# Patient Record
Sex: Female | Born: 1960
Health system: Southern US, Community
[De-identification: ages and names within clinical notes are randomized; demographics above are authoritative.]

## PROBLEM LIST (undated history)

## (undated) DIAGNOSIS — M1990 Unspecified osteoarthritis, unspecified site: Secondary | ICD-10-CM

## (undated) DIAGNOSIS — E079 Disorder of thyroid, unspecified: Secondary | ICD-10-CM

## (undated) DIAGNOSIS — N390 Urinary tract infection, site not specified: Secondary | ICD-10-CM

## (undated) DIAGNOSIS — G629 Polyneuropathy, unspecified: Secondary | ICD-10-CM

## (undated) DIAGNOSIS — C50919 Malignant neoplasm of unspecified site of unspecified female breast: Secondary | ICD-10-CM

## (undated) DIAGNOSIS — J40 Bronchitis, not specified as acute or chronic: Secondary | ICD-10-CM

## (undated) DIAGNOSIS — K219 Gastro-esophageal reflux disease without esophagitis: Secondary | ICD-10-CM

## (undated) HISTORY — DX: Urinary tract infection, site not specified: N39.0

## (undated) HISTORY — PX: TONSILLECTOMY: SUR1361

## (undated) HISTORY — DX: Disorder of thyroid, unspecified: E07.9

## (undated) HISTORY — PX: URETHRAL STRICTURE DILATATION: SHX477

---
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
## 1999-05-06 ENCOUNTER — Other Ambulatory Visit: Admission: RE | Admit: 1999-05-06 | Discharge: 1999-05-06 | Payer: Self-pay | Admitting: Obstetrics and Gynecology

## 1999-09-24 ENCOUNTER — Ambulatory Visit (HOSPITAL_COMMUNITY): Admission: RE | Admit: 1999-09-24 | Discharge: 1999-09-24 | Payer: Self-pay | Admitting: Obstetrics and Gynecology

## 1999-11-29 ENCOUNTER — Inpatient Hospital Stay (HOSPITAL_COMMUNITY): Admission: AD | Admit: 1999-11-29 | Discharge: 1999-12-01 | Payer: Self-pay | Admitting: Obstetrics and Gynecology

## 2000-01-03 ENCOUNTER — Other Ambulatory Visit: Admission: RE | Admit: 2000-01-03 | Discharge: 2000-01-03 | Payer: Self-pay | Admitting: Obstetrics and Gynecology

## 2000-07-10 ENCOUNTER — Other Ambulatory Visit: Admission: RE | Admit: 2000-07-10 | Discharge: 2000-07-10 | Payer: Self-pay | Admitting: Obstetrics and Gynecology

## 2001-01-23 ENCOUNTER — Other Ambulatory Visit: Admission: RE | Admit: 2001-01-23 | Discharge: 2001-01-23 | Payer: Self-pay | Admitting: Obstetrics and Gynecology

## 2002-01-28 ENCOUNTER — Other Ambulatory Visit: Admission: RE | Admit: 2002-01-28 | Discharge: 2002-01-28 | Payer: Self-pay | Admitting: Obstetrics and Gynecology

## 2002-12-13 ENCOUNTER — Other Ambulatory Visit: Admission: RE | Admit: 2002-12-13 | Discharge: 2002-12-13 | Payer: Self-pay | Admitting: Obstetrics and Gynecology

## 2004-01-26 ENCOUNTER — Other Ambulatory Visit: Admission: RE | Admit: 2004-01-26 | Discharge: 2004-01-26 | Payer: Self-pay | Admitting: Obstetrics and Gynecology

## 2005-02-24 ENCOUNTER — Other Ambulatory Visit: Admission: RE | Admit: 2005-02-24 | Discharge: 2005-02-24 | Payer: Self-pay | Admitting: Obstetrics and Gynecology

## 2005-03-09 ENCOUNTER — Encounter: Admission: RE | Admit: 2005-03-09 | Discharge: 2005-03-09 | Payer: Self-pay | Admitting: Obstetrics and Gynecology

## 2005-06-08 ENCOUNTER — Ambulatory Visit: Payer: Self-pay | Admitting: Internal Medicine

## 2007-05-11 ENCOUNTER — Encounter: Admission: RE | Admit: 2007-05-11 | Discharge: 2007-05-11 | Payer: Self-pay | Admitting: Obstetrics and Gynecology

## 2007-06-07 HISTORY — PX: SALIVARY STONE REMOVAL: SHX5213

## 2007-06-11 ENCOUNTER — Encounter: Admission: RE | Admit: 2007-06-11 | Discharge: 2007-06-11 | Payer: Self-pay | Admitting: Obstetrics and Gynecology

## 2007-06-11 ENCOUNTER — Encounter (INDEPENDENT_AMBULATORY_CARE_PROVIDER_SITE_OTHER): Payer: Self-pay | Admitting: Diagnostic Radiology

## 2007-08-03 ENCOUNTER — Ambulatory Visit: Payer: Self-pay | Admitting: Internal Medicine

## 2007-08-03 DIAGNOSIS — R220 Localized swelling, mass and lump, head: Secondary | ICD-10-CM | POA: Insufficient documentation

## 2007-08-03 DIAGNOSIS — F329 Major depressive disorder, single episode, unspecified: Secondary | ICD-10-CM

## 2007-08-03 DIAGNOSIS — R221 Localized swelling, mass and lump, neck: Secondary | ICD-10-CM

## 2007-09-03 ENCOUNTER — Telehealth (INDEPENDENT_AMBULATORY_CARE_PROVIDER_SITE_OTHER): Payer: Self-pay | Admitting: *Deleted

## 2010-06-27 ENCOUNTER — Encounter: Payer: Self-pay | Admitting: Obstetrics and Gynecology

## 2010-07-14 ENCOUNTER — Encounter: Payer: Self-pay | Admitting: Internal Medicine

## 2010-07-28 NOTE — Consult Note (Signed)
Summary: [facility]  [facility]   Imported By: Maryln Gottron 07/20/2010 09:38:25  _____________________________________________________________________  External Attachment:    Type:   Image     Comment:   External Document

## 2011-08-09 ENCOUNTER — Other Ambulatory Visit: Payer: Self-pay | Admitting: Obstetrics and Gynecology

## 2012-08-09 ENCOUNTER — Other Ambulatory Visit: Payer: Self-pay | Admitting: Obstetrics and Gynecology

## 2013-08-16 ENCOUNTER — Other Ambulatory Visit: Payer: Self-pay | Admitting: Obstetrics and Gynecology

## 2013-09-16 ENCOUNTER — Other Ambulatory Visit: Payer: Self-pay | Admitting: Obstetrics and Gynecology

## 2014-01-21 ENCOUNTER — Other Ambulatory Visit: Payer: Self-pay | Admitting: Obstetrics and Gynecology

## 2014-01-21 DIAGNOSIS — N63 Unspecified lump in unspecified breast: Secondary | ICD-10-CM

## 2014-01-23 ENCOUNTER — Ambulatory Visit
Admission: RE | Admit: 2014-01-23 | Discharge: 2014-01-23 | Disposition: A | Discharge status: Home / Self Care | Payer: BC Managed Care – PPO | Source: Ambulatory Visit | Attending: Obstetrics and Gynecology | Admitting: Obstetrics and Gynecology

## 2014-01-23 ENCOUNTER — Encounter (INDEPENDENT_AMBULATORY_CARE_PROVIDER_SITE_OTHER): Payer: Self-pay

## 2014-01-23 ENCOUNTER — Other Ambulatory Visit: Payer: Self-pay | Admitting: Obstetrics and Gynecology

## 2014-01-23 DIAGNOSIS — N63 Unspecified lump in unspecified breast: Secondary | ICD-10-CM

## 2014-01-24 ENCOUNTER — Other Ambulatory Visit: Payer: Self-pay | Admitting: Obstetrics and Gynecology

## 2014-01-24 DIAGNOSIS — C50911 Malignant neoplasm of unspecified site of right female breast: Secondary | ICD-10-CM

## 2014-01-30 ENCOUNTER — Encounter (INDEPENDENT_AMBULATORY_CARE_PROVIDER_SITE_OTHER): Payer: Self-pay | Admitting: General Surgery

## 2014-01-30 ENCOUNTER — Other Ambulatory Visit: Payer: Self-pay | Admitting: Obstetrics and Gynecology

## 2014-01-30 ENCOUNTER — Encounter (INDEPENDENT_AMBULATORY_CARE_PROVIDER_SITE_OTHER): Payer: BC Managed Care – PPO | Admitting: General Surgery

## 2014-01-30 ENCOUNTER — Ambulatory Visit (INDEPENDENT_AMBULATORY_CARE_PROVIDER_SITE_OTHER): Payer: BC Managed Care – PPO | Admitting: General Surgery

## 2014-01-30 VITALS — BP 114/70 | HR 75 | Temp 97.1°F | Ht 67.0 in | Wt 149.0 lb

## 2014-01-30 DIAGNOSIS — C50911 Malignant neoplasm of unspecified site of right female breast: Secondary | ICD-10-CM | POA: Insufficient documentation

## 2014-01-30 DIAGNOSIS — C50919 Malignant neoplasm of unspecified site of unspecified female breast: Secondary | ICD-10-CM

## 2014-01-30 NOTE — Progress Notes (Signed)
Chief Complaint: New diagnosis of breast cancer  History:    Olivia Acosta is a 53 y.o. postmenopausal female referred by Dr. Abelardo Diesel  for evaluation of recently diagnosed carcinoma of the right breast. She recently Felt a lump in the outer aspect of her right breast on monthly self-exam. She states this felt about as big as a pencil eraser. She had had a negative screening mammogram in March..  Subsequent imaging included diagnostic mamogram showing A spiculated mass in the area of palpable abnormality at the 7 to 8:00 position of the right breast and ultrasound showing A hypoechoic suspicious mass measuring 2.5 x 1.6 x 1.8 cm at the 7:00 position 5 cm from the nipple.   A ultrasound guided biopsy was performed on 01/23/2014 with pathology revealing Invasive mammary carcinoma with lobular features.  Breast MRI has been scheduled for tomorrow.. She is seen now in The office for initial treatment planning.  She has experienced a breast lump on exam and Possibly some skin dimpling in this area. No nipple discharge or pain.  She does have a personal history of A benign needle core biopsy of the left breast in the past.  Findings at that time were the following:  Tumor size: 2.5 cm  Tumor grade: 2  Estrogen Receptor: positive Progesterone Receptor: positive  Her-2 neu: negative  Breast MRI pending   History reviewed. No pertinent past medical history.  Past Surgical History  Procedure Laterality Date  . Tonsillectomy and adenoidectomy  1974  . Stricture of the uretha  1975    Current Outpatient Prescriptions  Medication Sig Dispense Refill  . ANGELIQ 0.5-1 MG per tablet       Note: This medication was discontinued at the time of her diagnosis No current facility-administered medications for this visit.    History reviewed. No pertinent family history.  History   Social History  . Marital Status: Widowed    Spouse Name: N/A    Number of Children: N/A  . Years of Education: N/A    Social History Main Topics  . Smoking status: Passive Smoke Exposure - Never Smoker  . Smokeless tobacco: None  . Alcohol Use: Yes  . Drug Use: No  . Sexual Activity: None   Other Topics Concern  . None   Social History Narrative  . None   Family history: Denies any family history of breast cancer.  Review of Systems A comprehensive review of systems was negative.     Objective:  BP 114/70  Pulse 75  Temp(Src) 97.1 F (36.2 C)  Ht 5' 7" (1.702 m)  Wt 149 lb (67.586 kg)  BMI 23.33 kg/m2  General: Alert, well-developed Caucasian female, in no distress Skin: Warm and dry without rash or infection. HEENT: No palpable masses or thyromegaly. Sclera nonicteric. Pupils equal round and reactive. Oropharynx clear. Breasts: Some bruising in the lower outer right breast. There is a small firm palpable mass which is less than 1 cm on exam at the 7:00 position. I cannot identify any definite skin dimpling. Right breast is noticeably smaller than the left. No palpable axillary adenopathy. Lymph nodes: No cervical, supraclavicular, or inguinal nodes palpable. Lungs: Breath sounds clear and equal without increased work of breathing Cardiovascular: Regular rate and rhythm without murmur. No JVD or edema. Peripheral pulses intact. Abdomen: Nondistended. Soft and nontender. No masses palpable. No organomegaly. No palpable hernias. Extremities: No edema or joint swelling or deformity. No chronic venous stasis changes. Neurologic: Alert and fully oriented. Gait normal.  Laboratory data:  CBC:  No results found for this basename: WBC, RBC, HGB, HCT, PLT  ]  CMG Labs:  No results found for this basename: GLUF, NA, K, CL, CO2, BUN, CREATININE, CALCIUM, PROT, ALB, BILITOT, BILIDIR, ALKPHOS, AST, ALT     Assessment  53 y.o. female with a new diagnosis of cancer of the the right breast lower outer quadrant.  Clinical IIA, estrogen receptor positive, progesterone receptor positive and  Her2/Neu protein/oncogene negative. I discussed with the patient and family members present today initial surgical treatment options. We discussed options of breast conservation with lumpectomy or total mastectomy and sentinal lymph node biopsy/dissection. Options for reconstruction were discussed. She understands that our discussion today is limited by the fact we did not yet have her MRI which I think is important with dense breasts and lobular cancer. However we discussed potential options assuming the MRI shows no more extensive disease than we are currently aware of. After discussion she would prefer breast conservation with lumpectomy. We discussed that the chance of positive margins is higher with lobular cancer and that her tumor to breast size with the fact that her right breast is already somewhat smaller could create some cosmetic deformity. Therefore tentative plan is for needle localized right breast lumpectomy with right axillary sentinel lymph node biopsy. Should she have more extensive disease on the MRI she may be a candidate for nipple sparing mastectomy and we discussed this possibility..  We discussed the indications and nature of the procedure, and expected recovery, in detail. Surgical risks including anesthetic complications, cardiorespiratory complications, bleeding, infection, wound healing complications, blood clots, lymphedema, local and distant recurrence and possible need for further surgery based on the final pathology was discussed and understood. I will be back in touch with her for further discussion and confirming the treatment plan after we have the results of her MRI. Chemotherapy, hormonal therapy and radiation therapy have been discussed. They have been provided with literature regarding the treatment of breast cancer.  All questions were answered.   Plan Tentatively planned breast conservation with needle localized right breast lumpectomy and right axillary sentinel lymph  node biopsy. This is pending the results of the MRI which we will discuss prior to scheduling  Edward Jolly MD, FACS  01/30/2014, 11:31 AM

## 2014-01-30 NOTE — Patient Instructions (Signed)
Our tentative plan discussed today is needle localized right breast lumpectomy with right axillary sentinel lymph node biopsy as initial surgical treatment. We will discuss results of your MRI on Tuesday. We will call you with the results tomorrow.

## 2014-01-31 ENCOUNTER — Telehealth (INDEPENDENT_AMBULATORY_CARE_PROVIDER_SITE_OTHER): Payer: Self-pay

## 2014-01-31 ENCOUNTER — Ambulatory Visit
Admission: RE | Admit: 2014-01-31 | Discharge: 2014-01-31 | Disposition: A | Discharge status: Home / Self Care | Payer: BC Managed Care – PPO | Source: Ambulatory Visit | Attending: Obstetrics and Gynecology | Admitting: Obstetrics and Gynecology

## 2014-01-31 DIAGNOSIS — C50911 Malignant neoplasm of unspecified site of right female breast: Secondary | ICD-10-CM

## 2014-01-31 LAB — CYTOLOGY - PAP

## 2014-01-31 MED ORDER — GADOBENATE DIMEGLUMINE 529 MG/ML IV SOLN
13.0000 mL | Freq: Once | INTRAVENOUS | Status: AC | PRN
  Administered 2014-01-31: 13 mL via INTRAVENOUS

## 2014-01-31 NOTE — Telephone Encounter (Signed)
Pt calling for MRI result. Pt advised report was just put in epic today and MD has not yet reviewed. Msg will be sent to Dr Excell Seltzer and his assistant to review and call pt on Monday. Pt can be reached at [phone].

## 2014-01-31 NOTE — Telephone Encounter (Signed)
Called and reviewed MRI results with patient as planned.  Patient aware that the Radiologist recommends MRI guided core biopsy of the most anterior nodule of the right breast to assess extent of disease.  Patient will await a call from Dr. Excell Seltzer to discuss treatment plan.  Will place order for MRI guided core biopsy and place on hold til recent MRI results are reviewed, patient aware and verbalized understanding

## 2014-01-31 NOTE — Addendum Note (Signed)
Addended by: Ivor Costa on: 01/31/2014 05:58 PM   Modules accepted: Orders

## 2014-02-03 ENCOUNTER — Other Ambulatory Visit: Payer: Self-pay | Admitting: Obstetrics and Gynecology

## 2014-02-03 DIAGNOSIS — R928 Other abnormal and inconclusive findings on diagnostic imaging of breast: Secondary | ICD-10-CM

## 2014-02-05 ENCOUNTER — Telehealth (INDEPENDENT_AMBULATORY_CARE_PROVIDER_SITE_OTHER): Payer: Self-pay | Admitting: General Surgery

## 2014-02-05 ENCOUNTER — Other Ambulatory Visit (INDEPENDENT_AMBULATORY_CARE_PROVIDER_SITE_OTHER): Payer: Self-pay

## 2014-02-05 DIAGNOSIS — C50919 Malignant neoplasm of unspecified site of unspecified female breast: Secondary | ICD-10-CM

## 2014-02-05 NOTE — Telephone Encounter (Signed)
Called pt and discussed MRI.  After our discussion she desires to proceed with total mastectomy.  Considering prophylactic contralateral mastectomy.  Possibly candidate   for nipple sparing.  All discussed with pt.  Refer to plastic surgery

## 2014-02-11 ENCOUNTER — Telehealth: Payer: Self-pay | Admitting: *Deleted

## 2014-02-11 ENCOUNTER — Encounter: Payer: Self-pay | Admitting: Internal Medicine

## 2014-02-11 NOTE — Telephone Encounter (Signed)
Called pt to offer navigation resources. Confirmed appt with Dr. Harlow Mares on 02/14/14. Pt is thinking of bilateral mastectomies with immediate reconstruction. Pt had several questions pertaining to recovery from surgery. Encouraged pt to write down all her questions for Dr. Harlow Mares. Pt denies further needs at this time. Gave pt contact information.

## 2014-02-14 ENCOUNTER — Other Ambulatory Visit: Payer: BC Managed Care – PPO

## 2014-02-18 ENCOUNTER — Telehealth: Payer: Self-pay | Admitting: *Deleted

## 2014-02-18 NOTE — Telephone Encounter (Signed)
Spoke to pt concerning time line of surgery and possibility of PET scan. Pt still deciding on single or bilateral mastectomy. She does have a call into Dr. Excell Seltzer to discuss and make her final decision. Informed pt that once she made her decision that it generally took 4-6 wks to schedule d/t finding OR time and availability of doctors. Discussed with pt the guidelines for PET scan and at this time she didn't need it according to her staging. Received verbal understanding. Encourage pt to call with needs, received verbal understanding.

## 2014-02-21 ENCOUNTER — Telehealth (INDEPENDENT_AMBULATORY_CARE_PROVIDER_SITE_OTHER): Payer: Self-pay | Admitting: General Surgery

## 2014-02-21 NOTE — Telephone Encounter (Signed)
Tried to call patient on cell phone and at home regarding surgery scheduling with no answer. Left a message.

## 2014-02-24 ENCOUNTER — Other Ambulatory Visit (INDEPENDENT_AMBULATORY_CARE_PROVIDER_SITE_OTHER): Payer: Self-pay | Admitting: General Surgery

## 2014-02-24 DIAGNOSIS — C50911 Malignant neoplasm of unspecified site of right female breast: Secondary | ICD-10-CM

## 2014-03-12 ENCOUNTER — Other Ambulatory Visit (INDEPENDENT_AMBULATORY_CARE_PROVIDER_SITE_OTHER): Payer: Self-pay | Admitting: General Surgery

## 2014-04-03 ENCOUNTER — Encounter (HOSPITAL_COMMUNITY): Payer: Self-pay | Admitting: Pharmacy Technician

## 2014-04-08 NOTE — Pre-Procedure Instructions (Addendum)
Olivia Acosta  04/08/2014   Your procedure is scheduled on:  04/17/14  Report to [facility] cone short stay admitting at 530 AM.  Call this number if you have problems the morning of surgery: [phone]   Remember:   Do not eat food or drink liquids after midnight.   Take these medicines the morning of surgery with A SIP OF WATER: xanax,valtrex if needed       STOP all herbel meds, nsaids (aleve,naproxen,advil,ibuprofen) 5 days prior to surgery including all vitamins, aspirin   Do not wear jewelry, make-up or nail polish.  Do not wear lotions, powders, or perfumes. You may wear deodorant.  Do not shave 48 hours prior to surgery. Men may shave face and neck.  Do not bring valuables to the hospital.  [facility] is not responsible                  for any belongings or valuables.               Contacts, dentures or bridgework may not be worn into surgery.  Leave suitcase in the car. After surgery it may be brought to your room.  For patients admitted to the hospital, discharge time is determined by your                treatment team.               Patients discharged the day of surgery will not be allowed to drive  home.  Name and phone number of your driver:   Special Instructions:  Special Instructions: [place] - Preparing for Surgery  Before surgery, you can play an important role.  Because skin is not sterile, your skin needs to be as free of germs as possible.  You can reduce the number of germs on you skin by washing with CHG (chlorahexidine gluconate) soap before surgery.  CHG is an antiseptic cleaner which kills germs and bonds with the skin to continue killing germs even after washing.  Please DO NOT use if you have an allergy to CHG or antibacterial soaps.  If your skin becomes reddened/irritated stop using the CHG and inform your nurse when you arrive at Short Stay.  Do not shave (including legs and underarms) for at least 48 hours prior to the first CHG shower.  You may  shave your face.  Please follow these instructions carefully:   1.  Shower with CHG Soap the night before surgery and the morning of Surgery.  2.  If you choose to wash your hair, wash your hair first as usual with your normal shampoo.  3.  After you shampoo, rinse your hair and body thoroughly to remove the Shampoo.  4.  Use CHG as you would any other liquid soap.  You can apply chg directly  to the skin and wash gently with scrungie or a clean washcloth.  5.  Apply the CHG Soap to your body ONLY FROM THE NECK DOWN.  Do not use on open wounds or open sores.  Avoid contact with your eyes ears, mouth and genitals (private parts).  Wash genitals (private parts)       with your normal soap.  6.  Wash thoroughly, paying special attention to the area where your surgery will be performed.  7.  Thoroughly rinse your body with warm water from the neck down.  8.  DO NOT shower/wash with your normal soap after using and rinsing off the CHG Soap.  9.  Pat yourself dry with a clean towel.            10.  Wear clean pajamas.            11.  Place clean sheets on your bed the night of your first shower and do not sleep with pets.  Day of Surgery  Do not apply any lotions/deodorants the morning of surgery.  Please wear clean clothes to the hospital/surgery center.   Please read over the following fact sheets that you were given: Pain Booklet, Coughing and Deep Breathing and Surgical Site Infection Prevention

## 2014-04-09 ENCOUNTER — Encounter (HOSPITAL_COMMUNITY): Payer: Self-pay

## 2014-04-09 ENCOUNTER — Encounter (HOSPITAL_COMMUNITY)
Admission: RE | Admit: 2014-04-09 | Discharge: 2014-04-09 | Disposition: A | Discharge status: Home / Self Care | Payer: BC Managed Care – PPO | Source: Ambulatory Visit | Attending: General Surgery | Admitting: General Surgery

## 2014-04-09 DIAGNOSIS — Z01812 Encounter for preprocedural laboratory examination: Secondary | ICD-10-CM | POA: Diagnosis not present

## 2014-04-09 LAB — BASIC METABOLIC PANEL
ANION GAP: 10 (ref 5–15)
BUN: 21 mg/dL (ref 6–23)
CHLORIDE: 103 meq/L (ref 96–112)
CO2: 27 mEq/L (ref 19–32)
CREATININE: 0.77 mg/dL (ref 0.50–1.10)
Calcium: 9.5 mg/dL (ref 8.4–10.5)
GFR calc Af Amer: >90 mL/min (ref >90)
GFR calc non Af Amer: >90 mL/min (ref >90)
GLUCOSE: 86 mg/dL (ref 70–99)
Potassium: 4.1 mEq/L (ref 3.7–5.3)
Sodium: 140 mEq/L (ref 137–147)

## 2014-04-09 LAB — CBC
HEMATOCRIT: 39.9 % (ref 36.0–46.0)
Hemoglobin: 13.5 g/dL (ref 12.0–15.0)
MCH: 31.1 pg (ref 26.0–34.0)
MCHC: 33.8 g/dL (ref 30.0–36.0)
MCV: 91.9 fL (ref 78.0–100.0)
PLATELETS: 177 10*3/uL (ref 150–400)
RBC: 4.34 MIL/uL (ref 3.87–5.11)
RDW: 13 % (ref 11.5–15.5)
WBC: 3.4 10*3/uL — AB (ref 4.0–10.5)

## 2014-04-14 ENCOUNTER — Other Ambulatory Visit: Payer: Self-pay | Admitting: Plastic Surgery

## 2014-04-16 MED ORDER — HEPARIN SODIUM (PORCINE) 5000 UNIT/ML IJ SOLN
5000.0000 [IU] | Freq: Once | INTRAMUSCULAR | Status: AC
  Administered 2014-04-17: 5000 [IU] via SUBCUTANEOUS
  Filled 2014-04-16: qty 1

## 2014-04-16 MED ORDER — CIPROFLOXACIN IN D5W 400 MG/200ML IV SOLN
400.0000 mg | INTRAVENOUS | Status: AC
  Administered 2014-04-17: 400 mg via INTRAVENOUS
  Filled 2014-04-16: qty 200

## 2014-04-17 ENCOUNTER — Inpatient Hospital Stay (HOSPITAL_COMMUNITY)
Admission: RE | Admit: 2014-04-17 | Discharge: 2014-04-19 | DRG: 580 | Disposition: A | Discharge status: Home / Self Care | Payer: BC Managed Care – PPO | Source: Ambulatory Visit | Attending: Plastic Surgery | Admitting: Plastic Surgery

## 2014-04-17 ENCOUNTER — Encounter (HOSPITAL_COMMUNITY)
Admission: RE | Disposition: A | Discharge status: Home / Self Care | Payer: Self-pay | Source: Ambulatory Visit | Attending: Plastic Surgery

## 2014-04-17 ENCOUNTER — Encounter (HOSPITAL_COMMUNITY): Payer: Self-pay | Admitting: *Deleted

## 2014-04-17 ENCOUNTER — Ambulatory Visit (HOSPITAL_COMMUNITY)
Admission: RE | Admit: 2014-04-17 | Discharge: 2014-04-17 | Disposition: A | Discharge status: Home / Self Care | Payer: BC Managed Care – PPO | Source: Ambulatory Visit | Attending: General Surgery | Admitting: General Surgery

## 2014-04-17 ENCOUNTER — Inpatient Hospital Stay (HOSPITAL_COMMUNITY): Payer: BC Managed Care – PPO | Admitting: Certified Registered"

## 2014-04-17 DIAGNOSIS — Z170 Estrogen receptor positive status [ER+]: Secondary | ICD-10-CM | POA: Diagnosis not present

## 2014-04-17 DIAGNOSIS — C50511 Malignant neoplasm of lower-outer quadrant of right female breast: Principal | ICD-10-CM | POA: Diagnosis present

## 2014-04-17 DIAGNOSIS — C773 Secondary and unspecified malignant neoplasm of axilla and upper limb lymph nodes: Secondary | ICD-10-CM | POA: Diagnosis present

## 2014-04-17 DIAGNOSIS — C50911 Malignant neoplasm of unspecified site of right female breast: Secondary | ICD-10-CM

## 2014-04-17 DIAGNOSIS — Z7722 Contact with and (suspected) exposure to environmental tobacco smoke (acute) (chronic): Secondary | ICD-10-CM | POA: Diagnosis present

## 2014-04-17 HISTORY — PX: BREAST RECONSTRUCTION: SHX9

## 2014-04-17 HISTORY — PX: BREAST RECONSTRUCTION WITH PLACEMENT OF TISSUE EXPANDER AND FLEX HD (ACELLULAR HYDRATED DERMIS): SHX6295

## 2014-04-17 HISTORY — PX: COMPLETE MASTECTOMY W/ SENTINEL NODE BIOPSY: SHX1383

## 2014-04-17 HISTORY — PX: MASTECTOMY W/ SENTINEL NODE BIOPSY: SHX2001

## 2014-04-17 SURGERY — BREAST RECONSTRUCTION WITH PLACEMENT OF TISSUE EXPANDER AND FLEX HD (ACELLULAR HYDRATED DERMIS)
Anesthesia: General | Site: Breast | Laterality: Bilateral

## 2014-04-17 MED ORDER — SODIUM CHLORIDE 0.9 % IR SOLN
Status: DC | PRN
  Administered 2014-04-17: 500 mL
  Administered 2014-04-17: 1000 mL

## 2014-04-17 MED ORDER — OXYCODONE HCL 5 MG PO TABS
5.0000 mg | ORAL_TABLET | Freq: Once | ORAL | Status: AC | PRN
  Administered 2014-04-17: 5 mg via ORAL

## 2014-04-17 MED ORDER — SODIUM CHLORIDE 0.9 % IJ SOLN
INTRAMUSCULAR | Status: AC
  Filled 2014-04-17: qty 10

## 2014-04-17 MED ORDER — LACTATED RINGERS IV SOLN
INTRAVENOUS | Status: DC | PRN
  Administered 2014-04-17 (×3): via INTRAVENOUS

## 2014-04-17 MED ORDER — SUCCINYLCHOLINE CHLORIDE 20 MG/ML IJ SOLN
INTRAMUSCULAR | Status: AC
  Filled 2014-04-17: qty 1

## 2014-04-17 MED ORDER — EPHEDRINE SULFATE 50 MG/ML IJ SOLN
INTRAMUSCULAR | Status: AC
  Filled 2014-04-17: qty 1

## 2014-04-17 MED ORDER — CHLORHEXIDINE GLUCONATE 4 % EX LIQD
1.0000 "application " | Freq: Once | CUTANEOUS | Status: DC
  Filled 2014-04-17: qty 15

## 2014-04-17 MED ORDER — ROCURONIUM BROMIDE 50 MG/5ML IV SOLN
INTRAVENOUS | Status: AC
  Filled 2014-04-17: qty 1

## 2014-04-17 MED ORDER — DEXTROSE-NACL 5-0.45 % IV SOLN
INTRAVENOUS | Status: DC
  Administered 2014-04-17 – 2014-04-18 (×2): via INTRAVENOUS

## 2014-04-17 MED ORDER — MIDAZOLAM HCL 2 MG/2ML IJ SOLN
INTRAMUSCULAR | Status: AC
  Filled 2014-04-17: qty 2

## 2014-04-17 MED ORDER — PROPOFOL 10 MG/ML IV BOLUS
INTRAVENOUS | Status: DC | PRN
  Administered 2014-04-17: 200 mg via INTRAVENOUS

## 2014-04-17 MED ORDER — METHOCARBAMOL 500 MG PO TABS
ORAL_TABLET | ORAL | Status: AC
  Filled 2014-04-17: qty 1

## 2014-04-17 MED ORDER — FENTANYL CITRATE 0.05 MG/ML IJ SOLN
INTRAMUSCULAR | Status: DC | PRN
  Administered 2014-04-17: 50 ug via INTRAVENOUS
  Administered 2014-04-17: 25 ug via INTRAVENOUS
  Administered 2014-04-17 (×2): 50 ug via INTRAVENOUS
  Administered 2014-04-17: 150 ug via INTRAVENOUS
  Administered 2014-04-17 (×2): 50 ug via INTRAVENOUS
  Administered 2014-04-17: 25 ug via INTRAVENOUS
  Administered 2014-04-17 (×2): 50 ug via INTRAVENOUS

## 2014-04-17 MED ORDER — ONDANSETRON HCL 4 MG/2ML IJ SOLN
INTRAMUSCULAR | Status: DC | PRN
  Administered 2014-04-17: 4 mg via INTRAVENOUS

## 2014-04-17 MED ORDER — FENTANYL CITRATE 0.05 MG/ML IJ SOLN
INTRAMUSCULAR | Status: AC
  Filled 2014-04-17: qty 5

## 2014-04-17 MED ORDER — NEOSTIGMINE METHYLSULFATE 10 MG/10ML IV SOLN
INTRAVENOUS | Status: AC
  Filled 2014-04-17: qty 1

## 2014-04-17 MED ORDER — LIDOCAINE HCL (CARDIAC) 20 MG/ML IV SOLN
INTRAVENOUS | Status: AC
  Filled 2014-04-17: qty 5

## 2014-04-17 MED ORDER — VECURONIUM BROMIDE 10 MG IV SOLR
INTRAVENOUS | Status: AC
  Filled 2014-04-17: qty 10

## 2014-04-17 MED ORDER — DOCUSATE SODIUM 100 MG PO CAPS
100.0000 mg | ORAL_CAPSULE | Freq: Every day | ORAL | Status: DC
  Administered 2014-04-18 – 2014-04-19 (×2): 100 mg via ORAL
  Filled 2014-04-17 (×2): qty 1

## 2014-04-17 MED ORDER — MORPHINE SULFATE 2 MG/ML IJ SOLN
1.0000 mg | INTRAMUSCULAR | Status: DC | PRN
  Administered 2014-04-17 – 2014-04-18 (×5): 2 mg via INTRAVENOUS
  Filled 2014-04-17 (×5): qty 1

## 2014-04-17 MED ORDER — PROPOFOL 10 MG/ML IV BOLUS
INTRAVENOUS | Status: AC
  Filled 2014-04-17: qty 20

## 2014-04-17 MED ORDER — HEPARIN SODIUM (PORCINE) 5000 UNIT/ML IJ SOLN
5000.0000 [IU] | Freq: Three times a day (TID) | INTRAMUSCULAR | Status: DC
  Administered 2014-04-18 – 2014-04-19 (×4): 5000 [IU] via SUBCUTANEOUS
  Filled 2014-04-17 (×7): qty 1

## 2014-04-17 MED ORDER — METHOCARBAMOL 500 MG PO TABS
500.0000 mg | ORAL_TABLET | Freq: Four times a day (QID) | ORAL | Status: DC
  Administered 2014-04-17 – 2014-04-19 (×8): 500 mg via ORAL
  Filled 2014-04-17 (×9): qty 1

## 2014-04-17 MED ORDER — CIPROFLOXACIN IN D5W 400 MG/200ML IV SOLN
400.0000 mg | Freq: Two times a day (BID) | INTRAVENOUS | Status: DC
  Administered 2014-04-17 – 2014-04-18 (×2): 400 mg via INTRAVENOUS
  Filled 2014-04-17 (×4): qty 200

## 2014-04-17 MED ORDER — GLYCOPYRROLATE 0.2 MG/ML IJ SOLN
INTRAMUSCULAR | Status: AC
  Filled 2014-04-17: qty 3

## 2014-04-17 MED ORDER — OXYCODONE HCL 5 MG PO TABS
ORAL_TABLET | ORAL | Status: AC
  Filled 2014-04-17: qty 1

## 2014-04-17 MED ORDER — GLYCOPYRROLATE 0.2 MG/ML IJ SOLN
INTRAMUSCULAR | Status: DC | PRN
  Administered 2014-04-17: 0.6 mg via INTRAVENOUS

## 2014-04-17 MED ORDER — OXYCODONE HCL 5 MG/5ML PO SOLN: 5.0000 mg | Freq: Once | ORAL | Status: AC | PRN

## 2014-04-17 MED ORDER — VECURONIUM BROMIDE 10 MG IV SOLR
INTRAVENOUS | Status: DC | PRN
  Administered 2014-04-17 (×3): 2 mg via INTRAVENOUS
  Administered 2014-04-17 (×2): 3 mg via INTRAVENOUS
  Administered 2014-04-17 (×2): 2 mg via INTRAVENOUS
  Administered 2014-04-17: 4 mg via INTRAVENOUS
  Administered 2014-04-17: 2 mg via INTRAVENOUS

## 2014-04-17 MED ORDER — STERILE WATER FOR INJECTION IJ SOLN
INTRAMUSCULAR | Status: AC
  Filled 2014-04-17: qty 10

## 2014-04-17 MED ORDER — ARTIFICIAL TEARS OP OINT
TOPICAL_OINTMENT | OPHTHALMIC | Status: AC
  Filled 2014-04-17: qty 3.5

## 2014-04-17 MED ORDER — ALPRAZOLAM 0.5 MG PO TABS: 0.5000 mg | ORAL_TABLET | Freq: Every day | ORAL | Status: DC | PRN

## 2014-04-17 MED ORDER — DIPHENHYDRAMINE HCL 50 MG/ML IJ SOLN
INTRAMUSCULAR | Status: AC
  Filled 2014-04-17: qty 1

## 2014-04-17 MED ORDER — PROMETHAZINE HCL 25 MG/ML IJ SOLN: 6.2500 mg | INTRAMUSCULAR | Status: DC | PRN

## 2014-04-17 MED ORDER — PROMETHAZINE HCL 25 MG/ML IJ SOLN
6.2500 mg | INTRAMUSCULAR | Status: DC | PRN
  Administered 2014-04-17: 6.25 mg via INTRAVENOUS
  Filled 2014-04-17: qty 1

## 2014-04-17 MED ORDER — SCOPOLAMINE 1 MG/3DAYS TD PT72
1.0000 | MEDICATED_PATCH | Freq: Once | TRANSDERMAL | Status: AC
  Administered 2014-04-17: 1 via TRANSDERMAL

## 2014-04-17 MED ORDER — SODIUM CHLORIDE 0.9 % IR SOLN
Status: DC | PRN
  Administered 2014-04-17 (×2): 500 mL

## 2014-04-17 MED ORDER — LIDOCAINE HCL (CARDIAC) 20 MG/ML IV SOLN
INTRAVENOUS | Status: DC | PRN
  Administered 2014-04-17: 30 mg via INTRAVENOUS

## 2014-04-17 MED ORDER — VALACYCLOVIR HCL 500 MG PO TABS
500.0000 mg | ORAL_TABLET | Freq: Every day | ORAL | Status: DC
  Administered 2014-04-18 – 2014-04-19 (×2): 500 mg via ORAL
  Filled 2014-04-17 (×3): qty 1

## 2014-04-17 MED ORDER — MIDAZOLAM HCL 2 MG/2ML IJ SOLN: 0.5000 mg | Freq: Once | INTRAMUSCULAR | Status: DC | PRN

## 2014-04-17 MED ORDER — SCOPOLAMINE 1 MG/3DAYS TD PT72
MEDICATED_PATCH | TRANSDERMAL | Status: AC
  Filled 2014-04-17: qty 1

## 2014-04-17 MED ORDER — ARTIFICIAL TEARS OP OINT
TOPICAL_OINTMENT | OPHTHALMIC | Status: DC | PRN
  Administered 2014-04-17: 1 via OPHTHALMIC

## 2014-04-17 MED ORDER — PHENYLEPHRINE 40 MCG/ML (10ML) SYRINGE FOR IV PUSH (FOR BLOOD PRESSURE SUPPORT)
PREFILLED_SYRINGE | INTRAVENOUS | Status: AC
  Filled 2014-04-17: qty 10

## 2014-04-17 MED ORDER — HYDROMORPHONE HCL 2 MG PO TABS
2.0000 mg | ORAL_TABLET | ORAL | Status: DC | PRN
  Administered 2014-04-18 – 2014-04-19 (×8): 4 mg via ORAL
  Filled 2014-04-17 (×8): qty 2

## 2014-04-17 MED ORDER — MIDAZOLAM HCL 5 MG/5ML IJ SOLN
INTRAMUSCULAR | Status: DC | PRN
  Administered 2014-04-17 (×2): 1 mg via INTRAVENOUS

## 2014-04-17 MED ORDER — HYDROMORPHONE HCL 1 MG/ML IJ SOLN
INTRAMUSCULAR | Status: AC
  Filled 2014-04-17: qty 1

## 2014-04-17 MED ORDER — NEOSTIGMINE METHYLSULFATE 10 MG/10ML IV SOLN
INTRAVENOUS | Status: DC | PRN
  Administered 2014-04-17: 4 mg via INTRAVENOUS

## 2014-04-17 MED ORDER — DEXAMETHASONE SODIUM PHOSPHATE 4 MG/ML IJ SOLN
INTRAMUSCULAR | Status: DC | PRN
  Administered 2014-04-17: 4 mg via INTRAVENOUS

## 2014-04-17 MED ORDER — HYDROMORPHONE HCL 1 MG/ML IJ SOLN
0.2500 mg | INTRAMUSCULAR | Status: DC | PRN
  Administered 2014-04-17: 0.5 mg via INTRAVENOUS

## 2014-04-17 MED ORDER — ONDANSETRON HCL 4 MG/2ML IJ SOLN
INTRAMUSCULAR | Status: AC
  Filled 2014-04-17: qty 2

## 2014-04-17 MED ORDER — 0.9 % SODIUM CHLORIDE (POUR BTL) OPTIME
TOPICAL | Status: DC | PRN
  Administered 2014-04-17: 1000 mL
  Administered 2014-04-17: 2000 mL
  Administered 2014-04-17: 1000 mL

## 2014-04-17 MED ORDER — PHENYLEPHRINE HCL 10 MG/ML IJ SOLN
INTRAMUSCULAR | Status: DC | PRN
  Administered 2014-04-17: 40 ug via INTRAVENOUS

## 2014-04-17 MED ORDER — DIPHENHYDRAMINE HCL 50 MG/ML IJ SOLN
10.0000 mg | Freq: Once | INTRAMUSCULAR | Status: AC
  Administered 2014-04-17: 10 mg via INTRAVENOUS

## 2014-04-17 MED ORDER — METHYLENE BLUE 1 % INJ SOLN
INTRAMUSCULAR | Status: AC
Start: 2014-04-17 — End: 2014-04-17
  Filled 2014-04-17: qty 10

## 2014-04-17 MED ORDER — TECHNETIUM TC 99M SULFUR COLLOID FILTERED
1.0000 | Freq: Once | INTRAVENOUS | Status: AC | PRN
  Administered 2014-04-17: 1 via INTRADERMAL

## 2014-04-17 MED ORDER — DEXAMETHASONE SODIUM PHOSPHATE 4 MG/ML IJ SOLN
INTRAMUSCULAR | Status: AC
  Filled 2014-04-17: qty 1

## 2014-04-17 MED ORDER — ROCURONIUM BROMIDE 100 MG/10ML IV SOLN
INTRAVENOUS | Status: DC | PRN
  Administered 2014-04-17: 50 mg via INTRAVENOUS

## 2014-04-17 SURGICAL SUPPLY — 97 items
ADH SKN CLS APL DERMABOND .7 (GAUZE/BANDAGES/DRESSINGS) ×2
ALLODERM 8X16 READY TO USE (Tissue) ×1 IMPLANT
ALLODERM RTU 8X16 (Tissue) ×2 IMPLANT
APPLIER CLIP 11 MED OPEN (CLIP) ×3
APPLIER CLIP 9.375 MED OPEN (MISCELLANEOUS)
APR CLP MED 11 20 MLT OPN (CLIP) ×2
APR CLP MED 9.3 20 MLT OPN (MISCELLANEOUS)
ATCH SMKEVC FLXB CAUT HNDSWH (FILTER) ×2 IMPLANT
BAG DECANTER FOR FLEXI CONT (MISCELLANEOUS) ×4 IMPLANT
BINDER BREAST LRG (GAUZE/BANDAGES/DRESSINGS) IMPLANT
BINDER BREAST MEDIUM (GAUZE/BANDAGES/DRESSINGS) ×1 IMPLANT
BINDER BREAST XLRG (GAUZE/BANDAGES/DRESSINGS) ×1 IMPLANT
BIOPATCH RED 1 DISK 7.0 (GAUZE/BANDAGES/DRESSINGS) ×6 IMPLANT
BLADE 10 SAFETY STRL DISP (BLADE) ×3 IMPLANT
BLADE SURG 15 STRL LF DISP TIS (BLADE) IMPLANT
BLADE SURG 15 STRL SS (BLADE) ×3
CANISTER SUCTION 2500CC (MISCELLANEOUS) ×9 IMPLANT
CHLORAPREP W/TINT 26ML (MISCELLANEOUS) ×6 IMPLANT
CLIP APPLIE 11 MED OPEN (CLIP) IMPLANT
CLIP APPLIE 9.375 MED OPEN (MISCELLANEOUS) IMPLANT
CLIP TI MEDIUM 6 (CLIP) ×3 IMPLANT
CONT SPEC 4OZ CLIKSEAL STRL BL (MISCELLANEOUS) ×9 IMPLANT
COVER PROBE W GEL 5X96 (DRAPES) ×3 IMPLANT
COVER SURGICAL LIGHT HANDLE (MISCELLANEOUS) ×7 IMPLANT
DERMABOND ADVANCED (GAUZE/BANDAGES/DRESSINGS) ×1
DERMABOND ADVANCED .7 DNX12 (GAUZE/BANDAGES/DRESSINGS) ×4 IMPLANT
DEVICE DISSECT PLASMABLAD 3.0S (MISCELLANEOUS) ×2 IMPLANT
DRAIN CHANNEL 19F RND (DRAIN) ×10 IMPLANT
DRAPE CHEST BREAST 15X10 FENES (DRAPES) ×3 IMPLANT
DRAPE ORTHO SPLIT 77X108 STRL (DRAPES) ×6
DRAPE PROXIMA HALF (DRAPES) ×9 IMPLANT
DRAPE SURG 17X23 STRL (DRAPES) ×6 IMPLANT
DRAPE SURG ORHT 6 SPLT 77X108 (DRAPES) ×4 IMPLANT
DRAPE UTILITY 15X26 W/TAPE STR (DRAPE) ×6 IMPLANT
DRAPE WARM FLUID 44X44 (DRAPE) ×3 IMPLANT
DRSG OPSITE 4X5.5 SM (GAUZE/BANDAGES/DRESSINGS) IMPLANT
DRSG PAD ABDOMINAL 8X10 ST (GAUZE/BANDAGES/DRESSINGS) ×6 IMPLANT
DRSG SORBAVIEW 3.5X5-5/16 MED (GAUZE/BANDAGES/DRESSINGS) ×6 IMPLANT
DRSG TEGADERM 4X4.75 (GAUZE/BANDAGES/DRESSINGS) ×2 IMPLANT
ELECT BLADE 6.5 EXT (BLADE) ×1 IMPLANT
ELECT CAUTERY BLADE 6.4 (BLADE) ×6 IMPLANT
ELECT REM PT RETURN 9FT ADLT (ELECTROSURGICAL) ×9
ELECTRODE REM PT RTRN 9FT ADLT (ELECTROSURGICAL) ×6 IMPLANT
EVACUATOR SILICONE 100CC (DRAIN) ×10 IMPLANT
EVACUATOR SMOKE ACCUVAC VALLEY (FILTER) ×1
GLOVE BIO SURGEON STRL SZ7 (GLOVE) ×2 IMPLANT
GLOVE BIO SURGEON STRL SZ7.5 (GLOVE) ×5 IMPLANT
GLOVE BIOGEL PI IND STRL 7.0 (GLOVE) IMPLANT
GLOVE BIOGEL PI IND STRL 7.5 (GLOVE) IMPLANT
GLOVE BIOGEL PI IND STRL 8 (GLOVE) ×4 IMPLANT
GLOVE BIOGEL PI INDICATOR 7.0 (GLOVE) ×5
GLOVE BIOGEL PI INDICATOR 7.5 (GLOVE) ×4
GLOVE BIOGEL PI INDICATOR 8 (GLOVE) ×2
GLOVE ECLIPSE 7.5 STRL STRAW (GLOVE) ×1 IMPLANT
GLOVE SS BIOGEL STRL SZ 7.5 (GLOVE) ×2 IMPLANT
GLOVE SUPERSENSE BIOGEL SZ 7.5 (GLOVE) ×2
GLOVE SURG SS PI 7.0 STRL IVOR (GLOVE) ×7 IMPLANT
GOWN STRL REUS W/ TWL LRG LVL3 (GOWN DISPOSABLE) ×6 IMPLANT
GOWN STRL REUS W/ TWL XL LVL3 (GOWN DISPOSABLE) ×4 IMPLANT
GOWN STRL REUS W/TWL LRG LVL3 (GOWN DISPOSABLE) ×18
GOWN STRL REUS W/TWL XL LVL3 (GOWN DISPOSABLE) ×6
GRAFT FLEX HD 8X16 THICK (Tissue Mesh) ×1 IMPLANT
IMPL BREAST TIS EXP M 650CC (Breast) IMPLANT
IMPLANT BREAST TIS EXP M 650CC (Breast) ×6 IMPLANT
KIT BASIN OR (CUSTOM PROCEDURE TRAY) ×6 IMPLANT
KIT ROOM TURNOVER OR (KITS) ×6 IMPLANT
MARKER SKIN DUAL TIP RULER LAB (MISCELLANEOUS) ×3 IMPLANT
NDL 18GX1X1/2 (RX/OR ONLY) (NEEDLE) ×2 IMPLANT
NDL HYPO 25GX1X1/2 BEV (NEEDLE) ×2 IMPLANT
NEEDLE 18GX1X1/2 (RX/OR ONLY) (NEEDLE) ×3 IMPLANT
NEEDLE HYPO 25GX1X1/2 BEV (NEEDLE) ×3 IMPLANT
NS IRRIG 1000ML POUR BTL (IV SOLUTION) ×9 IMPLANT
PACK GENERAL/GYN (CUSTOM PROCEDURE TRAY) ×6 IMPLANT
PAD ARMBOARD 7.5X6 YLW CONV (MISCELLANEOUS) ×6 IMPLANT
PENCIL BUTTON HOLSTER BLD 10FT (ELECTRODE) ×1 IMPLANT
PLASMABLADE 3.0S (MISCELLANEOUS) ×3
PREFILTER EVAC NS 1 1/3-3/8IN (MISCELLANEOUS) ×3 IMPLANT
SPECIMEN JAR X LARGE (MISCELLANEOUS) ×3 IMPLANT
SPONGE LAP 18X18 X RAY DECT (DISPOSABLE) ×6 IMPLANT
STAPLER VISISTAT 35W (STAPLE) ×1 IMPLANT
SUT ETHILON 2 0 FS 18 (SUTURE) ×3 IMPLANT
SUT MNCRL AB 3-0 PS2 18 (SUTURE) ×12 IMPLANT
SUT MNCRL AB 4-0 PS2 18 (SUTURE) ×3 IMPLANT
SUT PDS AB 3-0 SH 27 (SUTURE) ×4 IMPLANT
SUT PROLENE 3 0 PS 2 (SUTURE) ×10 IMPLANT
SUT VIC AB 3-0 54X BRD REEL (SUTURE) ×2 IMPLANT
SUT VIC AB 3-0 BRD 54 (SUTURE) ×3
SUT VIC AB 3-0 SH 18 (SUTURE) ×6 IMPLANT
SYR BULB IRRIGATION 50ML (SYRINGE) ×3 IMPLANT
SYR CONTROL 10ML LL (SYRINGE) ×3 IMPLANT
TAPE STRIPS DRAPE STRL (GAUZE/BANDAGES/DRESSINGS) ×3 IMPLANT
TISSUE ALLDRM RTU 8X16 (Tissue) IMPLANT
TOWEL OR 17X24 6PK STRL BLUE (TOWEL DISPOSABLE) ×6 IMPLANT
TOWEL OR 17X26 10 PK STRL BLUE (TOWEL DISPOSABLE) ×6 IMPLANT
TRAY FOLEY CATH 14FRSI W/METER (CATHETERS) IMPLANT
TUBE CONNECTING 12X1/4 (SUCTIONS) ×10 IMPLANT
YANKAUER SUCT BULB TIP NO VENT (SUCTIONS) ×6 IMPLANT

## 2014-04-17 NOTE — Transfer of Care (Signed)
Immediate Anesthesia Transfer of Care Note  Patient: Olivia Acosta  Procedure(s) Performed: Procedure(s): BILATERAL BREAST RECONSTRUCTION WITH PLACEMENT OF TISSUE EXPANDER AND FLEX HD (ACELLULAR HYDRATED DERMIS) (Bilateral) BILATERAL NIPPLE SPARING MASTECTOMY WITH SENTINEL LYMPH NODE BIOPSY (Bilateral)  Patient Location: PACU  Anesthesia Type:General  Level of Consciousness: awake  Airway & Oxygen Therapy: Patient Spontanous Breathing and Patient connected to nasal cannula oxygen  Post-op Assessment: Report given to PACU RN  Post vital signs: Reviewed and stable  Complications: No apparent anesthesia complications

## 2014-04-17 NOTE — Brief Op Note (Signed)
04/17/2014  2:45 PM  PATIENT:  Olivia Acosta  53 y.o. female  PRE-OPERATIVE DIAGNOSIS:  BREAST CANCER  POST-OPERATIVE DIAGNOSIS:  BREAST CANCER  PROCEDURE:  Procedure(s): BILATERAL BREAST RECONSTRUCTION WITH PLACEMENT OF TISSUE EXPANDER AND FLEX HD (ACELLULAR HYDRATED DERMIS) (Bilateral) BILATERAL NIPPLE SPARING MASTECTOMY WITH SENTINEL LYMPH NODE BIOPSY (Bilateral)  SURGEON:  Surgeon(s) and Role: Panel 1:    * Crissie Reese, MD - Primary  Panel 2:    * Excell Seltzer, MD - Primary    * Rolm Bookbinder, MD - Assisting  PHYSICIAN ASSISTANT:   ASSISTANTS: none   ANESTHESIA:   general  EBL:  Total I/O In: 2800 [I.V.:2800] Out: 795 [Urine:495; Blood:300]  BLOOD ADMINISTERED:none  DRAINS: (4) [name]-Pratt drain(s) with closed bulb suction in the left mastectomy space (2) and right mastectomy space (2)   LOCAL MEDICATIONS USED:  NONE  SPECIMEN:  No Specimen  DISPOSITION OF SPECIMEN:  N/A  COUNTS:  YES  TOURNIQUET:  * No tourniquets in log *  DICTATION: .Other Dictation: Dictation Number [id number]  PLAN OF CARE: Admit to inpatient   PATIENT DISPOSITION:  PACU - hemodynamically stable.   Delay start of Pharmacological VTE agent (>24hrs) due to surgical blood loss or risk of bleeding: no

## 2014-04-17 NOTE — Anesthesia Preprocedure Evaluation (Addendum)
Anesthesia Evaluation  Patient identified by MRN, date of birth, ID band Patient awake    Reviewed: Allergy & Precautions, H&P , NPO status , Patient's Chart, lab work & pertinent test results  History of Anesthesia Complications Negative for: history of anesthetic complications  Airway Mallampati: II  TM Distance: >3 FB Neck ROM: Full    Dental  (+) Teeth Intact, Dental Advisory Given   Pulmonary neg pulmonary ROS,  breath sounds clear to auscultation        Cardiovascular negative cardio ROS  Rhythm:Regular Rate:Normal     Neuro/Psych Depression negative neurological ROS     GI/Hepatic negative GI ROS, Neg liver ROS,   Endo/Other  negative endocrine ROS  Renal/GU negative Renal ROS     Musculoskeletal   Abdominal   Peds  Hematology negative hematology ROS (+)   Anesthesia Other Findings Breast cancer  Reproductive/Obstetrics                            Anesthesia Physical Anesthesia Plan  ASA: II  Anesthesia Plan: General   Post-op Pain Management:    Induction: Intravenous  Airway Management Planned: Oral ETT  Additional Equipment:   Intra-op Plan:   Post-operative Plan: Extubation in OR  Informed Consent: I have reviewed the patients History and Physical, chart, labs and discussed the procedure including the risks, benefits and alternatives for the proposed anesthesia with the patient or authorized representative who has indicated his/her understanding and acceptance.   Dental advisory given  Plan Discussed with: CRNA and Surgeon  Anesthesia Plan Comments: (Plan routine monitors, GETA)        Anesthesia Quick Evaluation

## 2014-04-17 NOTE — H&P (Signed)
History:    Olivia Acosta is a 53 y.o. postmenopausal female referred by Dr. Sherian Rein for evaluation of recently diagnosed carcinoma of the right breast. She recently Felt a lump in the outer aspect of her right breast on monthly self-exam. She states this felt about as big as a pencil eraser. She had had a negative screening mammogram in March.. Subsequent imaging included diagnostic mamogram showing A spiculated mass in the area of palpable abnormality at the 7 to 8:00 position of the right breast and ultrasound showing A hypoechoic suspicious mass measuring 2.5 x 1.6 x 1.8 cm at the 7:00 position 5 cm from the nipple. A ultrasound guided biopsy was performed on 01/23/2014 with pathology revealing Invasive mammary carcinoma with lobular features. Breast MRI Was positive for tumor in the same area but some additional anterior nodules with enhancement extending for a total distance of about 5 cm.. After extensive preoperative discussion and consultation including with plastic surgery the patient has elected to proceed with bilateral total mastectomy with sentinel lymph node biopsy.  Findings at that time were the following:  Tumor size: 2.5 cm  Tumor grade: 2  Estrogen Receptor: positive Progesterone Receptor: positive  Her-2 neu: negative  Breast MRI pending   History reviewed. No pertinent past medical history.  Past Surgical History  Procedure Laterality Date  . Tonsillectomy and adenoidectomy  1974  . Stricture of the uretha  1975    Current Outpatient Prescriptions  Medication Sig Dispense Refill  . ANGELIQ 0.5-1 MG per tablet     Note: This medication was discontinued at the time of her diagnosis No current facility-administered medications for this visit.    History reviewed. No pertinent family history.  History   Social History  . Marital Status: Widowed    Spouse Name: N/A    Number of Children: N/A  .  Years of Education: N/A   Social History Main Topics  . Smoking status: Passive Smoke Exposure - Never Smoker  . Smokeless tobacco: None  . Alcohol Use: Yes  . Drug Use: No  . Sexual Activity: None   Other Topics Concern  . None   Social History Narrative  . None   Family history: Denies any family history of breast cancer.  Review of Systems A comprehensive review of systems was negative.    Objective:  BP 103/56 mmHg  Pulse 65  Temp(Src) 98.4 F (36.9 C) (Oral)  Resp 20  Ht 5\' 7"  (1.702 m)  Wt 151 lb (68.493 kg)  BMI 23.64 kg/m2  SpO2 100%   General: Alert, well-developed Caucasian female, in no distress Skin: Warm and dry without rash or infection. HEENT: No palpable masses or thyromegaly. Sclera nonicteric. Pupils equal round and reactive. Oropharynx clear. Breasts: Some bruising in the lower outer right breast. There is a small firm palpable mass which is less than 1 cm on exam at the 7:00 position. I cannot identify any definite skin dimpling. Right breast is noticeably smaller than the left. No palpable axillary adenopathy. Lymph nodes: No cervical, supraclavicular, or inguinal nodes palpable. Lungs: Breath sounds clear and equal without increased work of breathing Cardiovascular: Regular rate and rhythm without murmur. No JVD or edema. Peripheral pulses intact. Abdomen: Nondistended. Soft and nontender. No masses palpable. No organomegaly. No palpable hernias. Extremities: No edema or joint swelling or deformity. No chronic venous stasis changes. Neurologic: Alert and fully oriented. Gait normal.   Laboratory data:  CBC:  No results found for this basename: WBC, RBC, HGB,  HCT, PLT  ]  CMG Labs:  No results found for this basename: GLUF, NA, K, CL, CO2, BUN, CREATININE, CALCIUM, PROT, ALB, BILITOT, BILIDIR, ALKPHOS, AST, ALT     Assessment  53 y.o. female with a new diagnosis of cancer of the the right breast lower  outer quadrant. Clinical IIA, estrogen receptor positive, progesterone receptor positive and Her2/Neu protein/oncogene negative. I discussed with the patient and family members present today initial surgical treatment options. We discussed options of breast conservation with lumpectomy or total mastectomy and sentinal lymph node biopsy/dissection. Options for reconstruction were discussed..She has consulted with Dr. Harlow Mares for plastic surgery. We plan to proceed with bilateral nipple sparing mastectomy with immediate reconstruction and sentinel lymph node biopsy. Plan Bilateral nipple sparing total mastectomy with immediate reconstruction and sentinel lymph node biopsy  Edward Jolly MD, FACS

## 2014-04-17 NOTE — Anesthesia Procedure Notes (Signed)
Procedure Name: Intubation Date/Time: 04/17/2014 7:44 AM Performed by: Barrington Ellison Pre-anesthesia Checklist: Patient identified, Emergency Drugs available, Suction available, Patient being monitored and Timeout performed Patient Re-evaluated:Patient Re-evaluated prior to inductionOxygen Delivery Method: Circle system utilized Preoxygenation: Pre-oxygenation with 100% oxygen Intubation Type: IV induction Ventilation: Mask ventilation without difficulty Laryngoscope Size: Mac and 3 Grade View: Grade I Tube type: Oral Tube size: 7.0 mm Number of attempts: 1 Airway Equipment and Method: Stylet Placement Confirmation: ETT inserted through vocal cords under direct vision,  positive ETCO2 and breath sounds checked- equal and bilateral Secured at: 21 cm Tube secured with: Tape Dental Injury: Teeth and Oropharynx as per pre-operative assessment

## 2014-04-17 NOTE — Anesthesia Postprocedure Evaluation (Signed)
  Anesthesia Post-op Note  Patient: Olivia Acosta  Procedure(s) Performed: Procedure(s): BILATERAL BREAST RECONSTRUCTION WITH PLACEMENT OF TISSUE EXPANDER AND FLEX HD (ACELLULAR HYDRATED DERMIS) (Bilateral) BILATERAL NIPPLE SPARING MASTECTOMY WITH SENTINEL LYMPH NODE BIOPSY (Bilateral)  Patient Location: PACU  Anesthesia Type:General  Level of Consciousness: awake and alert   Airway and Oxygen Therapy: Patient Spontanous Breathing  Post-op Pain: mild  Post-op Assessment: Post-op Vital signs reviewed, Patient's Cardiovascular Status Stable, Respiratory Function Stable, Patent Airway, No signs of Nausea or vomiting and Pain level controlled  Post-op Vital Signs: Reviewed and stable  Last Vitals:  Filed Vitals:   04/17/14 1624  BP: 102/66  Pulse: 75  Temp: 36.6 C  Resp: 16    Complications: No apparent anesthesia complications

## 2014-04-17 NOTE — Anesthesia Postprocedure Evaluation (Signed)
  Anesthesia Post-op Note  Patient: Hydrographic surveyor  Procedure(s) Performed: Procedure(s): BILATERAL BREAST RECONSTRUCTION WITH PLACEMENT OF TISSUE EXPANDER AND FLEX HD (ACELLULAR HYDRATED DERMIS) (Bilateral) BILATERAL NIPPLE SPARING MASTECTOMY WITH SENTINEL LYMPH NODE BIOPSY (Bilateral)  Patient Location: PACU  Anesthesia Type:General  Level of Consciousness: awake, alert  and oriented  Airway and Oxygen Therapy: Patient Spontanous Breathing and Patient connected to nasal cannula oxygen  Post-op Pain: mild  Post-op Assessment: Post-op Vital signs reviewed, Patient's Cardiovascular Status Stable, Respiratory Function Stable, Patent Airway and Pain level controlled  Post-op Vital Signs: stable  Last Vitals:  Filed Vitals:   04/17/14 1554  BP:   Pulse: 66  Temp:   Resp: 12    Complications: No apparent anesthesia complications

## 2014-04-17 NOTE — Plan of Care (Signed)
Problem: Phase I Progression Outcomes Goal: Drain functioning/secure Outcome: Completed/Met Date Met:  04/17/14 Goal: Arm precautions in place Outcome: Completed/Met Date Met:  04/17/14 Goal: Dressings dry/intact Outcome: Completed/Met Date Met:  04/17/14 Goal: Pain controlled with appropriate interventions Outcome: Adequate for Discharge

## 2014-04-17 NOTE — Op Note (Signed)
Preoperative Diagnosis: BREAST CANCER Right Breast  Postoprative Diagnosis: Samr  Procedure: Procedure(s):  BILATERAL NIPPLE SPARING MASTECTOMY WITH Right SENTINEL LYMPH NODE BIOPSY, right axillary dissection   Surgeon: Excell Seltzer T   Assistants: Rolm Bookbinder  Anesthesia:  General endotracheal anesthesia  Indications: Patient is a 53 year old female with a recent diagnosis of invasive lobular carcinoma lower outer right breast. Her tumor measures approximately 2.5 cm with relatively small breasts. MRI showed some additional nodularity extending anteriorly encompassing almost 5 cm. She has very dense breast tissue difficult to screen. After extensive consultation and discussion detailed elsewhere we have elected to proceed with bilateral nipple sparing total mastectomy and right axillary sentinel lymph node biopsy to be followed by immediate reconstruction    Procedure Detail:  Preoperatively in the holding area 1 mCi of technetium sulfur colloid was injected intradermally around the right nipple. Patient was then brought to the operating room, placed in the supine position on the operating table with arms extended and general endotracheal anesthesia induced. She received preoperative IV antibiotics. Foley catheter was placed. The PAS ring placed. The entire anterior chest neck and upper arms and upper abdomen were widely sterilely prepped and draped. Patient timeout was performed and correct procedure verified. I initially approached the left prophylactic side first. An inframammary incision was placed in the inframammary crease about 9 cm long beginning just medial to the nipple and extending around laterally. Initially skin and subcutaneous flaps were developed superiorly up toward the nipple and laterally. The dissection was then deepened using the plasma blade down to the chest wall. The breast tissue was then dissected posteriorly off of the pectoralis extending up superiorly  toward the clavicle and medially to the edge of the sternum and laterally out to the anterior border of the latissimus dorsi which was identified. Using long lighted retractors the flap was then extended superiorly and medially and laterally working up toward the sternum and clavicle. As the nipple was approached the ducts at the nipple were divided sharply near the nipple and a thin areolar flap developed. Dissection then continued superiorly above the superior border of the breast and over to the sternum. Breast tissue isn't attached medially and superiorly retracting the specimen down through the incision. The base of the nipple was exposed and the ducts at the base of the nipple were sharply excised and sent for frozen section. Attachments along the edge of the pectoralis were divided and further of the latissimus until the specimen was attached only at the low axilla and I came across the low axilla with clamps and cautery and the specimen was removed. The specimen was oriented at the nipple and axillary tail. The frozen section of the nipple tissue returned as negative. Hemostasis was assured and the wound was packed with moist saline gauze. The right side was then approached in an identical fashion. The tumor was palpable in the lower outer quadrant and actually quite close to the skin and a very thin skin flap was taken over the tumor. The patient had very little subcutaneous tissue. Following this the right breast was dissected in an identical fashion and removed and oriented in an identical fashion. Nipple biopsy was taken from the base of the right nipple as well and sent for frozen section. The axilla was then exposed and using the neoprobe and a hot area in the axilla was identified and using blunt and plasma blade dissection a slightly enlarged lymph node with counts of about 190 was removed and sent as  sentinel lymph node #1. There was still one area of elevated counts more superiorly in the axilla  and further blunt dissection identified a small lymph node which was elevated and excised and ex vivo had counts of about 60 and was sent as axillary sentinel lymph node #2. Before this some non-hot axillary tissue was removed and sent as axillary tissue. At this point background in the axilla was less than 10. The immediate diagnosis on the 2 sentinel lymph nodes on the right side returned as positive for metastatic carcinoma. I therefore proceeded with right axillary dissection. The lateral edge of the pectoralis minor was freed incising the clavipectoral fascia and using careful blunt dissection medial to the retracted edge of the pectoralis minor the axillary vein was identified. Beginning here all fibrofatty tissue inferior to the vein was swept laterally and inferiorly. Perforating branches of the axillary artery and vein were controlled with clips. The intercostal nerve was divided with clips. As the dissection progressed laterally I encountered the thoracodorsal nerve and vessels which were carefully protected. Medially identified the long thoracic nerve. All fibrofatty tissue between these structures down to the subscapularis was swept inferiorly. Small perforating vessels anterior to the long thoracic vessels were divided between clips and the specimen was taken completely inferiorly off the latissimus and off of the serratus and removed. The axilla was irrigated and complete hemostasis assured.    Findings: As above  Estimated Blood Loss:  200 mL         Drains: per Dr. Harlow Mares  Blood Given: none          Specimens: #1 left total mastectomy     #2 left nipple tissue    #3 right total mastectomy   #4 right nipple tissue    #5 right axillary sentinel lymph node    #6 right axillary tissue    #7 right axillary sentinel lymph node #2        Complications:  * No complications entered in OR log *         Disposition: PACU - hemodynamically stable.         Condition: stable

## 2014-04-18 ENCOUNTER — Encounter (HOSPITAL_COMMUNITY): Payer: Self-pay | Admitting: Plastic Surgery

## 2014-04-18 MED ORDER — CIPROFLOXACIN HCL 500 MG PO TABS
500.0000 mg | ORAL_TABLET | Freq: Two times a day (BID) | ORAL | Status: DC
  Administered 2014-04-18 – 2014-04-19 (×2): 500 mg via ORAL
  Filled 2014-04-18 (×5): qty 1

## 2014-04-18 NOTE — Op Note (Signed)
Olivia Acosta, Olivia Acosta NO.:  [id number]  MEDICAL RECORD NO.:  [id number]  LOCATION:  [id number]                        FACILITY:  [facility]  PHYSICIAN:  Crissie Reese, M.D.     DATE OF BIRTH:  11-25-1960  DATE OF PROCEDURE:  04/17/2014 DATE OF DISCHARGE:                              OPERATIVE REPORT   PREOPERATIVE DIAGNOSIS:  Right breast cancer, lower outer quadrant.  POSTOPERATIVE DIAGNOSIS:  Right breast cancer, lower outer quadrant with metastasis to axillary lymph nodes.  PROCEDURE PERFORMED: 1. Bilateral immediate breast reconstruction with tissue expander. 2. Distinct procedure is bilateral chest wall reconstruction with     acellular dermal matrix for inadequate muscle and reset of     inframammary fold.  SURGEON:  Crissie Reese, M.D.  ANESTHESIA:  General.  ESTIMATED BLOOD LOSS:  25 mL.  DRAINS:  A total of four 19-French drains, 2 right sided and 2 left sided.  CLINICAL NOTE:  A 53 year old woman is having bilateral mastectomy and desired reconstruction.  On her preoperative studies, there was no evidence of metastasis.  She was interested in nipple-sparing mastectomy and her general surgeon planned the procedure, and she understood tissue expansion and breast reconstruction as a planned staged procedure for eventual placement of her implant.  The nature of the procedure and risks and possible complications were discussed with her in detail. These risks include, but not limited to, bleeding, infection, healing problems, scarring, loss of sensation, loss of sensation in the nipple complexes, fluid accumulations, anesthesia related complications, pneumothorax, DVT, PE, contour deformities, contour deformities at the periphery of the reconstruction, loss of tissue, loss of the nipple complexes, loss of skin, failure of device, capsular contracture, displacement of device, wrinkles and ripples, asymmetry, chronic pain, and overall disappointment.  She also  understood that there would be deleterious effects on the reconstruction if she were to need postoperative radiation.  She also understood the need to use an acellular dermal matrix in order to perform her reconstruction immediately in the setting of a nipple-sparing mastectomy.  Having understood all this, she wished to proceed.  DESCRIPTION OF PROCEDURE:  The patient in the operating room and bilateral mastectomy being completed.  The spaces were inspected and noted to be in good condition.  Nipple complexes appeared to be viable as did all of the overlying skin.  No evidence of any vascular compromise.  Thorough irrigation with saline as well as antibiotic solution, hemostasis with electrocautery.  The pectoralis major muscles were lifted at the lateral border and released and irrigation with saline antibiotic solution.  The acellular dermal matrix soaked in first saline for at least 10 minutes and then saline again and then antibiotic solution for at least 10 minutes.  Care was taken to orient this properly.  On the left side, it was a piece of AlloDerm and this was tested with blood and the blood absorbent side was placed up towards the overlying mastectomy flap and on the right side.  Care was taken to orient with the dermal side overlying mastectomy flaps.  Insetting was performed first along the inframammary crease and then after thoroughly cleaning gloves, the tissue expanders were prepared placing 100 mL of sterile saline using  a closed filling system with all the air removed. The expanders were returned to antibiotic solution and again the spaces were inspected and noted to be in good condition.  Once the expanders were in place, the 3-0 Vicryl suture used at the tabs to prevent rotation of the expanders and then the acellular dermal matrix was then closed to the muscle being certain to avoid folds and wrinkles in the acellular dermal matrix.  A small opening was left at the  superior aspect intentionally in order to allow the egress of fluid from the area around the tissue expander.  Great care was taken to avoid any damage to the underlying expanders during this insetting of the acellular dermal matrix and the expanders kept under direct vision at all times.  Again antibiotic solution placed and left in place.  Excellent hemostasis was confirmed.  Two 19-French drains were positioned on each side, 1 medial and 1 lateral, brought through separate stab wounds and secured with 3-0 Prolene sutures.  The skin incisions superior border was then excised where retraction had been performed and this with bright red bleeding along this site as well consistent with viability of the skin.  The skin closure with 3-0 Monocryl inverted deep dermal sutures and a couple of reinforcing 3-0 Prolene simple sutures and Dermabond.  Dry sterile dressings and the breast vest were positioned, and the drains were dressed with Biopatch and sterile OpSites.  She was transferred to recovery room stable having tolerated the procedure well.     Crissie Reese, M.D.     DB/MEDQ  D:  04/17/2014  T:  04/18/2014  Job:  [id number]

## 2014-04-18 NOTE — Progress Notes (Signed)
Subjective: Sore but good pain control. Ambulating and voiding well.  Objective: Vital signs in last 24 hours: Temp:  [97.5 F (36.4 C)-99.1 F (37.3 C)] 99.1 F (37.3 C) (11/13 0517) Pulse Rate:  [59-88] 83 (11/13 0517) Resp:  [12-19] 16 (11/13 0517) BP: (89-135)/(45-70) 135/70 mmHg (11/13 0517) SpO2:  [98 %-100 %] 98 % (11/13 0517) Weight:  [151 lb (68.493 kg)] 151 lb (68.493 kg) (11/12 1624)  Intake/Output from previous day: 11/12 0701 - 11/13 0700 In: 5251.3 [P.O.:505; I.V.:4546.3; IV Piggyback:200] Out: 2517 [Urine:1775; Drains:442; Blood:300] Intake/Output this shift:    Operative sites: Mastectomy flaps look great. Nipple complexes have excellent color. Tissue expanders appear to be in good position. Drains functioning. Drainage thin. No evidence of bleeding or infection either side.  No results for input(s): WBC, HGB, HCT, NA, K, CL, CO2, BUN, CREATININE, GLU in the last 72 hours.  Invalid input(s): PLATELETS  Studies/Results: Nm Sentinel Node Inj-no Rpt (breast)  04/17/2014   CLINICAL DATA: cancer right breast   Sulfur colloid was injected intradermally by the nuclear medicine  technologist for breast cancer sentinel node localization.     Assessment/Plan: Ambulate.   LOS: 1 day    Caylin Nass M 04/18/2014 7:45 AM

## 2014-04-18 NOTE — Progress Notes (Signed)
Patient ID: Olivia Acosta, female   DOB: 1960-06-22, 53 y.o.   MRN: [id number] 1 Day Post-Op  Subjective: Minimal pain. Has been up walking. No other complaints.  Objective: Vital signs in last 24 hours: Temp:  [97.5 F (36.4 C)-99.1 F (37.3 C)] 99.1 F (37.3 C) (11/13 0517) Pulse Rate:  [59-88] 83 (11/13 0517) Resp:  [12-19] 16 (11/13 0517) BP: (89-135)/(45-70) 135/70 mmHg (11/13 0517) SpO2:  [98 %-100 %] 98 % (11/13 0517) Weight:  [151 lb (68.493 kg)] 151 lb (68.493 kg) (11/12 1624) Last BM Date: 04/16/14  Intake/Output from previous day: 11/12 0701 - 11/13 0700 In: 5251.3 [P.O.:505; I.V.:4546.3; IV Piggyback:200] Out: 2517 [Urine:1775; Drains:442; Blood:300] Intake/Output this shift:    General appearance: alert, cooperative and no distress Incision/Wound: skin flaps appear viable. No bleeding or swelling or drainage  Lab Results:  No results for input(s): WBC, HGB, HCT, PLT in the last 72 hours. BMET No results for input(s): NA, K, CL, CO2, GLUCOSE, BUN, CREATININE, CALCIUM in the last 72 hours.   Studies/Results: Nm Sentinel Node Inj-no Rpt (breast)  04/17/2014   CLINICAL DATA: cancer right breast   Sulfur colloid was injected intradermally by the nuclear medicine  technologist for breast cancer sentinel node localization.     Anti-infectives: Anti-infectives    Start     Dose/Rate Route Frequency Ordered Stop   04/17/14 2000  ciprofloxacin (CIPRO) IVPB 400 mg     400 mg200 mL/hr over 60 Minutes Intravenous Every 12 hours 04/17/14 1641     04/17/14 1700  valACYclovir (VALTREX) tablet 500 mg     500 mg Oral Daily 04/17/14 1641     04/17/14 1215  polymyxin B 500,000 Units, bacitracin 50,000 Units in sodium chloride irrigation 0.9 % 500 mL irrigation  Status:  Discontinued       As needed 04/17/14 1215 04/17/14 1440   04/17/14 0600  ciprofloxacin (CIPRO) IVPB 400 mg     400 mg200 mL/hr over 60 Minutes Intravenous On call to O.R. 04/16/14 1407 04/17/14 0825       Assessment/Plan: s/p Procedure(s): BILATERAL BREAST RECONSTRUCTION WITH PLACEMENT OF TISSUE EXPANDER AND FLEX HD (ACELLULAR HYDRATED DERMIS) BILATERAL NIPPLE SPARING MASTECTOMY WITH SENTINEL LYMPH NODE BIOPSY And right axillary dissection Doing well postoperatively without complication. Discharge anticipated tomorrow. I discussed operative findings with the patient i.e. Positive sentinel lymph nodes and that we did an axillary dissection. I discussed we will make oncology appointments when we get the path back and I will call her next week. All questions answered.    LOS: 1 day    Sharmila Wrobleski T 04/18/2014

## 2014-04-18 NOTE — Plan of Care (Signed)
Problem: Phase I Progression Outcomes Goal: Hemodynamically stable Outcome: Completed/Met Date Met:  04/18/14

## 2014-04-18 NOTE — Plan of Care (Signed)
Problem: Phase I Progression Outcomes Goal: Absence of hematoma Outcome: Completed/Met Date Met:  04/18/14 Goal: Tolerating diet Outcome: Completed/Met Date Met:  04/18/14 Goal: Pain controlled with appropriate interventions Outcome: Completed/Met Date Met:  04/18/14 Goal: OOB as tolerated unless otherwise ordered Outcome: Completed/Met Date Met:  04/18/14 Goal: Initial discharge plan identified Outcome: Completed/Met Date Met:  04/18/14 Goal: Voiding-avoid urinary catheter unless indicated Outcome: Completed/Met Date Met:  04/18/14

## 2014-04-19 MED ORDER — ENOXAPARIN SODIUM 40 MG/0.4ML ~~LOC~~ SOLN: 40.0000 mg | SUBCUTANEOUS | Status: DC

## 2014-04-19 MED ORDER — METHOCARBAMOL 500 MG PO TABS: 500.0000 mg | ORAL_TABLET | Freq: Four times a day (QID) | ORAL | Status: DC

## 2014-04-19 MED ORDER — DSS 100 MG PO CAPS: 100.0000 mg | ORAL_CAPSULE | Freq: Every day | ORAL | Status: DC

## 2014-04-19 MED ORDER — ENOXAPARIN SODIUM 40 MG/0.4ML ~~LOC~~ SOLN
40.0000 mg | SUBCUTANEOUS | Status: DC
  Administered 2014-04-19: 40 mg via SUBCUTANEOUS
  Filled 2014-04-19: qty 0.4

## 2014-04-19 MED ORDER — DOXYCYCLINE HYCLATE 100 MG PO TABS: 100.0000 mg | ORAL_TABLET | Freq: Two times a day (BID) | ORAL | Status: DC

## 2014-04-19 MED ORDER — HYDROMORPHONE HCL 2 MG PO TABS: 2.0000 mg | ORAL_TABLET | ORAL | Status: DC | PRN

## 2014-04-19 MED ORDER — DOXYCYCLINE HYCLATE 100 MG PO TABS
100.0000 mg | ORAL_TABLET | Freq: Two times a day (BID) | ORAL | Status: DC
  Administered 2014-04-19: 100 mg via ORAL
  Filled 2014-04-19: qty 1

## 2014-04-19 NOTE — Plan of Care (Signed)
Problem: Discharge Progression Outcomes Goal: Barriers To Progression Addressed/Resolved Outcome: Not Applicable Date Met:  92/92/44 Goal: Absence of hematoma Outcome: Completed/Met Date Met:  04/19/14 Goal: Drain functioning/secure Outcome: Completed/Met Date Met:  04/19/14 Goal: Discharge plan in place and appropriate Outcome: Completed/Met Date Met:  04/19/14 Goal: Pain controlled with appropriate interventions Outcome: Completed/Met Date Met:  62/86/38 Goal: Complications resolved/controlled Outcome: Not Applicable Date Met:  17/71/16 Goal: Tolerating diet Outcome: Completed/Met Date Met:  04/19/14 Goal: Activity appropriate for discharge plan Outcome: Completed/Met Date Met:  04/19/14 Goal: Home Health arrangements in place if appropriate Outcome: Not Applicable Date Met:  57/90/38

## 2014-04-19 NOTE — Discharge Summary (Signed)
Physician Discharge Summary  Patient ID: Olivia Acosta MRN: [id number] DOB/AGE: Sep 19, 1960 53 y.o.  Admit date: 04/17/2014 Discharge date: 04/19/2014  Admission Diagnoses: Right breast cancer  Discharge Diagnoses: Right breast cancer with metastasis to regional lymph nodes Active Problems:   Right breast cancer with malignant cells in regional lymph nodes no greater than 0.2 mm and no more than 200 cells   Discharged Condition: good  Hospital Course: On the day of admission the patient was taken to surgery and had left total nipple sparing mastectomy, right modified radical nipple sparing mastectomy, and bilateral breast reconstruction with tissue expanders and ADM.Marland Kitchen The patient tolerated the procedures well. Postoperatively, the flap maintained excellent color and capillary refill. The patient was ambulatory and tolerating diet on the first postoperative day. DVT prophylaxis continued. By the second day she was ambulating without assistance, had good pain control, and was ready for discharge.  Treatments: antibiotics: Cipro, anticoagulation: heparin and surgery: bilateral nipple sparing mastectomy, right axillary dissection, bilateral breast reconstruction with tissue expanders and ADM  Discharge Exam: Blood pressure 114/53, pulse 84, temperature 98.4 F (36.9 C), temperature source Oral, resp. rate 15, height 5\' 7"  (1.702 m), weight 151 lb (68.493 kg), SpO2 95 %.  Operative sites: Mastectomy flaps viable as are nipple complexes at this point. Tissue expanders appear to be in good position. Drains functioning. Drainage thin. No evidence of bleeding or infection.  Disposition: Discharge home.     Medication List    STOP taking these medications        loratadine 10 MG dissolvable tablet  Commonly known as:  CLARITIN REDITABS     WOMENS MULTIVITAMIN PLUS PO      TAKE these medications        ALPRAZolam 0.5 MG tablet  Commonly known as:  XANAX  Take 0.5 mg by mouth daily  as needed for anxiety or sleep.     doxycycline 100 MG tablet  Commonly known as:  VIBRA-TABS  Take 1 tablet (100 mg total) by mouth every 12 (twelve) hours.     DSS 100 MG Caps  Take 100 mg by mouth daily.     enoxaparin 40 MG/0.4ML injection  Commonly known as:  LOVENOX  Inject 0.4 mLs (40 mg total) into the skin daily.     HYDROmorphone 2 MG tablet  Commonly known as:  DILAUDID  Take 1-2 tablets (2-4 mg total) by mouth every 4 (four) hours as needed for moderate pain.     methocarbamol 500 MG tablet  Commonly known as:  ROBAXIN  Take 1 tablet (500 mg total) by mouth 4 (four) times daily.     valACYclovir 500 MG tablet  Commonly known as:  VALTREX  Take 500 mg by mouth daily.           Follow-up Information    Follow up with HOXWORTH,BENJAMIN T, MD. Schedule an appointment as soon as possible for a visit in 3 weeks.   Specialty:  General Surgery   Contact information:   [address] [place] [address] [phone]       Signed: Macon Large 04/19/2014, 10:10 AM

## 2014-04-19 NOTE — Discharge Instructions (Addendum)
No lifting for 6 weeks No vigorous activity for 6 weeks (including outdoor walks) No driving for 4 weeks OK to walk up stairs slowly Stay propped up Use incentive spirometer at home every hour while awake No shower while drains are in place Empty drains at least three times a day and record the amounts separately Change drain dressings every third day if instructed to do so by Dr. Harlow Mares  Apply Bacitracin antibiotic ointment to the drain sites  Place gauze dressing over drains  Secure the gauze with tape Take an over-the-counter Probiotic while on antibiotics Take an over-the-counter stool softener (such as Colace) while on pain medication Do not raise arms overhead Lovenox shot this afternoon either here at hospital or at home and then continue once a day at about the same time every day Call if drainage changes to a thick red fluid. Thin red is okay. See Dr. Harlow Mares next week. For questions call [phone] or [phone]

## 2014-04-19 NOTE — Progress Notes (Signed)
Gen. Surgery:  Patient sitting up in chair. Doing well. She states that Dr. Harlow Mares told her she might go home this afternoon. Await his assessment  Bilateral nipple sparing mastectomy skin flaps look good. Nipples viable.  The patient knows to follow-up with Dr. Excell Seltzer in 2 or 3 weeks.  she knows that he will probably call pathology report to her Monday or Tuesday.   Edsel Petrin. Dalbert Batman, M.D., [facility] General and Minimally invasive Surgery Breast and Colorectal Surgery Office:   [phone]

## 2014-04-19 NOTE — Discharge Summary (Addendum)
Copy of AVS to pt who verbalizes understanding, has rx for pain med and JP care return demo. Instructed on Lovenox injection with return demo. Will DC to private car home with all personal belongings, acomp. By family. Dc'd per w/c at 1420.

## 2014-04-22 ENCOUNTER — Telehealth (INDEPENDENT_AMBULATORY_CARE_PROVIDER_SITE_OTHER): Payer: Self-pay | Admitting: General Surgery

## 2014-04-22 NOTE — Telephone Encounter (Signed)
Called pt and discussed path report

## 2014-04-24 ENCOUNTER — Other Ambulatory Visit (INDEPENDENT_AMBULATORY_CARE_PROVIDER_SITE_OTHER): Payer: Self-pay

## 2014-04-24 DIAGNOSIS — C50911 Malignant neoplasm of unspecified site of right female breast: Secondary | ICD-10-CM

## 2014-04-28 ENCOUNTER — Telehealth: Payer: Self-pay | Admitting: *Deleted

## 2014-04-28 NOTE — Telephone Encounter (Signed)
Received referral from [place].  Received appt date and time from MD.  Called pt and confirmed 04/30/14 appt w/ her.  Unable to mail before appt letter - gave verbal.  Unable to mail welcoming packet - gave instructions and directions.  Unable to mail intake form - placed a note for one to be given at time of check in.  Emailed Anderson Malta at [employer] to make her aware and request [facility] office notes.  Emailed Dawn to make her aware so pt could be added to the conference list.

## 2014-04-28 NOTE — Progress Notes (Signed)
Location of Breast Cancer: Right breast cancer, lower outer quadrant  Histology per Pathology Report:   Diagnosis 1. Soft tissue, biopsy, Left nipple - BENIGN FIBROFATTY SOFT TISSUE AND BENIGN BREAST PARENCHYMA. - NO TUMOR SEEN. 2. Breast, simple mastectomy, Left stitch, axillary tail - FIBROCYSTIC CHANGES WITH CALCIFICATIONS AND FOCAL USUAL DUCTAL HYPERPLASIA. - NO ATYPIA OR MALIGNANCY IDENTIFIED. 3. Breast, simple mastectomy, Right - MULTIFOCAL INVASIVE GRADE II LOBULAR CARCINOMA WITH LARGEST TUMOR FOCUS SPANNING 5.5 CM IN GREATEST DIMENSION. - LOBULAR CARCINOMA IN SITU. 1 of 5 Duplicate copy FINAL for Olivia Acosta [id number]) Diagnosis(continued) - ASSOCIATED CALCIFICATIONS. - POSTERIOR MARGIN IS FOCALLY POSITIVE. - OTHER MARGINS ARE NEGATIVE. - SEE ONCOLOGY TEMPLATE. 4. Lymph node, sentinel, biopsy, Right #1 - ONE LYMPH NODE POSITIVE FOR METASTATIC LOBULAR CARCINOMA (1/1). - EXTRACAPSULAR EXTENSION IS IDENTIFIED. 5. Lymph node, sentinel, biopsy, Right #2 - ONE LYMPH NODE POSITIVE FOR METASTATIC LOBULAR CARCINOMA (1/1). - SEE COMMENT. 6. Soft tissue, biopsy, Right nipple - BENIGN BREAST TISSUE. - NO TUMOR SEEN. 7. Fatty tissue, Right axillary - BENIGN FAT. - NO ATYPIA OR TUMOR SEEN. 8. Lymph nodes, regional resection, Right axillary content - FOUR OF TEN LYMPH NODES POSITIVE FOR METASTATIC LOBULAR CARCINOMA (4/10). Microscopic Comment 3. BREAST,  1. Bilateral immediate breast reconstruction with tissue expander. 2. Distinct procedure is bilateral chest wall reconstruction with  acellular dermal matrix for inadequate muscle and reset of  inframammary fold.  Receptor Status: ER(92%), PR (98%), Her2-neu (No Amplification), Ki-67 (10%)  Per an 04/17/14 note by Dr. Excell Seltzer [name] recently Felt a lump in the outer aspect of her right breast on monthly self-exam. She states this felt about as big as a pencil eraser. She had had a negative  screening mammogram in March.. Subsequent imaging included diagnostic mamogram showing A spiculated mass in the area of palpable abnormality at the 7 to 8:00 position of the right breast and ultrasound showing A hypoechoic suspicious mass measuring 2.5 x 1.6 x 1.8 cm at the 7:00 position 5 cm from the nipple  Past/Anticipated interventions by surgeon, if [id number] [facility]oxworth- Right Simple Mastectomy and Bilateral Breast Reconstruction   Past/Anticipated interventions by medical oncology, if any: Chemotherapy to star nn 06/12/13     Pain issues, if any:    SAFETY ISSUES:  Prior radiation? NO  Pacemaker/ICD? NO  Possible current pregnancy?NO  Is the patient on methotrexate? NO  Current Complaints / other details:  ] She menarched at early age of 47 and went to menopause at age 46  G3, P2, She has received birth control pills for approximately 6 years.  She was never exposed to fertility medications or hormone replacement therapy.  She has no family history of Breast/GYN/GI cancer    Portacath placed 05/27/14 right chest.  Very soreness in the chest      Jaydyn Menon, Crista Curb, RN 04/28/2014,2:44 PM

## 2014-04-30 ENCOUNTER — Ambulatory Visit: Payer: BC Managed Care – PPO

## 2014-04-30 ENCOUNTER — Encounter: Payer: Self-pay | Admitting: Hematology and Oncology

## 2014-04-30 ENCOUNTER — Ambulatory Visit (HOSPITAL_BASED_OUTPATIENT_CLINIC_OR_DEPARTMENT_OTHER): Payer: BC Managed Care – PPO | Admitting: Hematology and Oncology

## 2014-04-30 DIAGNOSIS — C773 Secondary and unspecified malignant neoplasm of axilla and upper limb lymph nodes: Secondary | ICD-10-CM

## 2014-04-30 DIAGNOSIS — Z170 Estrogen receptor positive status [ER+]: Secondary | ICD-10-CM

## 2014-04-30 DIAGNOSIS — C50511 Malignant neoplasm of lower-outer quadrant of right female breast: Secondary | ICD-10-CM

## 2014-04-30 NOTE — Progress Notes (Signed)
Checked in new pt with no financial concerns at this time.  Pt has Raquel's card for any questions or concerns she may have in the future.  ° °

## 2014-04-30 NOTE — Assessment & Plan Note (Signed)
Right breast invasive lobular cancer 5.5 cm multifocal disease with 6/12 lymph nodes positive T3, N2, M0 stage IIIa, ER 92%, PR 98%, HER-2 negative ratio 1.13, Ki-67 10% status post bilateral mastectomies on 04/17/2014  Pathology and radiology review: I discussed in detail the results of pathology report including the very concerning aspect of being a very large tumor with multiple positive lymph nodes with extracapsular extension. Her risk of relapse is extremely high and I recommended adjuvant systemic chemotherapy. I also discussed the significance of ER PR HER-2/neu receptors and the implications for treatment.  Recommendation: Adriamycin and Cytoxan dose dense every 2 weeks x4 followed by Taxol x12  Chemotherapy counseling:I have discussed the risks and benefits of chemotherapy including the risks of nausea/ vomiting, risk of infection from low WBC count, fatigue due to chemo or anemia, bruising or bleeding due to low platelets, mouth sores, loss/ change in taste and decreased appetite. Liver and kidney function will be monitored through out chemotherapy as abnormalities in liver and kidney function may be a side effect of treatment. Cardiac dysfunction due to Adriamycin was discussed in detail. Risk of permanent bone marrow dysfunction and leukemia due to chemo were also discussed.  Plan: 1. Echocardiogram of the heart 2. PET/CT scan due to the fact that she has 6 positive lymph nodes and stage III disease: Plan to do it in the week of December 20th 3. We'll request port placement from Dr. Hoxworth or Dr. Wakefield 4. Chemotherapy class 5. Plan to start chemotherapy January 4 after the holidays.  Antiemetics plan: I provided her written instructions on the antiemetics regimen. I will send prescriptions for all these medications to pharmacy to be picked up. 

## 2014-04-30 NOTE — Addendum Note (Signed)
Addended by: Prentiss Bells on: 04/30/2014 03:33 PM   Modules accepted: Medications

## 2014-04-30 NOTE — Progress Notes (Signed)
[facility] CONSULT NOTE  Patient Care Team: Colon Branch, MD as PCP - General  CHIEF COMPLAINTS/PURPOSE OF CONSULTATION:  Newly diagnosed breast cancer  HISTORY OF PRESENTING ILLNESS:  Olivia Acosta 53 y.o. female is here because of recent diagnosis of right breast cancer. Patient had a routine screening mammogram in March of this year which did not show any abnormalities. In May or June of this year she felt a lump in the left breast and had workup that revealed a right breast mass. She underwent breast MRI that showed 3.3 cm mass with anterior extension of subcentimeter nodules up to 5.4 cm. She underwent bilateral mastectomies 04/17/2014 with tissue expander placement. Final pathology revealed right breast multifocal invasive lobular cancer grade 2 x 0.5 cm with other satellite nodules and total of 6/12 lymph nodes had cancer with extracapsular extension. She is recovering fairly well from the surgery. She denies any major pain or discomfort. He is slightly sore in the axilla and underneath the implant.  Patient is extremely active runner and stays very fit by exercising frequently.  I reviewed her records extensively and collaborated the history with the patient.  SUMMARY OF ONCOLOGIC HISTORY:   Breast cancer of lower-outer quadrant of right female breast   01/31/2014 Breast MRI Right breast mass 3.3 x 2.8 x 3.4 cm with several foci of subcentimeter enhancing nodules extending anteriorly to worsen nipple up to 5.4 cm anterior to the mass    04/17/2014 Surgery Bilateral mastectomies, left: Benign, right breast multifocal ILC grade 2 largest 5.5 cm, posterior margin focally positive, 2 SLN's positive with extracapsular extension + 4/10 axillary LN positive, ER 92%, PR 98%, HER-2 -1.1.3, Ki 67 10%    In terms of breast cancer risk profile:  She menarched at early age of 74 and went to menopause at age 57  She had 2 pregnancies  She has received birth control pills for  approximately 6 years.  She was never exposed to fertility medications or hormone replacement therapy.  She has no family history of Breast/GYN/GI cancer maternal grandmother 65 thoracic cancer, paternal grandmother 64 cervical cancer, father 65 prostate cancer  MEDICAL HISTORY:  Past Medical History  Diagnosis Date  . Cancer     breast ca    SURGICAL HISTORY: Past Surgical History  Procedure Laterality Date  . Tonsillectomy and adenoidectomy  1974  . Stricture of the uretha  1975  . Tonsillectomy    . Salivary stone removal  09  . Breast reconstruction Bilateral 04/17/2014  . Complete mastectomy w/ sentinel node biopsy Bilateral 04/17/2014    NIPPLE SPARING RECONSTRUCTION  . Breast reconstruction with placement of tissue expander and flex hd (acellular hydrated dermis) Bilateral 04/17/2014    Procedure: BILATERAL BREAST RECONSTRUCTION WITH PLACEMENT OF TISSUE EXPANDER AND FLEX HD (ACELLULAR HYDRATED DERMIS);  Surgeon: Crissie Reese, MD;  Location: [place];  Service: Plastics;  Laterality: Bilateral;  . Mastectomy w/ sentinel node biopsy Bilateral 04/17/2014    Procedure: BILATERAL NIPPLE SPARING MASTECTOMY WITH SENTINEL LYMPH NODE BIOPSY;  Surgeon: Excell Seltzer, MD;  Location: [place] [facility];  Service: General;  Laterality: Bilateral;    SOCIAL HISTORY: History   Social History  . Marital Status: Widowed    Spouse Name: N/A    Number of Children: N/A  . Years of Education: N/A   Occupational History  . Not on file.   Social History Main Topics  . Smoking status: Never Smoker   . Smokeless tobacco: Never Used  . Alcohol  Use: Yes     Comment: occ wine  . Drug Use: No  . Sexual Activity: Not on file   Other Topics Concern  . Not on file   Social History Narrative    FAMILY HISTORY: No family history on file.  ALLERGIES:  is allergic to penicillins.  MEDICATIONS:  Current Outpatient Prescriptions  Medication Sig Dispense Refill  . ALPRAZolam (XANAX) 0.5 MG tablet  Take 0.5 mg by mouth daily as needed for anxiety or sleep.    Marland Kitchen docusate sodium 100 MG CAPS Take 100 mg by mouth daily. 10 capsule 0  . doxycycline (VIBRA-TABS) 100 MG tablet Take 1 tablet (100 mg total) by mouth every 12 (twelve) hours. 30 tablet 0  . enoxaparin (LOVENOX) 40 MG/0.4ML injection Inject 0.4 mLs (40 mg total) into the skin daily. 12 Syringe 0  . HYDROmorphone (DILAUDID) 2 MG tablet Take 1-2 tablets (2-4 mg total) by mouth every 4 (four) hours as needed for moderate pain. 40 tablet 0  . methocarbamol (ROBAXIN) 500 MG tablet Take 1 tablet (500 mg total) by mouth 4 (four) times daily. 40 tablet 1  . valACYclovir (VALTREX) 500 MG tablet Take 500 mg by mouth daily.     No current facility-administered medications for this visit.    REVIEW OF SYSTEMS:   Constitutional: Denies fevers, chills or abnormal night sweats Eyes: Denies blurriness of vision, double vision or watery eyes Ears, nose, mouth, throat, and face: Denies mucositis or sore throat Respiratory: Denies cough, dyspnea or wheezes Cardiovascular: Denies palpitation, chest discomfort or lower extremity swelling Gastrointestinal:  Denies nausea, heartburn or change in bowel habits Skin: Denies abnormal skin rashes Lymphatics: Denies new lymphadenopathy or easy bruising Neurological:Denies numbness, tingling or new weaknesses Behavioral/Psych: Mood is stable, no new changes  Breast:soreness from the recent surgery All other systems were reviewed with the patient and are negative.  PHYSICAL EXAMINATION: ECOG PERFORMANCE STATUS: 1 - Symptomatic but completely ambulatory  Filed Vitals:   04/30/14 1211  BP: 106/63  Pulse: 58  Temp: 98 F (36.7 C)  Resp: 18   Filed Weights   04/30/14 1211  Weight: 149 lb 8 oz (67.813 kg)    GENERAL:alert, no distress and comfortable SKIN: skin color, texture, turgor are normal, no rashes or significant lesions EYES: normal, conjunctiva are pink and non-injected, sclera  clear OROPHARYNX:no exudate, no erythema and lips, buccal mucosa, and tongue normal  NECK: supple, thyroid normal size, non-tender, without nodularity LYMPH:  no palpable lymphadenopathy in the cervical, axillary or inguinal LUNGS: clear to auscultation and percussion with normal breathing effort HEART: regular rate & rhythm and no murmurs and no lower extremity edema ABDOMEN:abdomen soft, non-tender and normal bowel sounds Musculoskeletal:no cyanosis of digits and no clubbing  PSYCH: alert & oriented x 3 with fluent speech NEURO: no focal motor/sensory deficits  LABORATORY DATA:  I have reviewed the data as listed Lab Results  Component Value Date   WBC 3.4* 04/09/2014   HGB 13.5 04/09/2014   HCT 39.9 04/09/2014   MCV 91.9 04/09/2014   PLT 177 04/09/2014   Lab Results  Component Value Date   NA 140 04/09/2014   K 4.1 04/09/2014   CL 103 04/09/2014   CO2 27 04/09/2014    RADIOGRAPHIC STUDIES: I have personally reviewed the radiological reports and agreed with the findings in the report.  ASSESSMENT AND PLAN:  Breast cancer of lower-outer quadrant of right female breast Right breast invasive lobular cancer 5.5 cm multifocal disease with 6/12 lymph  nodes positive T3, N2, M0 stage IIIa, ER 92%, PR 98%, HER-2 negative ratio 1.13, Ki-67 10% status post bilateral mastectomies on 04/17/2014  Pathology and radiology review: I discussed in detail the results of pathology report including the very concerning aspect of being a very large tumor with multiple positive lymph nodes with extracapsular extension. Her risk of relapse is extremely high and I recommended adjuvant systemic chemotherapy. I also discussed the significance of ER PR HER-2/neu receptors and the implications for treatment.  Recommendation: Adriamycin and Cytoxan dose dense every 2 weeks x4 followed by Taxol x12  Chemotherapy counseling:I have discussed the risks and benefits of chemotherapy including the risks of  nausea/ vomiting, risk of infection from low WBC count, fatigue due to chemo or anemia, bruising or bleeding due to low platelets, mouth sores, loss/ change in taste and decreased appetite. Liver and kidney function will be monitored through out chemotherapy as abnormalities in liver and kidney function may be a side effect of treatment. Cardiac dysfunction due to Adriamycin was discussed in detail. Risk of permanent bone marrow dysfunction and leukemia due to chemo were also discussed.  Plan: 1. Echocardiogram of the heart 2. PET/CT scan due to the fact that she has 6 positive lymph nodes and stage III disease: Plan to do it in the week of December 20th 3. We'll request port placement from Dr. Excell Seltzer or Dr. Donne Hazel 4. Chemotherapy class 5. Plan to start chemotherapy January 4 after the holidays.  Antiemetics plan: I provided her written instructions on the antiemetics regimen. I will send prescriptions for all these medications to pharmacy to be picked up. All questions were answered. The patient knows to call the clinic with any problems, questions or concerns.     Rulon Eisenmenger, MD 04/30/2014 1:15 PM

## 2014-05-02 ENCOUNTER — Telehealth: Payer: Self-pay | Admitting: Hematology and Oncology

## 2014-05-02 ENCOUNTER — Telehealth: Payer: Self-pay | Admitting: *Deleted

## 2014-05-02 NOTE — Telephone Encounter (Signed)
Per staff message and POF I have scheduled appts. Advised scheduler of appts. JMW  

## 2014-05-02 NOTE — Telephone Encounter (Signed)
, °

## 2014-05-05 ENCOUNTER — Inpatient Hospital Stay
Admission: RE | Admit: 2014-05-05 | Payer: BC Managed Care – PPO | Source: Ambulatory Visit | Admitting: Radiation Oncology

## 2014-05-05 ENCOUNTER — Ambulatory Visit
Admission: RE | Admit: 2014-05-05 | Discharge: 2014-05-05 | Disposition: A | Discharge status: Home / Self Care | Payer: BC Managed Care – PPO | Source: Ambulatory Visit | Attending: Radiation Oncology | Admitting: Radiation Oncology

## 2014-05-05 ENCOUNTER — Telehealth: Payer: Self-pay | Admitting: *Deleted

## 2014-05-05 DIAGNOSIS — C50511 Malignant neoplasm of lower-outer quadrant of right female breast: Secondary | ICD-10-CM | POA: Insufficient documentation

## 2014-05-05 HISTORY — DX: Malignant neoplasm of unspecified site of unspecified female breast: C50.919

## 2014-05-05 NOTE — Telephone Encounter (Signed)
called patient cell phone, asked if she was going to make her appt this am,  she stated she cancelled her appt sat morning, and wants to wait till after Pet scan, she isn't driving as yet and has to rely on others for transportation, apologizedstating we haven't gotten the message, and we will have our scheduleer call her at her cell phone per her request to make another appt with Dr.Squire,infomred MD and Danne Harbor, RN 7:58 AM

## 2014-05-07 ENCOUNTER — Telehealth: Payer: Self-pay

## 2014-05-07 NOTE — Telephone Encounter (Signed)
Returned call to pt re: chemo starts.  Pt is concerned about chemo dates - will be out of town 12/28-1/2.  Port placement with Hoxworth is within this time frame - she is worried she will not be able to have port placed by 1/4.  Pt also asking if chemo can be Thursday so she can recuperate over the weekend and go back to work on Monday.  She also is needing to have her STD and FMLA worked out with her employer.    Pt will call back to the office tomorrow after she talks to the surgeon's office.

## 2014-05-09 ENCOUNTER — Ambulatory Visit: Payer: BC Managed Care – PPO | Attending: Plastic Surgery | Admitting: Physical Therapy

## 2014-05-09 DIAGNOSIS — Z9889 Other specified postprocedural states: Secondary | ICD-10-CM

## 2014-05-09 DIAGNOSIS — M25611 Stiffness of right shoulder, not elsewhere classified: Secondary | ICD-10-CM | POA: Insufficient documentation

## 2014-05-09 DIAGNOSIS — M25612 Stiffness of left shoulder, not elsewhere classified: Secondary | ICD-10-CM | POA: Insufficient documentation

## 2014-05-09 DIAGNOSIS — Z9013 Acquired absence of bilateral breasts and nipples: Secondary | ICD-10-CM | POA: Insufficient documentation

## 2014-05-09 NOTE — Therapy (Signed)
[place], [place], [address] Phone: [phone]   Fax:  [phone]  Physical Therapy Evaluation  Patient Details  Name: Olivia Acosta MRN: [id number] Date of Birth: Apr 05, 1961  Encounter Date: 05/09/2014      PT End of Session - 05/09/14 1315    Visit Number 1   Number of Visits 8   Date for PT Re-Evaluation 06/13/14   PT Start Time 0940   PT Stop Time 1030   PT Time Calculation (min) 50 min   Activity Tolerance Patient tolerated treatment well   Behavior During Therapy [facility] for tasks assessed/performed      Past Medical History  Diagnosis Date  . Cancer     breast ca    Past Surgical History  Procedure Laterality Date  . Tonsillectomy and adenoidectomy  1974  . Stricture of the uretha  1975  . Tonsillectomy    . Salivary stone removal  09  . Breast reconstruction Bilateral 04/17/2014  . Complete mastectomy w/ sentinel node biopsy Bilateral 04/17/2014    NIPPLE SPARING RECONSTRUCTION  . Breast reconstruction with placement of tissue expander and flex hd (acellular hydrated dermis) Bilateral 04/17/2014    Procedure: BILATERAL BREAST RECONSTRUCTION WITH PLACEMENT OF TISSUE EXPANDER AND FLEX HD (ACELLULAR HYDRATED DERMIS);  Surgeon: Crissie Reese, MD;  Location: [place];  Service: Plastics;  Laterality: Bilateral;  . Mastectomy w/ sentinel node biopsy Bilateral 04/17/2014    Procedure: BILATERAL NIPPLE SPARING MASTECTOMY WITH SENTINEL LYMPH NODE BIOPSY;  Surgeon: Excell Seltzer, MD;  Location: [place];  Service: General;  Laterality: Bilateral;    There were no vitals taken for this visit.  Visit Diagnosis:  Status post mastectomy, bilateral  S/P breast reconstruction, bilateral  Shoulder stiffness, right  Shoulder stiffness, left      Subjective Assessment - 05/09/14 1002    Symptoms soreness, pain from stitches pulling. She still has stitches in and not sure when they will be out. Just has  drain removed 2 days ago   Pertinent History surgery 3 weeks ago  with draings removed.  Pt is avid exerciser and worked out prior to surgery.    Patient Stated Goals improve shoulder range of motion to be able to be able to put a shirt on   Currently in Pain? No/denies   Pain Location Chest  soreness at incision sight not leakage form incisions or drain.          [facility] PT Assessment - 05/09/14 1014    Assessment   Medical Diagnosis breast cancaer    Onset Date 01/02/14   Precautions   Precautions Other (comment)  NO MASSAGE   Required Braces or Orthoses --  wears pink compression at night and mastectomy bra during    Restrictions   Weight Bearing Restrictions No   Other Position/Activity Restrictions activity restrictions per Dr. Harlow Mares   Balance Screen   Has the patient fallen in the past 6 months No   Has the patient had a decrease in activity level because of a fear of falling?  No   Is the patient reluctant to leave their home because of a fear of falling?  No   Home Environment   Living Enviornment Private residence   Living Arrangements Children   Available Help at Discharge Family   Type of [place] to enter   Entrance Stairs-Number of Steps 4   Entrance Stairs-Rails [place] Two level   Home  Equipment None   Prior Function   Level of Independence Independent with basic ADLs   Vocation Other (comment)  works from home   Leisure likes to exercise   Cognition   Overall Cognitive Status Within Functional Limits for tasks assessed   Posture/Postural Control   Posture/Postural Control Postural limitations   Postural Limitations Rounded Shoulders;Forward head   Posture Comments guarded posture to prevent pain   AROM   Overall AROM  Deficits   Overall AROM Comments small area of edema/extra tissue at anterior axilla (lt>rt)) above expander and bra edge   Right Shoulder Extension 65 Degrees   Right Shoulder Flexion 85 Degrees    Right Shoulder ABduction 93 Degrees   Right Shoulder Internal Rotation 70 Degrees   Right Shoulder External Rotation 90 Degrees   Left Shoulder Extension 55 Degrees   Left Shoulder Flexion 123 Degrees   Left Shoulder ABduction 96 Degrees   Left Shoulder Internal Rotation 70 Degrees   Left Shoulder External Rotation 85 Degrees                  Plan - 05/09/14 1316    Clinical Impression Statement 53 yo female who reports being an avid runner and exerciser comes in 3 weeks afer bilateral mastectomy with immediate bilateral breast expander placement. She plans to begin 5 months of chemotherapy next month   Pt will benefit from skilled therapeutic intervention in order to improve on the following deficits Decreased strength;Pain;Decreased range of motion;Increased muscle spasms;Postural dysfunction;Decreased endurance   Rehab Potential Excellent   PT Frequency 2x / week   PT Duration 4 weeks   PT Treatment/Interventions Therapeutic exercise;Patient/family education   PT Next Visit Plan postural retraining with active scapular movement , AROM in supine, core isometrics   Consulted and Agree with Plan of Care Patient              LYMPHEDEMA/ONCOLOGY QUESTIONNAIRE - 05/09/14 1017    Right Upper Extremity Lymphedema   15 cm Proximal to Olecranon Process 27.5 cm   10 cm Proximal to Olecranon Process 27.2 cm   Olecranon Process 23.9 cm   15 cm Proximal to Ulnar Styloid Process 22.5 cm   10 cm Proximal to Ulnar Styloid Process 19.6 cm   Just Proximal to Ulnar Styloid Process 14.3 cm   Across Hand at [employer] 18 cm   At [place] of 2nd Digit 5.7 cm   Left Upper Extremity Lymphedema   15 cm Proximal to Olecranon Process 26.8 cm   10 cm Proximal to Olecranon Process 25 cm   Olecranon Process 23.4 cm   15 cm Proximal to Ulnar Styloid Process 21.6 cm   10 cm Proximal to Ulnar Styloid Process 18.5 cm   Just Proximal to Ulnar Styloid Process 13.9 cm   Across Hand at [employer] 18 cm   At [place] of 2nd Digit 5.4 cm     Pt is right handed and has been weight training with her upper extremities            Katina Dung - 05/09/14 0001    Open a tight or new jar Severe difficulty   Do heavy household chores (wash walls, wash floors) Severe difficulty   Carry a shopping bag or briefcase Moderate difficulty   Wash your back Mild difficulty   Use a knife to cut food Mild difficulty   Recreational activities in which you take some force or impact through your arm, shoulder, or hand (  golf, hammering, tennis) Severe difficulty   During the past week, to what extent has your arm, shoulder or hand problem interfered with your normal social activities with family, friends, neighbors, or groups? Modererately   During the past week, to what extent has your arm, shoulder or hand problem limited your work or other regular daily activities Modererately   Arm, shoulder, or hand pain. Mild   Tingling (pins and needles) in your arm, shoulder, or hand None   Difficulty Sleeping Mild difficulty   DASH Score 43.18 %              Short Term Clinic Goals - 05/09/14 1327    CC Short Term Goal  #1   Title short term goals= long term goals         Long Term Clinic Goals - 05/09/14 1328    CC Long Term Goal  #1   Title pt will have 140 degrees of active shoulder flexion on right so she can put on a shirt over her head   Time 4   Period Weeks   Status New   CC Long Term Goal  #2   Title pt wil have 140 degrees of active shoulder flexion on the left so she can put on a shirt over her head   CC Long Term Goal  #3   Title pt will be independent in a home program for shoulder range of motion   Time 4   Period Weeks   Status New        Problem List Patient Active Problem List   Diagnosis Date Noted  . Breast cancer of lower-outer quadrant of right female breast 04/17/2014  . Cancer of right breast 01/30/2014  . DEPRESSION 08/03/2007  . SWELLING MASS OR  LUMP IN HEAD AND NECK 08/03/2007    Donato Heinz. Owens Shark, PT   05/09/2014, 1:30 PM

## 2014-05-12 ENCOUNTER — Telehealth: Payer: Self-pay | Admitting: Hematology and Oncology

## 2014-05-12 ENCOUNTER — Telehealth: Payer: Self-pay | Admitting: *Deleted

## 2014-05-12 ENCOUNTER — Other Ambulatory Visit: Payer: Self-pay | Admitting: *Deleted

## 2014-05-12 NOTE — Telephone Encounter (Signed)
Patient called requesting chemo appts on 1/4 be moved to 1/6 or 1/7. pof sent to scheduler.

## 2014-05-12 NOTE — Telephone Encounter (Signed)
, °

## 2014-05-13 ENCOUNTER — Telehealth: Payer: Self-pay | Admitting: *Deleted

## 2014-05-13 NOTE — Telephone Encounter (Signed)
I have moved 1/18 to 1/21 JMW

## 2014-05-13 NOTE — Telephone Encounter (Signed)
Per staff message and POF I have scheduled appts. Advised scheduler of appts. JMW  

## 2014-05-14 ENCOUNTER — Other Ambulatory Visit: Payer: BC Managed Care – PPO

## 2014-05-14 ENCOUNTER — Encounter: Payer: Self-pay | Admitting: *Deleted

## 2014-05-14 ENCOUNTER — Ambulatory Visit (HOSPITAL_COMMUNITY)
Admission: RE | Admit: 2014-05-14 | Discharge: 2014-05-14 | Disposition: A | Discharge status: Home / Self Care | Payer: BC Managed Care – PPO | Source: Ambulatory Visit | Attending: Hematology and Oncology | Admitting: Hematology and Oncology

## 2014-05-14 ENCOUNTER — Ambulatory Visit: Payer: BC Managed Care – PPO | Admitting: Physical Therapy

## 2014-05-14 DIAGNOSIS — Z01810 Encounter for preprocedural cardiovascular examination: Secondary | ICD-10-CM | POA: Insufficient documentation

## 2014-05-14 DIAGNOSIS — M25611 Stiffness of right shoulder, not elsewhere classified: Secondary | ICD-10-CM

## 2014-05-14 DIAGNOSIS — Z9889 Other specified postprocedural states: Secondary | ICD-10-CM

## 2014-05-14 DIAGNOSIS — M25612 Stiffness of left shoulder, not elsewhere classified: Secondary | ICD-10-CM

## 2014-05-14 DIAGNOSIS — Z9013 Acquired absence of bilateral breasts and nipples: Secondary | ICD-10-CM

## 2014-05-14 DIAGNOSIS — C50511 Malignant neoplasm of lower-outer quadrant of right female breast: Secondary | ICD-10-CM

## 2014-05-14 DIAGNOSIS — C50919 Malignant neoplasm of unspecified site of unspecified female breast: Secondary | ICD-10-CM | POA: Diagnosis not present

## 2014-05-14 NOTE — Progress Notes (Signed)
Echocardiogram 2D Echocardiogram has been performed.  Shima Compere 05/14/2014, 10:06 AM

## 2014-05-14 NOTE — Therapy (Signed)
[place], [place], [address] Phone: [phone]   Fax:  [phone]  Physical Therapy Treatment  Patient Details  Name: Olivia Acosta MRN: [id number] Date of Birth: [birth year]  Encounter Date: 05/14/2014      PT End of Session - 05/14/14 1710    Visit Number 2   Number of Visits 8   Date for PT Re-Evaluation 06/13/14   PT Start Time 1518   PT Stop Time 1602   PT Time Calculation (min) 44 min   Activity Tolerance Patient limited by pain      Past Medical History  Diagnosis Date  . Cancer     breast ca    Past Surgical History  Procedure Laterality Date  . Tonsillectomy and adenoidectomy  1974  . Stricture of the uretha  1975  . Tonsillectomy    . Salivary stone removal  09  . Breast reconstruction Bilateral 04/17/2014  . Complete mastectomy w/ sentinel node biopsy Bilateral 04/17/2014    NIPPLE SPARING RECONSTRUCTION  . Breast reconstruction with placement of tissue expander and flex hd (acellular hydrated dermis) Bilateral 04/17/2014    Procedure: BILATERAL BREAST RECONSTRUCTION WITH PLACEMENT OF TISSUE EXPANDER AND FLEX HD (ACELLULAR HYDRATED DERMIS);  Surgeon: Crissie Reese, MD;  Location: [facility];  Service: Plastics;  Laterality: Bilateral;  . Mastectomy w/ sentinel node biopsy Bilateral 04/17/2014    Procedure: BILATERAL NIPPLE SPARING MASTECTOMY WITH SENTINEL LYMPH NODE BIOPSY;  Surgeon: Excell Seltzer, MD;  Location: [facility];  Service: General;  Laterality: Bilateral;    There were no vitals taken for this visit.  Visit Diagnosis:  Shoulder stiffness, right  Shoulder stiffness, left  S/P breast reconstruction, bilateral  Status post mastectomy, bilateral      Subjective Assessment - 05/14/14 1521    Symptoms Did fine after evaluation the other day.   Currently in Pain? Yes   Pain Score 4    Pain Location Breast   Pain Orientation Right;Left   Pain Descriptors / Indicators  Tightness;Sore;Other (Comment)  like a pull on stitches   Aggravating Factors  lying down, reaching out and high   Pain Relieving Factors sleeping propped up and arms on pillows and in different positions            [facility] Adult PT Treatment/Exercise - 05/14/14 0001    Exercises   Exercises Shoulder   Shoulder Exercises: Standing   Other Standing Exercises Standing dowel AAROM for flexion and abduction on both right and left, 5 count hold x 3 each   Shoulder Exercises: Pulleys   Flexion 3 minutes   ABduction 3 minutes   Manual Therapy   Manual Therapy Passive ROM   Passive ROM Left, then right PROM at shoulder with stretch to patient tolerance in ER, abduction, and flexion.          PT Education - 05/14/14 1537    Education provided Yes   Education Details standing dowel shoulder flexion and abduction for both right and left sides; also discussed doing wall walking, shoulder rolls in the warm shower   Person(s) Educated Patient   Methods Explanation;Demonstration;Handout   Comprehension Returned demonstration              Plan - 05/14/14 1710    Clinical Impression Statement Pain limits PROM and AAROM, but patient was able to perform therapeutic exercise and tolerate PROM.   PT Next Visit Plan review HEP, continue P/AA/AROM of both shoulders; posture re-ed/strengthening  Problem List Patient Active Problem List   Diagnosis Date Noted  . Breast cancer of lower-outer quadrant of right female breast 04/17/2014  . Cancer of right breast 01/30/2014  . DEPRESSION 08/03/2007  . SWELLING MASS OR LUMP IN HEAD AND NECK 08/03/2007    SALISBURY,DONNA 05/14/2014, 5:15 PM  [place], PT

## 2014-05-14 NOTE — Patient Instructions (Signed)
Flexors Stick Stretch I   Stand or sit, dowel in palm of arm to be stretched. Other arm, holding dowel at side and behind body, pushes arm being stretched forward and upward until straight over head. Hold __5_ seconds. Repeat _5__ times per session. Do _2-3__ sessions per day.  Copyright  VHI. All rights reserved.  Inferior Capsule Stick Stretch I   Stand or sit, dowel in palm of arm to be stretched. Other arm, holding dowel in front of body, pushes outward and upward until arm being stretched is as high to the side as possible. Hold _5__ seconds. Repeat _5__ times per session. Do __2-3_ sessions per day.  Copyright  VHI. All rights reserved.   

## 2014-05-21 ENCOUNTER — Ambulatory Visit: Payer: BC Managed Care – PPO | Admitting: Physical Therapy

## 2014-05-21 ENCOUNTER — Ambulatory Visit: Payer: BC Managed Care – PPO

## 2014-05-21 ENCOUNTER — Ambulatory Visit: Payer: BC Managed Care – PPO | Admitting: Radiation Oncology

## 2014-05-21 DIAGNOSIS — M25611 Stiffness of right shoulder, not elsewhere classified: Secondary | ICD-10-CM

## 2014-05-21 DIAGNOSIS — Z9013 Acquired absence of bilateral breasts and nipples: Secondary | ICD-10-CM

## 2014-05-21 DIAGNOSIS — M25612 Stiffness of left shoulder, not elsewhere classified: Secondary | ICD-10-CM

## 2014-05-21 DIAGNOSIS — Z9889 Other specified postprocedural states: Secondary | ICD-10-CM

## 2014-05-21 NOTE — Therapy (Signed)
[place], [place], [address] Phone: [phone]   Fax:  [phone]  Physical Therapy Treatment  Patient Details  Name: Olivia Acosta MRN: [id number] Date of Birth: Oct 25, 1960  Encounter Date: 05/21/2014      PT End of Session - 05/21/14 1412    Visit Number 3   Number of Visits 8   Date for PT Re-Evaluation 06/13/14   PT Start Time 0937   PT Stop Time 1020   PT Time Calculation (min) 43 min      Past Medical History  Diagnosis Date  . Cancer     breast ca    Past Surgical History  Procedure Laterality Date  . Tonsillectomy and adenoidectomy  1974  . Stricture of the uretha  1975  . Tonsillectomy    . Salivary stone removal  09  . Breast reconstruction Bilateral 04/17/2014  . Complete mastectomy w/ sentinel node biopsy Bilateral 04/17/2014    NIPPLE SPARING RECONSTRUCTION  . Breast reconstruction with placement of tissue expander and flex hd (acellular hydrated dermis) Bilateral 04/17/2014    Procedure: BILATERAL BREAST RECONSTRUCTION WITH PLACEMENT OF TISSUE EXPANDER AND FLEX HD (ACELLULAR HYDRATED DERMIS);  Surgeon: Crissie Reese, MD;  Location: [facility];  Service: Plastics;  Laterality: Bilateral;  . Mastectomy w/ sentinel node biopsy Bilateral 04/17/2014    Procedure: BILATERAL NIPPLE SPARING MASTECTOMY WITH SENTINEL LYMPH NODE BIOPSY;  Surgeon: Excell Seltzer, MD;  Location: [facility];  Service: General;  Laterality: Bilateral;    There were no vitals taken for this visit.  Visit Diagnosis:  Shoulder stiffness, right  Shoulder stiffness, left  S/P breast reconstruction, bilateral  Status post mastectomy, bilateral      Subjective Assessment - 05/21/14 0940    Symptoms Doing well.  "I've been doing the dowel rod at home and walking my had up the shower a half inch more every day."  Trying to go to the second shelf with my glasses now; nothing heavy.   Pertinent History Nothing new.  Still  with a little fat patch above left breast area, per her report.   Currently in Pain? No/denies          [facility] PT Assessment - 05/21/14 0001    AROM   Right Shoulder Flexion 117 Degrees   Right Shoulder ABduction 93 Degrees   Left Shoulder Flexion 132 Degrees   Left Shoulder ABduction 104 Degrees          [facility] Adult PT Treatment/Exercise - 05/21/14 0001    Shoulder Exercises: Seated   Other Seated Exercises Shoulder rolls and shrugs   Shoulder Exercises: Standing   Other Standing Exercises Reviewed standing dowel and wall walking exercise for shoulder abduction and flexion.   Shoulder Exercises: Pulleys   Flexion 3 minutes   ABduction 3 minutes   Manual Therapy   Manual Therapy Passive ROM   Passive ROM Shoulder PROM to tolerance both right and left                Plan - 05/21/14 1413    Clinical Impression Statement Pt. with nice gains in bilateral shoulder active flexion and left shoulder abduction to date.   Pt will benefit from skilled therapeutic intervention in order to improve on the following deficits Decreased range of motion;Decreased strength;Decreased endurance;Pain   Rehab Potential Excellent   PT Treatment/Interventions Passive range of motion;Therapeutic exercise;Patient/family education   PT Next Visit Plan Continue with P/AA/AROM, strengthening   Consulted and Agree with Plan of  Care Patient                              [facility] Goals - 05/21/14 1415    CC Long Term Goal  #1   Status On-going   CC Long Term Goal  #2   Status On-going   CC Long Term Goal  #3   Status Achieved         Problem List Patient Active Problem List   Diagnosis Date Noted  . Breast cancer of lower-outer quadrant of right female breast 04/17/2014  . Cancer of right breast 01/30/2014  . DEPRESSION 08/03/2007  . SWELLING MASS OR LUMP IN HEAD AND NECK 08/03/2007    Tisheena Maguire 05/21/2014, 2:17 PM  Estellar Cadena, PT

## 2014-05-23 ENCOUNTER — Ambulatory Visit: Payer: BC Managed Care – PPO | Admitting: Physical Therapy

## 2014-05-23 ENCOUNTER — Encounter (HOSPITAL_BASED_OUTPATIENT_CLINIC_OR_DEPARTMENT_OTHER): Payer: Self-pay | Admitting: *Deleted

## 2014-05-23 DIAGNOSIS — Z9013 Acquired absence of bilateral breasts and nipples: Secondary | ICD-10-CM | POA: Diagnosis not present

## 2014-05-23 DIAGNOSIS — Z9889 Other specified postprocedural states: Secondary | ICD-10-CM

## 2014-05-23 DIAGNOSIS — M25612 Stiffness of left shoulder, not elsewhere classified: Secondary | ICD-10-CM

## 2014-05-23 DIAGNOSIS — M25611 Stiffness of right shoulder, not elsewhere classified: Secondary | ICD-10-CM

## 2014-05-23 NOTE — Therapy (Signed)
[place], [place], [address] Phone: [phone]   Fax:  [phone]  Physical Therapy Treatment  Patient Details  Name: Olivia Acosta MRN: [id number] Date of Birth: [birth year]  Encounter Date: 05/23/2014      PT End of Session - 05/23/14 1035    Visit Number 4   Number of Visits 8   Date for PT Re-Evaluation 06/13/14   PT Start Time 0931   PT Stop Time 1017   PT Time Calculation (min) 46 min      Past Medical History  Diagnosis Date  . Cancer     breast ca    Past Surgical History  Procedure Laterality Date  . Tonsillectomy and adenoidectomy  1974  . Stricture of the uretha  1975  . Tonsillectomy    . Salivary stone removal  09  . Breast reconstruction Bilateral 04/17/2014  . Complete mastectomy w/ sentinel node biopsy Bilateral 04/17/2014    NIPPLE SPARING RECONSTRUCTION  . Breast reconstruction with placement of tissue expander and flex hd (acellular hydrated dermis) Bilateral 04/17/2014    Procedure: BILATERAL BREAST RECONSTRUCTION WITH PLACEMENT OF TISSUE EXPANDER AND FLEX HD (ACELLULAR HYDRATED DERMIS);  Surgeon: Crissie Reese, MD;  Location: [facility];  Service: Plastics;  Laterality: Bilateral;  . Mastectomy w/ sentinel node biopsy Bilateral 04/17/2014    Procedure: BILATERAL NIPPLE SPARING MASTECTOMY WITH SENTINEL LYMPH NODE BIOPSY;  Surgeon: Excell Seltzer, MD;  Location: [facility];  Service: General;  Laterality: Bilateral;    There were no vitals taken for this visit.  Visit Diagnosis:  Shoulder stiffness, right  Shoulder stiffness, left  S/P breast reconstruction, bilateral  Status post mastectomy, bilateral      Subjective Assessment - 05/23/14 0938    Symptoms Had external stitches removed.  Still having some tightness, but its moreso internal where the internal stitches are.  The tisse exapanders feel very tight when lying on side and lying flat.  Preparing for a PET  scan monday and surgery tuesda to put the port in   Currently in Pain? No/denies  right upper arm feels "tired"  she thinks its from where she had the lymph node removed                    [facility] Adult PT Treatment/Exercise - 05/23/14 1025    Lumbar Exercises: Supine   Bridge 5 reps   Other Supine Lumbar Exercises pelvic tilts   Other Supine Lumbar Exercises leg lengthening   Shoulder Exercises: Supine   Protraction AROM;10 reps;Both   External Rotation AROM;Both;10 reps   Other Supine Exercises dowel stretching with core stabalization   Shoulder Exercises: Seated   External Rotation Both;10 reps  attention to keep shoulders level   Internal Rotation AROM;Both;10 reps  alternating   Other Seated Exercises Shoulder rolls and shrugs   Other Seated Exercises "W" for scapular retraction and downward rotation.   Shoulder Exercises: Pulleys   Flexion 3 minutes   ABduction 3 minutes   Shoulder Exercises: ROM/Strengthening   Ball on Wall 5 reps    Other ROM/Strengthening Exercises closed chain hand on ball 5 circles in each direction with each arm    Other ROM/Strengthening Exercises folded towel up the wall   Manual Therapy   Manual Therapy Passive ROM   Passive ROM both shoulders into flexion, abduction and diagonal patterns             Plan - 05/23/14 1036    Clinical  Impression Statement Continues gradual improvement.  Posture much improved today with patient able to isolate scapular movement.  She demonstrated good core stabalization and  biomechanical form with exercise and is progressing activities at home.   PT Next Visit Plan Continue with P/AA/AROM, strengthening   Consulted and Agree with Plan of Care Patient        Problem List Patient Active Problem List   Diagnosis Date Noted  . Breast cancer of lower-outer quadrant of right female breast 04/17/2014  . Cancer of right breast 01/30/2014  . DEPRESSION 08/03/2007  . SWELLING MASS OR LUMP IN HEAD AND  NECK 08/03/2007   Donato Heinz. Owens Shark, PT   05/23/2014, 10:39 AM  [place], [place], [address] Phone: [phone]   Fax:  [phone]

## 2014-05-26 ENCOUNTER — Ambulatory Visit (HOSPITAL_COMMUNITY)
Admission: RE | Admit: 2014-05-26 | Discharge: 2014-05-26 | Disposition: A | Discharge status: Home / Self Care | Payer: BC Managed Care – PPO | Source: Ambulatory Visit | Attending: Hematology and Oncology | Admitting: Hematology and Oncology

## 2014-05-26 DIAGNOSIS — C50511 Malignant neoplasm of lower-outer quadrant of right female breast: Secondary | ICD-10-CM | POA: Insufficient documentation

## 2014-05-26 LAB — GLUCOSE, CAPILLARY: GLUCOSE-CAPILLARY: 93 mg/dL (ref 70–99)

## 2014-05-26 MED ORDER — FLUDEOXYGLUCOSE F - 18 (FDG) INJECTION
7.8000 | Freq: Once | INTRAVENOUS | Status: AC | PRN
  Administered 2014-05-26: 7.8 via INTRAVENOUS

## 2014-05-27 ENCOUNTER — Encounter: Payer: Self-pay | Admitting: Radiation Oncology

## 2014-05-27 ENCOUNTER — Ambulatory Visit (HOSPITAL_BASED_OUTPATIENT_CLINIC_OR_DEPARTMENT_OTHER): Admit: 2014-05-27 | Payer: Self-pay | Admitting: General Surgery

## 2014-05-27 ENCOUNTER — Ambulatory Visit (HOSPITAL_BASED_OUTPATIENT_CLINIC_OR_DEPARTMENT_OTHER): Payer: BC Managed Care – PPO | Admitting: Anesthesiology

## 2014-05-27 ENCOUNTER — Ambulatory Visit (HOSPITAL_BASED_OUTPATIENT_CLINIC_OR_DEPARTMENT_OTHER)
Admission: RE | Admit: 2014-05-27 | Discharge: 2014-05-27 | Disposition: A | Discharge status: Home / Self Care | Payer: BC Managed Care – PPO | Source: Ambulatory Visit | Attending: General Surgery | Admitting: General Surgery

## 2014-05-27 ENCOUNTER — Ambulatory Visit (HOSPITAL_COMMUNITY): Payer: BC Managed Care – PPO

## 2014-05-27 ENCOUNTER — Encounter (HOSPITAL_BASED_OUTPATIENT_CLINIC_OR_DEPARTMENT_OTHER): Payer: Self-pay | Admitting: *Deleted

## 2014-05-27 ENCOUNTER — Encounter (HOSPITAL_BASED_OUTPATIENT_CLINIC_OR_DEPARTMENT_OTHER)
Admission: RE | Disposition: A | Discharge status: Home / Self Care | Payer: Self-pay | Source: Ambulatory Visit | Attending: General Surgery

## 2014-05-27 DIAGNOSIS — F329 Major depressive disorder, single episode, unspecified: Secondary | ICD-10-CM | POA: Diagnosis not present

## 2014-05-27 DIAGNOSIS — C50919 Malignant neoplasm of unspecified site of unspecified female breast: Secondary | ICD-10-CM | POA: Insufficient documentation

## 2014-05-27 DIAGNOSIS — Z9889 Other specified postprocedural states: Secondary | ICD-10-CM | POA: Diagnosis not present

## 2014-05-27 DIAGNOSIS — Z95828 Presence of other vascular implants and grafts: Secondary | ICD-10-CM

## 2014-05-27 DIAGNOSIS — Z452 Encounter for adjustment and management of vascular access device: Secondary | ICD-10-CM | POA: Diagnosis not present

## 2014-05-27 HISTORY — PX: PORTACATH PLACEMENT: SHX2246

## 2014-05-27 HISTORY — PX: PORT A CATH REVISION: SHX6033

## 2014-05-27 LAB — POCT HEMOGLOBIN-HEMACUE: Hemoglobin: 11.8 g/dL — ABNORMAL LOW (ref 12.0–15.0)

## 2014-05-27 SURGERY — PORT A CATH REVISION
Anesthesia: LOCAL | Site: Chest | Laterality: Right

## 2014-05-27 SURGERY — INSERTION, TUNNELED CENTRAL VENOUS DEVICE, WITH PORT
Anesthesia: General | Site: Chest | Laterality: Left

## 2014-05-27 MED ORDER — MIDAZOLAM HCL 2 MG/2ML IJ SOLN
INTRAMUSCULAR | Status: AC
  Filled 2014-05-27: qty 2

## 2014-05-27 MED ORDER — CIPROFLOXACIN IN D5W 400 MG/200ML IV SOLN
INTRAVENOUS | Status: DC | PRN
  Administered 2014-05-27: 400 mg via INTRAVENOUS

## 2014-05-27 MED ORDER — LIDOCAINE HCL (PF) 1 % IJ SOLN
INTRAMUSCULAR | Status: AC
Start: 2014-05-27 — End: 2014-05-27
  Filled 2014-05-27: qty 30

## 2014-05-27 MED ORDER — HEPARIN SOD (PORK) LOCK FLUSH 100 UNIT/ML IV SOLN
INTRAVENOUS | Status: DC | PRN
  Administered 2014-05-27: 500 [IU]

## 2014-05-27 MED ORDER — FENTANYL CITRATE 0.05 MG/ML IJ SOLN
INTRAMUSCULAR | Status: AC
  Filled 2014-05-27: qty 4

## 2014-05-27 MED ORDER — BUPIVACAINE-EPINEPHRINE 0.25% -1:200000 IJ SOLN
INTRAMUSCULAR | Status: DC | PRN
  Administered 2014-05-27: 14 mL

## 2014-05-27 MED ORDER — SODIUM BICARBONATE 4 % IV SOLN
INTRAVENOUS | Status: AC
  Filled 2014-05-27: qty 5

## 2014-05-27 MED ORDER — PROPOFOL 10 MG/ML IV BOLUS
INTRAVENOUS | Status: DC | PRN
  Administered 2014-05-27: 200 mg via INTRAVENOUS

## 2014-05-27 MED ORDER — LACTATED RINGERS IV SOLN
INTRAVENOUS | Status: DC
  Administered 2014-05-27 (×2): via INTRAVENOUS

## 2014-05-27 MED ORDER — ONDANSETRON HCL 4 MG/2ML IJ SOLN: 4.0000 mg | Freq: Once | INTRAMUSCULAR | Status: DC | PRN

## 2014-05-27 MED ORDER — HEPARIN (PORCINE) IN NACL 2-0.9 UNIT/ML-% IJ SOLN
INTRAMUSCULAR | Status: AC
  Filled 2014-05-27: qty 500

## 2014-05-27 MED ORDER — LIDOCAINE HCL (CARDIAC) 20 MG/ML IV SOLN
INTRAVENOUS | Status: DC | PRN
  Administered 2014-05-27: 100 mg via INTRAVENOUS

## 2014-05-27 MED ORDER — ONDANSETRON HCL 4 MG/2ML IJ SOLN
INTRAMUSCULAR | Status: DC | PRN
  Administered 2014-05-27: 4 mg via INTRAVENOUS

## 2014-05-27 MED ORDER — LIDOCAINE-EPINEPHRINE (PF) 1 %-1:200000 IJ SOLN
INTRAMUSCULAR | Status: AC
  Filled 2014-05-27: qty 10

## 2014-05-27 MED ORDER — BUPIVACAINE-EPINEPHRINE (PF) 0.25% -1:200000 IJ SOLN
INTRAMUSCULAR | Status: AC
  Filled 2014-05-27: qty 30

## 2014-05-27 MED ORDER — MIDAZOLAM HCL 5 MG/5ML IJ SOLN
INTRAMUSCULAR | Status: DC | PRN
  Administered 2014-05-27: 2 mg via INTRAVENOUS

## 2014-05-27 MED ORDER — HEPARIN (PORCINE) IN NACL 2-0.9 UNIT/ML-% IJ SOLN
INTRAMUSCULAR | Status: DC | PRN
  Administered 2014-05-27: 500 mL via INTRAVENOUS

## 2014-05-27 MED ORDER — HEPARIN SOD (PORK) LOCK FLUSH 100 UNIT/ML IV SOLN
INTRAVENOUS | Status: DC | PRN
  Administered 2014-05-27: 500 [IU] via INTRAVENOUS

## 2014-05-27 MED ORDER — OXYCODONE HCL 5 MG PO TABS
ORAL_TABLET | ORAL | Status: AC
  Filled 2014-05-27: qty 1

## 2014-05-27 MED ORDER — FENTANYL CITRATE 0.05 MG/ML IJ SOLN
INTRAMUSCULAR | Status: DC | PRN
  Administered 2014-05-27: 25 ug via INTRAVENOUS
  Administered 2014-05-27: 100 ug via INTRAVENOUS

## 2014-05-27 MED ORDER — MIDAZOLAM HCL 2 MG/2ML IJ SOLN: 1.0000 mg | INTRAMUSCULAR | Status: DC | PRN

## 2014-05-27 MED ORDER — OXYCODONE HCL 5 MG PO TABS
5.0000 mg | ORAL_TABLET | Freq: Once | ORAL | Status: AC
  Administered 2014-05-27: 5 mg via ORAL

## 2014-05-27 MED ORDER — HYDROMORPHONE HCL 1 MG/ML IJ SOLN
INTRAMUSCULAR | Status: AC
  Filled 2014-05-27: qty 1

## 2014-05-27 MED ORDER — DEXAMETHASONE SODIUM PHOSPHATE 4 MG/ML IJ SOLN
INTRAMUSCULAR | Status: DC | PRN
  Administered 2014-05-27: 10 mg via INTRAVENOUS

## 2014-05-27 MED ORDER — HYDROCODONE-ACETAMINOPHEN 5-325 MG PO TABS: 1.0000 | ORAL_TABLET | ORAL | Status: DC | PRN

## 2014-05-27 MED ORDER — CIPROFLOXACIN IN D5W 400 MG/200ML IV SOLN
INTRAVENOUS | Status: AC
  Filled 2014-05-27: qty 200

## 2014-05-27 MED ORDER — LIDOCAINE-EPINEPHRINE (PF) 1 %-1:200000 IJ SOLN
INTRAMUSCULAR | Status: DC | PRN
  Administered 2014-05-27: 9 mL

## 2014-05-27 MED ORDER — HEPARIN SOD (PORK) LOCK FLUSH 100 UNIT/ML IV SOLN
INTRAVENOUS | Status: AC
  Filled 2014-05-27: qty 5

## 2014-05-27 MED ORDER — HYDROMORPHONE HCL 1 MG/ML IJ SOLN
0.2500 mg | INTRAMUSCULAR | Status: DC | PRN
  Administered 2014-05-27 (×2): 0.5 mg via INTRAVENOUS

## 2014-05-27 MED ORDER — FENTANYL CITRATE 0.05 MG/ML IJ SOLN: 50.0000 ug | INTRAMUSCULAR | Status: DC | PRN

## 2014-05-27 MED ORDER — HEPARIN (PORCINE) IN NACL 2-0.9 UNIT/ML-% IJ SOLN
INTRAMUSCULAR | Status: DC | PRN
  Administered 2014-05-27: 500 mL

## 2014-05-27 SURGICAL SUPPLY — 46 items
BAG DECANTER FOR FLEXI CONT (MISCELLANEOUS) ×2 IMPLANT
BANDAGE ADH SHEER 1  50/CT (GAUZE/BANDAGES/DRESSINGS) ×1 IMPLANT
BLADE SURG 15 STRL LF DISP TIS (BLADE) ×1 IMPLANT
BLADE SURG 15 STRL SS (BLADE) ×2
CHLORAPREP W/TINT 26ML (MISCELLANEOUS) ×2 IMPLANT
COVER BACK TABLE 60X90IN (DRAPES) ×2 IMPLANT
COVER MAYO STAND STRL (DRAPES) ×2 IMPLANT
DECANTER SPIKE VIAL GLASS SM (MISCELLANEOUS) IMPLANT
DRAPE C-ARM 42X72 X-RAY (DRAPES) ×2 IMPLANT
DRAPE LAPAROTOMY T 102X78X121 (DRAPES) ×2 IMPLANT
DRAPE UTILITY XL STRL (DRAPES) ×2 IMPLANT
ELECT REM PT RETURN 9FT ADLT (ELECTROSURGICAL)
ELECTRODE REM PT RTRN 9FT ADLT (ELECTROSURGICAL) ×1 IMPLANT
GLOVE BIO SURGEON STRL SZ7.5 (GLOVE) ×1 IMPLANT
GLOVE BIOGEL PI IND STRL 6.5 (GLOVE) IMPLANT
GLOVE BIOGEL PI IND STRL 8 (GLOVE) ×1 IMPLANT
GLOVE BIOGEL PI INDICATOR 6.5 (GLOVE) ×1
GLOVE BIOGEL PI INDICATOR 8 (GLOVE) ×2
GLOVE ECLIPSE 6.5 STRL STRAW (GLOVE) ×1 IMPLANT
GLOVE SS BIOGEL STRL SZ 7.5 (GLOVE) ×1 IMPLANT
GLOVE SUPERSENSE BIOGEL SZ 7.5 (GLOVE) ×1
GOWN STRL REUS W/ TWL LRG LVL3 (GOWN DISPOSABLE) ×1 IMPLANT
GOWN STRL REUS W/ TWL XL LVL3 (GOWN DISPOSABLE) ×1 IMPLANT
GOWN STRL REUS W/TWL LRG LVL3 (GOWN DISPOSABLE) ×2
GOWN STRL REUS W/TWL XL LVL3 (GOWN DISPOSABLE) ×4
IV CATH PLACEMENT UNIT 16 GA (IV SOLUTION) IMPLANT
IV HEPARIN 1000UNITS/500ML (IV SOLUTION) ×2 IMPLANT
IV KIT MINILOC 20X1 SAFETY (NEEDLE) ×1 IMPLANT
KIT BARDPORT ISP (Port) IMPLANT
KIT POWER CATH 8FR (Catheter) ×1 IMPLANT
LIQUID BAND (GAUZE/BANDAGES/DRESSINGS) ×2 IMPLANT
NDL HYPO 25X1 1.5 SAFETY (NEEDLE) ×1 IMPLANT
NEEDLE HYPO 22GX1.5 SAFETY (NEEDLE) ×2 IMPLANT
NEEDLE HYPO 25X1 1.5 SAFETY (NEEDLE) ×2 IMPLANT
PACK BASIN DAY SURGERY FS (CUSTOM PROCEDURE TRAY) ×2 IMPLANT
PENCIL BUTTON HOLSTER BLD 10FT (ELECTRODE) ×1 IMPLANT
SET SHEATH INTRODUCER 10FR (MISCELLANEOUS) IMPLANT
SHEATH COOK PEEL AWAY SET 9F (SHEATH) IMPLANT
SLEEVE SCD COMPRESS KNEE MED (MISCELLANEOUS) IMPLANT
SUT MON AB 4-0 PC3 18 (SUTURE) ×3 IMPLANT
SUT PROLENE 2 0 CT2 30 (SUTURE) ×2 IMPLANT
SUT SILK 4 0 TIES 17X18 (SUTURE) IMPLANT
SYR 5ML LUER SLIP (SYRINGE) ×2 IMPLANT
SYR CONTROL 10ML LL (SYRINGE) ×2 IMPLANT
TOWEL OR 17X24 6PK STRL BLUE (TOWEL DISPOSABLE) ×3 IMPLANT
TOWEL OR NON WOVEN STRL DISP B (DISPOSABLE) ×1 IMPLANT

## 2014-05-27 SURGICAL SUPPLY — 47 items
BAG DECANTER FOR FLEXI CONT (MISCELLANEOUS) ×2 IMPLANT
BANDAGE ADH SHEER 1  50/CT (GAUZE/BANDAGES/DRESSINGS) ×2 IMPLANT
BLADE SURG 15 STRL LF DISP TIS (BLADE) ×1 IMPLANT
BLADE SURG 15 STRL SS (BLADE) ×2
CHLORAPREP W/TINT 26ML (MISCELLANEOUS) ×2 IMPLANT
COVER BACK TABLE 60X90IN (DRAPES) ×2 IMPLANT
COVER MAYO STAND STRL (DRAPES) ×2 IMPLANT
DECANTER SPIKE VIAL GLASS SM (MISCELLANEOUS) IMPLANT
DRAPE C-ARM 42X72 X-RAY (DRAPES) ×2 IMPLANT
DRAPE LAPAROTOMY T 102X78X121 (DRAPES) ×2 IMPLANT
DRAPE UTILITY XL STRL (DRAPES) ×2 IMPLANT
ELECT REM PT RETURN 9FT ADLT (ELECTROSURGICAL) ×2
ELECTRODE REM PT RTRN 9FT ADLT (ELECTROSURGICAL) ×1 IMPLANT
GLOVE BIOGEL PI IND STRL 6.5 (GLOVE) IMPLANT
GLOVE BIOGEL PI IND STRL 8 (GLOVE) ×1 IMPLANT
GLOVE BIOGEL PI INDICATOR 6.5 (GLOVE) ×1
GLOVE BIOGEL PI INDICATOR 8 (GLOVE) ×1
GLOVE ECLIPSE 6.5 STRL STRAW (GLOVE) ×1 IMPLANT
GLOVE EXAM NITRILE EXT CUFF MD (GLOVE) ×1 IMPLANT
GLOVE SS BIOGEL STRL SZ 7.5 (GLOVE) ×1 IMPLANT
GLOVE SUPERSENSE BIOGEL SZ 7.5 (GLOVE) ×1
GOWN STRL REUS W/ TWL LRG LVL3 (GOWN DISPOSABLE) ×1 IMPLANT
GOWN STRL REUS W/ TWL XL LVL3 (GOWN DISPOSABLE) ×1 IMPLANT
GOWN STRL REUS W/TWL LRG LVL3 (GOWN DISPOSABLE) ×2
GOWN STRL REUS W/TWL XL LVL3 (GOWN DISPOSABLE) ×2
IV CATH 14GX2 1/4 (CATHETERS) ×1 IMPLANT
IV CATH PLACEMENT UNIT 16 GA (IV SOLUTION) ×1 IMPLANT
IV KIT MINILOC 20X1 SAFETY (NEEDLE) IMPLANT
KIT BARDPORT ISP (Port) IMPLANT
KIT PORT POWER 8FR ISP CVUE (Catheter) ×2 IMPLANT
KIT PROBE COVER (MISCELLANEOUS) ×1 IMPLANT
LIQUID BAND (GAUZE/BANDAGES/DRESSINGS) ×2 IMPLANT
NDL HYPO 25X1 1.5 SAFETY (NEEDLE) ×1 IMPLANT
NEEDLE HYPO 22GX1.5 SAFETY (NEEDLE) ×2 IMPLANT
NEEDLE HYPO 25X1 1.5 SAFETY (NEEDLE) ×2 IMPLANT
PACK BASIN DAY SURGERY FS (CUSTOM PROCEDURE TRAY) ×2 IMPLANT
PENCIL BUTTON HOLSTER BLD 10FT (ELECTRODE) ×2 IMPLANT
SET SHEATH INTRODUCER 10FR (MISCELLANEOUS) IMPLANT
SHEATH COOK PEEL AWAY SET 9F (SHEATH) IMPLANT
SLEEVE SCD COMPRESS KNEE MED (MISCELLANEOUS) ×1 IMPLANT
SUT MON AB 4-0 PC3 18 (SUTURE) ×3 IMPLANT
SUT PROLENE 2 0 CT2 30 (SUTURE) ×2 IMPLANT
SUT SILK 4 0 TIES 17X18 (SUTURE) IMPLANT
SYR 5ML LUER SLIP (SYRINGE) ×2 IMPLANT
SYR CONTROL 10ML LL (SYRINGE) ×2 IMPLANT
TOWEL OR 17X24 6PK STRL BLUE (TOWEL DISPOSABLE) ×3 IMPLANT
TOWEL OR NON WOVEN STRL DISP B (DISPOSABLE) ×2 IMPLANT

## 2014-05-27 NOTE — Anesthesia Postprocedure Evaluation (Signed)
  Anesthesia Post-op Note  Patient: Olivia Acosta  Procedure(s) Performed: Procedure(s): INSERTION PORT-A-CATH (Left)  Patient Location: PACU  Anesthesia Type: General   Level of Consciousness: awake, alert  and oriented  Airway and Oxygen Therapy: Patient Spontanous Breathing  Post-op Pain: mild  Post-op Assessment: Post-op Vital signs reviewed  Post-op Vital Signs: Reviewed  Last Vitals:  Filed Vitals:   05/27/14 1515  BP:   Pulse: 79  Temp:   Resp: 16    Complications: No apparent anesthesia complications

## 2014-05-27 NOTE — Anesthesia Procedure Notes (Signed)
Procedure Name: LMA Insertion Performed by: Nichole Neyer W Pre-anesthesia Checklist: Patient identified, Timeout performed, Emergency Drugs available, Suction available and Patient being monitored Patient Re-evaluated:Patient Re-evaluated prior to inductionOxygen Delivery Method: Circle system utilized Preoxygenation: Pre-oxygenation with 100% oxygen Intubation Type: IV induction Ventilation: Mask ventilation without difficulty LMA: LMA inserted LMA Size: 4.0 Number of attempts: 1 Placement Confirmation: positive ETCO2 and breath sounds checked- equal and bilateral Tube secured with: Tape Dental Injury: Teeth and Oropharynx as per pre-operative assessment      

## 2014-05-27 NOTE — OR Nursing (Signed)
Patient discharged to PACU via stretcher @1504 

## 2014-05-27 NOTE — Op Note (Signed)
Preoperative Diagnosis: cancer breast port a cath revision   Postoprative Diagnosis: cancer breast port a cath revision `  Procedure: Procedure(s): PORT A CATH REVISION   Surgeon: Excell Seltzer T   Assistants: None  Anesthesia:  Local anesthesia  Indications: Patient earlier today underwent placement of Port-A-Cath via the right internal jugular vein. Postoperative portable chest x-ray in the recovery room showed the distal tip of the catheter to be somewhat distal in the right atrium. I recommended repositioning of the catheter under local anesthesia to place the tip of the catheter a little more proximally. This was discussed with the patient including indications and she is in agreement.    Procedure Detail:  Patient was brought to the operating room and placed in the supine position on the operating table. The right chest was widely sterilely prepped and draped. Patient timeout was performed and correct procedure verified. I used local anesthesia to infiltrate the port incision and the previous suture material was removed and the port exposed. The Prolene sutures were removed and the port brought out of the pocket. I then retracted the catheter a couple centimeters under fluoroscopic guidance until the tip appeared positioned in the distal SVC. The catheter was then trimmed, removed from the port and reattached. The port was replaced in the pocket and sutured to the chest wall with interrupted 2-0 Prolene. Fluoroscopy was again performed indicating the tip of the catheter in the SVC. The wound was closed with interrupted subcutaneous 4-0 Monocryl and subcuticular 4-0 Monocryl and Dermabond. The catheter was then flushed with concentrated heparin solution. Dermabond dressing was applied. Patient tolerated this well.           Complications:  * No complications entered in OR log *         Disposition: PACU - hemodynamically stable.         Condition: stable

## 2014-05-27 NOTE — Discharge Instructions (Signed)
° ° °PORT-A-CATH: POST OP INSTRUCTIONS ° °Always review your discharge instruction sheet given to you by the facility where your surgery was performed.  ° °1. A prescription for pain medication may be given to you upon discharge. Take your pain medication as prescribed, if needed. If narcotic pain medicine is not needed, then you make take acetaminophen (Tylenol) or ibuprofen (Advil) as needed.  °2. Take your usually prescribed medications unless otherwise directed. °3. If you need a refill on your pain medication, please contact our office. All narcotic pain medicine now requires a paper prescription.  Phoned in and fax refills are no longer allowed by law.  Prescriptions will not be filled after 5 pm or on weekends.  °4. You should follow a light diet for the remainder of the day after your procedure. °5. Most patients will experience some mild swelling and/or bruising in the area of the incision. It may take several days to resolve. °6. It is common to experience some constipation if taking pain medication after surgery. Increasing fluid intake and taking a stool softener (such as Colace) will usually help or prevent this problem from occurring. A mild laxative (Milk of Magnesia or Miralax) should be taken according to package directions if there are no bowel movements after 48 hours.  °7. Unless discharge instructions indicate otherwise, you may remove your bandages 48 hours after surgery, and you may shower at that time. You may have steri-strips (small white skin tapes) in place directly over the incision.  These strips should be left on the skin for 7-10 days.  If your surgeon used Dermabond (skin glue) on the incision, you may shower in 24 hours.  The glue will flake off over the next 2-3 weeks.  °8. If your port is left accessed at the end of surgery (needle left in port), the dressing cannot get wet and should only by changed by a healthcare professional. When the port is no longer accessed (when the  needle has been removed), follow step 7.   °9. ACTIVITIES:  Limit activity involving your arms for the next 72 hours. Do no strenuous exercise or activity for 1 week. You may drive when you are no longer taking prescription pain medication, you can comfortably wear a seatbelt, and you can maneuver your car. °10.You may need to see your doctor in the office for a follow-up appointment.  Please °      check with your doctor.  °11.When you receive a new Port-a-Cath, you will get a product guide and  °      ID card.  Please keep them in case you need them. ° °WHEN TO CALL YOUR DOCTOR ([phone]): °1. Fever over 101.0 °2. Chills °3. Continued bleeding from incision °4. Increased redness and tenderness at the site °5. Shortness of breath, difficulty breathing ° ° °The clinic staff is available to answer your questions during regular business hours. Please don’t hesitate to call and ask to speak to one of the nurses or medical assistants for clinical concerns. If you have a medical emergency, go to the nearest emergency room or call 911.  A surgeon from [facility] is always on call at the hospital.  ° ° ° °For further information, please visit [email or web address] ° ° °Post Anesthesia Home Care Instructions ° °Activity: °Get plenty of rest for the remainder of the day. A responsible adult should stay with you for 24 hours following the procedure.  °For the next 24 hours, DO NOT: °-Drive a car °-  Operate machinery °-Drink alcoholic beverages °-Take any medication unless instructed by your physician °-Make any legal decisions or sign important papers. ° °Meals: °Start with liquid foods such as gelatin or soup. Progress to regular foods as tolerated. Avoid greasy, spicy, heavy foods. If nausea and/or vomiting occur, drink only clear liquids until the nausea and/or vomiting subsides. Call your physician if vomiting continues. ° °Special Instructions/Symptoms: °Your throat may feel dry or sore from the  anesthesia or the breathing tube placed in your throat during surgery. If this causes discomfort, gargle with warm salt water. The discomfort should disappear within 24 hours. ° ° ° ° ° ° °

## 2014-05-27 NOTE — Transfer of Care (Signed)
Immediate Anesthesia Transfer of Care Note  Patient: Olivia Acosta  Procedure(s) Performed: Procedure(s): INSERTION PORT-A-CATH (Left)  Patient Location: PACU  Anesthesia Type:General  Level of Consciousness: awake and sedated  Airway & Oxygen Therapy: Patient Spontanous Breathing and Patient connected to face mask oxygen  Post-op Assessment: Report given to PACU RN and Post -op Vital signs reviewed and stable  Post vital signs: Reviewed and stable  Complications: No apparent anesthesia complications

## 2014-05-27 NOTE — H&P (Signed)
Chief complaint: For Port-A-Cath placement  History: Patient is a 53 year old female status post recent bilateral nipple sparing mastectomy for cancer of the right breast. She will require IV access for chemotherapy and presents for Port-A-Cath placement.  Past Medical History  Diagnosis Date  . Cancer     breast ca   Past Surgical History  Procedure Laterality Date  . Tonsillectomy and adenoidectomy  1974  . Stricture of the uretha  1975  . Tonsillectomy    . Salivary stone removal  09  . Breast reconstruction Bilateral 04/17/2014  . Complete mastectomy w/ sentinel node biopsy Bilateral 04/17/2014    NIPPLE SPARING RECONSTRUCTION  . Breast reconstruction with placement of tissue expander and flex hd (acellular hydrated dermis) Bilateral 04/17/2014    Procedure: BILATERAL BREAST RECONSTRUCTION WITH PLACEMENT OF TISSUE EXPANDER AND FLEX HD (ACELLULAR HYDRATED DERMIS);  Surgeon: Crissie Reese, MD;  Location: [facility];  Service: Plastics;  Laterality: Bilateral;  . Mastectomy w/ sentinel node biopsy Bilateral 04/17/2014    Procedure: BILATERAL NIPPLE SPARING MASTECTOMY WITH SENTINEL LYMPH NODE BIOPSY;  Surgeon: Excell Seltzer, MD;  Location: [place];  Service: General;  Laterality: Bilateral;   Current Facility-Administered Medications  Medication Dose Route Frequency Provider Last Rate Last Dose  . fentaNYL (SUBLIMAZE) injection 50-100 mcg  50-100 mcg Intravenous PRN Arabella Merles, MD      . lactated ringers infusion   Intravenous Continuous Arabella Merles, MD 10 mL/hr at 05/27/14 0710    . midazolam (VERSED) injection 1-2 mg  1-2 mg Intravenous PRN Arabella Merles, MD       Allergies  Allergen Reactions  . Penicillins Hives   Exam: BP 107/62 mmHg  Pulse 63  Temp(Src) 97.7 F (36.5 C) (Oral)  Resp 18  Ht 5\' 7"  (1.702 m)  Wt 148 lb 8 oz (67.359 kg)  BMI 23.25 kg/m2  SpO2 100% Gen.: Appears well Skin: No rash or infection Lymph nodes: None palpable Lungs:  Clear equal breath sounds without wheezing or increased work of breathing Cardiac: Regular rate and rhythm Breasts: Healing bilateral incisions with tissue expanders in place  Assessment and plan: Cancer of the breast, requires access for chemotherapy. Plan to proceed with Port-A-Cath placement. I discussed the procedure with the patient including indications in nature the procedure and recovery as well as risks of bleeding, infection, anesthetic complications, pneumothorax and long-term risks of infection or occlusion or DVT. All her questions were answered and she agrees to proceed.   Edward Jolly MD, FACS  05/27/2014, 7:41 AM

## 2014-05-27 NOTE — Op Note (Signed)
Preoperative Diagnosis: cancer breast  Postoprative Diagnosis: cancer breast  Procedure: Procedure(s): INSERTION PORT-A-CATH   Surgeon: Excell Seltzer T   Assistants: None  Anesthesia:  General LMA anesthesia  Indications: Patient is a 53 year old female with recent diagnosis of breast cancer and poor venous access who will require long-term venous access for chemotherapy. Port-A-Cath placement has been recommended and accepted. The procedure and risks have been discussed in detail documented elsewhere. She is brought to the operating room for this procedure.    Procedure Detail:  Patient was brought to the operating room, placed supine position on the operating table and laryngeal mask general anesthesia induced. She received preoperative IV antibiotics. The entire chest and anterior neck were widely sterilely prepped and draped. Patient timeout was performed and correct procedure verified. Initially the left subclavian vein was accessed with some difficulty and guidewire placed. The guidewire tract toward the diaphragm but into the ventricle and I couldn't get the guidewire to thread down past the diaphragm and with the difficulty of insertion was a little concerned about arterial placement although there was no definite arterial blood returned about I elected instead to use the left IJ. However the left internal jugular vein was unable to be cannulated after several attempts. I then did a small cut down on the left deltopectoral groove but I could not find an adequate cephalic vein in this location. I therefore elected to place the catheter on the right side. Ultrasound was used to localize a normal-appearing right internal jugular vein and under direct ultrasound guidance the needle was advanced into the internal jugular vein and the guidewire was able to be advanced down below the diaphragm. The introducer was placed via the guidewire and then the flush catheter placed. The introducer which  was stripped away and the tip of the catheter positioned in the superior vena cava. A site on the right anterior chest wall was chosen and a small transverse incision made subcutaneous pocket created. The catheter was tunneled subcutaneously to the pocket, trimmed to length and attached to the port which was sutured to the anterior chest wall with 2 interrupted Prolene sutures. Fluoroscopy again confirmed good position of the catheter. Incisions were closed with subcutaneous interrupted Monocryl and subcuticular Monocryl and Dermabond. The catheter was accessed and flushed and aspirated easily and was left flushed with concentrated heparin solution. Sponge needle and instrument counts were correct.    Findings: As above  Estimated Blood Loss:  Minimal                Complications:  * No complications entered in OR log *         Disposition: PACU - hemodynamically stable.         Condition: stable

## 2014-05-27 NOTE — Anesthesia Preprocedure Evaluation (Signed)
Anesthesia Evaluation  Patient identified by MRN, date of birth, ID band Patient awake    Reviewed: Allergy & Precautions, H&P , NPO status , Patient's Chart, lab work & pertinent test results  Airway        Dental   Pulmonary          Cardiovascular     Neuro/Psych Depression    GI/Hepatic   Endo/Other    Renal/GU      Musculoskeletal   Abdominal   Peds  Hematology   Anesthesia Other Findings   Reproductive/Obstetrics                             Anesthesia Physical Anesthesia Plan  ASA: I  Anesthesia Plan: General   Post-op Pain Management:    Induction: Intravenous  Airway Management Planned: LMA and Oral ETT  Additional Equipment:   Intra-op Plan:   Post-operative Plan: Extubation in OR  Informed Consent: I have reviewed the patients History and Physical, chart, labs and discussed the procedure including the risks, benefits and alternatives for the proposed anesthesia with the patient or authorized representative who has indicated his/her understanding and acceptance.     Plan Discussed with: CRNA, Anesthesiologist and Surgeon  Anesthesia Plan Comments:         Anesthesia Quick Evaluation

## 2014-05-28 ENCOUNTER — Encounter: Payer: Self-pay | Admitting: Radiation Oncology

## 2014-05-28 ENCOUNTER — Encounter: Payer: Self-pay | Admitting: *Deleted

## 2014-05-28 ENCOUNTER — Ambulatory Visit
Admission: RE | Admit: 2014-05-28 | Discharge: 2014-05-28 | Disposition: A | Discharge status: Home / Self Care | Payer: BC Managed Care – PPO | Source: Ambulatory Visit | Attending: Radiation Oncology | Admitting: Radiation Oncology

## 2014-05-28 ENCOUNTER — Ambulatory Visit: Payer: BC Managed Care – PPO | Admitting: Radiation Oncology

## 2014-05-28 VITALS — BP 109/60 | HR 60 | Temp 97.9°F | Ht 67.0 in | Wt 154.8 lb

## 2014-05-28 DIAGNOSIS — C50511 Malignant neoplasm of lower-outer quadrant of right female breast: Secondary | ICD-10-CM

## 2014-05-28 NOTE — Addendum Note (Signed)
Encounter addended by: Deirdre Evener, RN on: 05/28/2014  5:35 PM<BR>     Documentation filed: Charges VN

## 2014-05-28 NOTE — Progress Notes (Signed)
Radiation Oncology         [phone]) [phone] ________________________________  Initial outpatient Consultation  Name: Olivia Acosta MRN: [id number]  Date: 05/28/2014  DOB: Jul 28, 1960  PY:[id number] Larose Kells, MD  Excell Seltzer, MD   REFERRING PHYSICIAN: Excell Seltzer, MD  DIAGNOSIS: Stage IIIA  pT3, pN2a, cM0 Right Breast LOQ Invasive Lobular Carcinoma, ER+ / PR+ / Her2neg, Grade II.     ICD-9-CM ICD-10-CM   1. Breast cancer of lower-outer quadrant of right female breast 174.5 C50.511     HISTORY OF PRESENT ILLNESS::Olivia Acosta is a 53 y.o. female who presented with a lump in the outer aspect of her right breast on monthly self-exam. She states this felt about as big as a Chiropractor. She had had a negative screening mammogram in March.  Subsequent imaging included diagnostic mamogram showing a spiculated mass in the area of palpable abnormality at the 7 to 8:00 position of the right breast and ultrasound showing a hypoechoic suspicious mass measuring 2.5 x 1.6 x 1.8 cm at the 7:00 position. Biopsy was positive for Invasive Mammary Carcinoma with lobular features, ER+ / PR+ / Her2neg, Grade II.  Dr. Excell Seltzer performed bilateral nipple sparing Simple Mastectomies and Dr. Harlow Mares performed Bilateral Breast Reconstruction. Right breast showed multifocal grade II lobular carcinoma, greatest tumor dimension 5.5cm. 6/12 right axillary nodes were positive. Invasive lobular carcinoma focally involves the posterior margin where the lesion is located (posterior central to lateral portion of the breast). There was ECE from lymph nodes.  Left breast negative.  PET on 05-26-14 was negative for metastases.  Chemotherapy to start on 06/12/13.  She is a runner and looking forward to resuming exercise. She works from home. Undergoing PT for ROM in shoulder post operatively.   PATHOLOGY: 04-17-14 Results: HER-2/NEU BY [facility] - NO AMPLIFICATION OF HER-2 DETECTED.  1. Soft tissue, biopsy, Left nipple -  BENIGN FIBROFATTY SOFT TISSUE AND BENIGN BREAST PARENCHYMA. - NO TUMOR SEEN. 2. Breast, simple mastectomy, Left stitch, axillary tail - FIBROCYSTIC CHANGES WITH CALCIFICATIONS AND FOCAL USUAL DUCTAL HYPERPLASIA. - NO ATYPIA OR MALIGNANCY IDENTIFIED. 3. Breast, simple mastectomy, Right - MULTIFOCAL INVASIVE GRADE II LOBULAR CARCINOMA WITH LARGEST TUMOR FOCUS SPANNING 5.5 CM IN GREATEST DIMENSION. - LOBULAR CARCINOMA IN SITU.   Diagnosis(continued)  - ASSOCIATED CALCIFICATIONS. - POSTERIOR MARGIN IS FOCALLY POSITIVE. - OTHER MARGINS ARE NEGATIVE. - SEE ONCOLOGY TEMPLATE. 4. Lymph node, sentinel, biopsy, Right #1 - ONE LYMPH NODE POSITIVE FOR METASTATIC LOBULAR CARCINOMA (1/1). - EXTRACAPSULAR EXTENSION IS IDENTIFIED. 5. Lymph node, sentinel, biopsy, Right #2 - ONE LYMPH NODE POSITIVE FOR METASTATIC LOBULAR CARCINOMA (1/1). - SEE COMMENT. 6. Soft tissue, biopsy, Right nipple - BENIGN BREAST TISSUE. - NO TUMOR SEEN. 7. Fatty tissue, Right axillary - BENIGN FAT. - NO ATYPIA OR TUMOR SEEN. 8. Lymph nodes, regional resection, Right axillary content - FOUR OF TEN LYMPH NODES POSITIVE FOR METASTATIC LOBULAR CARCINOMA (4/10).  Microscopic Comment 3. BREAST, INVASIVE TUMOR, WITH LYMPH NODES PRESENT Specimen, including laterality and lymph node sampling (sentinel, non-sentinel): Right breast with right sentinel lymph node sampling and axillary tissue. Procedure: Nipple sparing right mastectomy with right sentinel lymph node biopsy and right axillary dissection. Histologic type: Invasive lobular carcinoma. Grade: II. Tubule formation: 3. Nuclear pleomorphism: 2. Mitotic:1. Tumor size (gross and glass slide assessment): The largest tumor focus spans at least 5.5 cm in greatest dimension. However, there are multiple smaller tumor foci scattered throughout the breast on quadrant sampling. Margins: Invasive, distance to closest margin: Invasive lobular carcinoma focally involves  the  posterior margin where the lesion is located (posterior central to lateral portion of the breast). Lymphovascular invasion: Although definitive lymph/vascular invasion is not identified, the tumor is multifocal and multiple lymph nodes are involved, see below. Ductal carcinoma in situ: Not identified. Lobular neoplasia: Yes, lobular carcinoma in situ present. Tumor focality: Multifocal. Treatment effect: Not applicable. Extent of tumor: Skin: Not received. Nipple: Not involved. Skeletal muscle: Not received. Lymph nodes: Examined: 2 Sentinel. 10 Non-sentinel. 12 Total.  Microscopic Comment(continued) Lymph nodes with metastasis: 6. Isolated tumor cells (< 0.2 mm): 0. Micrometastasis: (> 0.2 mm and < 2.0 mm): 0. Macrometastasis: (> 2.0 mm): 6. Extracapsular extension: Present. Breast prognostic profile: Performed on previous case, [id number]. Estrogen receptor: 92%, positive. Progesterone receptor: 98%, positive. Her 2 neu by [facility]: 1.13 ratio, no amplification. Ki-67: 10%. Additional breast findings: Fibrocystic changes with usual ductal hyperplasia and calcifications. Stromal calcification present. TNM: pT3, pN2a.   PREVIOUS RADIATION THERAPY: No  PAST MEDICAL HISTORY:  has a past medical history of Breast cancer.    PAST SURGICAL HISTORY: Past Surgical History  Procedure Laterality Date  . Tonsillectomy and adenoidectomy  1974  . Stricture of the uretha  1975  . Tonsillectomy    . Salivary stone removal  09  . Breast reconstruction Bilateral 04/17/2014  . Complete mastectomy w/ sentinel node biopsy Bilateral 04/17/2014    NIPPLE SPARING RECONSTRUCTION  . Breast reconstruction with placement of tissue expander and flex hd (acellular hydrated dermis) Bilateral 04/17/2014    Procedure: BILATERAL BREAST RECONSTRUCTION WITH PLACEMENT OF TISSUE EXPANDER AND FLEX HD (ACELLULAR HYDRATED DERMIS);  Surgeon: Crissie Reese, MD;  Location: [facility];  Service: Plastics;   Laterality: Bilateral;  . Mastectomy w/ sentinel node biopsy Bilateral 04/17/2014    Procedure: BILATERAL NIPPLE SPARING MASTECTOMY WITH SENTINEL LYMPH NODE BIOPSY;  Surgeon: Excell Seltzer, MD;  Location: [facility];  Service: General;  Laterality: Bilateral;    FAMILY HISTORY: family history includes Cancer in her father, maternal grandmother, and paternal grandmother.  SOCIAL HISTORY:  reports that she has never smoked. She has never used smokeless tobacco. She reports that she drinks alcohol. She reports that she does not use illicit drugs.  ALLERGIES: Penicillins  MEDICATIONS:  Current Outpatient Prescriptions  Medication Sig Dispense Refill  . ALPRAZolam (XANAX) 0.5 MG tablet Take 0.5 mg by mouth daily as needed for anxiety or sleep.    Marland Kitchen docusate sodium 100 MG CAPS Take 100 mg by mouth daily. 10 capsule 0  . HYDROcodone-acetaminophen (NORCO/VICODIN) 5-325 MG per tablet Take 1-2 tablets by mouth every 4 (four) hours as needed for moderate pain or severe pain. 30 tablet 0  . methocarbamol (ROBAXIN) 500 MG tablet Take 1 tablet (500 mg total) by mouth 4 (four) times daily. 40 tablet 1  . promethazine (PHENERGAN) 25 MG tablet   0  . valACYclovir (VALTREX) 500 MG tablet Take 500 mg by mouth daily.     No current facility-administered medications for this encounter.    REVIEW OF SYSTEMS:  Notable for that above.   PHYSICAL EXAM:  height is $RemoveB'5\' 7"'WJByanXG$  (1.702 m) and weight is 154 lb 12.8 oz (70.217 kg). Her temperature is 97.9 F (36.6 C). Her blood pressure is 109/60 and her pulse is 60. Her oxygen saturation is 100%.   General: Alert and oriented, in no acute distress HEENT: Head is normocephalic. Extraocular movements are intact. Oropharynx is clear. Neck: Neck is supple, no palpable cervical or supraclavicular lymphadenopathy. Heart: Regular in rate and rhythm with  no murmurs, rubs, or gallops. Chest: Clear to auscultation bilaterally, with no rhonchi, wheezes, or rales. Right  Sided  P-A-C Abdomen: Soft, nontender, nondistended, with no rigidity or guarding. Extremities: No cyanosis or edema. Lymphatics: see Neck Exam Skin: No concerning lesions. Musculoskeletal: symmetric strength and muscle tone throughout. Cannot raise her right arm over head. Neurologic: Cranial nerves II through XII are grossly intact. No obvious focalities. Speech is fluent. Coordination is intact. Psychiatric: Judgment and insight are intact. Affect is appropriate. Breasts: s/p bilateral nipple sparing mastectomy w/ tissue expander placement bilaterally. Drains are out.  partial left nipple necrosis. No deep palpation today.  ECOG = 0  0 - Asymptomatic (Fully active, able to carry on all predisease activities without restriction)  1 - Symptomatic but completely ambulatory (Restricted in physically strenuous activity but ambulatory and able to carry out work of a light or sedentary nature. For example, light housework, office work)  2 - Symptomatic, <50% in bed during the day (Ambulatory and capable of all self care but unable to carry out any work activities. Up and about more than 50% of waking hours)  3 - Symptomatic, >50% in bed, but not bedbound (Capable of only limited self-care, confined to bed or chair 50% or more of waking hours)  4 - Bedbound (Completely disabled. Cannot carry on any self-care. Totally confined to bed or chair)  5 - Death   Santiago Glad MM, Creech RH, Tormey DC, et al. [phone]). "Toxicity and response criteria of the [facility] Group". Am. Evlyn Clines. Oncol. 5 (6): 649-55   LABORATORY DATA:  Lab Results  Component Value Date   WBC 3.4* 04/09/2014   HGB 11.8* 05/27/2014   HCT 39.9 04/09/2014   MCV 91.9 04/09/2014   PLT 177 04/09/2014   CMP     Component Value Date/Time   NA 140 04/09/2014 0846   K 4.1 04/09/2014 0846   CL 103 04/09/2014 0846   CO2 27 04/09/2014 0846   GLUCOSE 86 04/09/2014 0846   BUN 21 04/09/2014 0846   CREATININE 0.77 04/09/2014  0846   CALCIUM 9.5 04/09/2014 0846   GFRNONAA >90 04/09/2014 0846   GFRAA >90 04/09/2014 0846         RADIOGRAPHY: Nm Pet Image Initial (pi) Skull Base To Thigh  05/26/2014   CLINICAL DATA:  Initial treatment strategy for breast cancer.  EXAM: NUCLEAR MEDICINE PET SKULL BASE TO THIGH  TECHNIQUE: 7.8 mCi F-18 FDG was injected intravenously. Full-ring PET imaging was performed from the skull base to thigh after the radiotracer. CT data was obtained and used for attenuation correction and anatomic localization.  FASTING BLOOD GLUCOSE:  Value: 93 mg/dl  COMPARISON:  None.  FINDINGS: NECK  No hypermetabolic lymph nodes in the neck.  CHEST  No hypermetabolic mediastinal or hilar nodes. There is a 4 mm subpleural nodule in the left upper lobe, image 19/series 8. This is too small to characterize by PET-CT. No hypermetabolic nodules noted. Status post bilateral mastectomy. Tissue expanders are noted within the chest wall.  ABDOMEN/PELVIS  No abnormal hypermetabolic activity within the liver, pancreas, adrenal glands, or spleen. No hypermetabolic lymph nodes in the abdomen or pelvis.  SKELETON  No focal hypermetabolic activity to suggest skeletal metastasis.  IMPRESSION: 1. No findings identified to suggest hypermetabolic metastasis. 2. Postoperative changes from bilateral mastectomy with tissue expander placement. 3. Small left upper lobe nodule is nonspecific measuring 4 mm. Attention on followup imaging is recommended.   Electronically Signed   By: Ladona Ridgel  Clovis Riley M.D.   On: 05/26/2014 10:39   Dg Chest Port 1 View  05/27/2014   CLINICAL DATA:  53 year old female status post porta cath revision. Initial encounter.  EXAM: PORTABLE CHEST - 1 VIEW  COMPARISON:  0956 hr the same day and earlier.  FINDINGS: Portable AP semi upright view at 1544 hrs. Right IJ approach porta cath again noted. Catheter tip now terminates at the lower SVC level about 1.5 vertebral bodies below the carina. No pneumothorax. Stable  postoperative changes to the right axilla and chest wall, including bilateral tissue expanders. Larger lung volumes and improved left lung base ventilation. Stable cardiac size and mediastinal contours.  IMPRESSION: 1. Right IJ approach porta cath in place with no adverse features, catheter tip now at the lower SVC level. 2.  No acute cardiopulmonary abnormality.   Electronically Signed   By: Lars Pinks M.D.   On: 05/27/2014 15:25   Dg Chest Port 1 View  05/27/2014   CLINICAL DATA:  Port-A-Cath placement  EXAM: PORTABLE CHEST - 1 VIEW  COMPARISON:  None.  FINDINGS: Right jugular Port-A-Cath has been placed with the tip in the right atrium. No pneumothorax.  Bilateral tissue expanders are noted.  Bibasilar atelectasis.  Negative for edema or effusion.  IMPRESSION: Port-A-Cath tip in the right atrium without pneumothorax  Mild bibasilar atelectasis.   Electronically Signed   By: Franchot Gallo M.D.   On: 05/27/2014 10:03   Dg Fluoro Guide Cv Line-no Report  05/27/2014   CLINICAL DATA:    FLOURO GUIDE CV LINE  Fluoroscopy was utilized by the requesting physician.  No radiographic  interpretation.    Dg Fluoro Guide Cv Line-no Report  05/27/2014   CLINICAL DATA:    FLOURO GUIDE CV LINE  Fluoroscopy was utilized by the requesting physician.  No radiographic  interpretation.       IMPRESSION/PLAN:  Stage IIIA  pT3, pN2a, cM0 Right Breast LOQ Invasive Lobular Carcinoma, ER+ / PR+ / Her2neg, Grade II. Positive margins after mastectomy and reconstruction.  It was a pleasure meeting the patient today. We discussed the risks, benefits, and side effects of radiotherapy to the right breast and regional nodes. We discussed that radiation would take approximately 6-7 weeks to complete and  she Olivia complete chemotherapy before starting treatment planning. We spoke about acute effects including skin irritation and fatigue as well as much less common late effects including lung irritation.  We Olivia discuss all of  this in more detail at her followup prior to treatment planning. There is risk of cosmetic deleterious effects in the setting of immediate reconstruction.  We spoke about the latest technology that is used to minimize the risk of late effects for breast cancer patients undergoing radiotherapy. No guarantees of treatment were given. The patient is enthusiastic about proceeding with treatment. I look forward to participating in the patient's care.  I would appreciate if she is referred back to me at the end of chemotherapy.     __________________________________________   Eppie Gibson, MD

## 2014-06-02 ENCOUNTER — Ambulatory Visit: Payer: BC Managed Care – PPO

## 2014-06-02 DIAGNOSIS — M25612 Stiffness of left shoulder, not elsewhere classified: Secondary | ICD-10-CM

## 2014-06-02 DIAGNOSIS — M25611 Stiffness of right shoulder, not elsewhere classified: Secondary | ICD-10-CM

## 2014-06-02 DIAGNOSIS — Z9013 Acquired absence of bilateral breasts and nipples: Secondary | ICD-10-CM | POA: Diagnosis not present

## 2014-06-02 DIAGNOSIS — Z9889 Other specified postprocedural states: Secondary | ICD-10-CM

## 2014-06-02 NOTE — Therapy (Signed)
[facility], [place], [address] Phone: [phone]   Fax:  [phone]  Physical Therapy Treatment  Patient Details  Name: Olivia Acosta MRN: [id number] Date of Birth: 14-Oct-1960  Encounter Date: 06/02/2014      PT End of Session - 06/02/14 1037    Visit Number 5   Number of Visits 8   Date for PT Re-Evaluation 06/13/14   PT Start Time 1022   PT Stop Time 1101   PT Time Calculation (min) 39 min      Past Medical History  Diagnosis Date  . Breast cancer     Past Surgical History  Procedure Laterality Date  . Tonsillectomy and adenoidectomy  1974  . Stricture of the uretha  1975  . Tonsillectomy    . Salivary stone removal  09  . Breast reconstruction Bilateral 04/17/2014  . Complete mastectomy w/ sentinel node biopsy Bilateral 04/17/2014    NIPPLE SPARING RECONSTRUCTION  . Breast reconstruction with placement of tissue expander and flex hd (acellular hydrated dermis) Bilateral 04/17/2014    Procedure: BILATERAL BREAST RECONSTRUCTION WITH PLACEMENT OF TISSUE EXPANDER AND FLEX HD (ACELLULAR HYDRATED DERMIS);  Surgeon: Crissie Reese, MD;  Location: [facility];  Service: Plastics;  Laterality: Bilateral;  . Mastectomy w/ sentinel node biopsy Bilateral 04/17/2014    Procedure: BILATERAL NIPPLE SPARING MASTECTOMY WITH SENTINEL LYMPH NODE BIOPSY;  Surgeon: Excell Seltzer, MD;  Location: [facility];  Service: General;  Laterality: Bilateral;    LMP 05/28/2011  Visit Diagnosis:  Shoulder stiffness, right  Shoulder stiffness, left  S/P breast reconstruction, bilateral  Status post mastectomy, bilateral      Subjective Assessment - 06/02/14 1025    Symptoms Had my port placement surgery last Tuesday and it was supposed to be 2 hours, but they had trouble placing it on the Lt so it ended up on the Rt and took a total of 6 hours! So Ive been very sore, just started back doing my wall slides in the shower  over the weekend. No pain there today, just feeling very sore from the procedure.                    [facility] Adult PT Treatment/Exercise - 06/02/14 0001    Shoulder Exercises: Pulleys   Flexion 2 minutes   ABduction 2 minutes   Shoulder Exercises: Therapy Ball   Flexion 10 reps  Roll ball up wall   Shoulder Exercises: ROM/Strengthening   "W" Arms 10 reps for scapular retraction  Pt with good technique   Other ROM/Strengthening Exercises closed chain hand on ball 5 circles in each direction with each arm    Manual Therapy   Passive ROM In supine to bil shoulders into flexion, abduction, and D2 all tp pts tolerance.                   Short Term Clinic Goals - 05/09/14 1327    CC Short Term Goal  #1   Title short term goals= long term goals             Long Term Clinic Goals - 05/21/14 1415    CC Long Term Goal  #1   Status On-going   CC Long Term Goal  #2   Status On-going   CC Long Term Goal  #3   Status Achieved            Plan - 06/02/14 1103    Clinical Impression Statement  Pt very sore still from overlong procedure last week of placing port. Both sides of chest are still very sore and tender. Pt did, however, tolerate stretching today and had improvements throughout treatment.   Pt will benefit from skilled therapeutic intervention in order to improve on the following deficits Decreased range of motion;Decreased strength;Decreased endurance;Pain   Rehab Potential Excellent   PT Frequency 2x / week   PT Duration 4 weeks   PT Treatment/Interventions Passive range of motion;Therapeutic exercise;Patient/family education   PT Next Visit Plan Continue with P/AA/AROM, strengthening. Remeasure AROM next visit to assess goals.   Consulted and Agree with Plan of Care Patient        Problem List Patient Active Problem List   Diagnosis Date Noted  . Breast cancer of lower-outer quadrant of right female breast 04/17/2014  . Cancer of right  breast 01/30/2014  . DEPRESSION 08/03/2007  . SWELLING MASS OR LUMP IN HEAD AND NECK 08/03/2007    Golda Acre 06/02/2014, 11:05 AM  [place], [place], [address] Phone: [phone]   Fax:  [phone]

## 2014-06-03 ENCOUNTER — Ambulatory Visit: Payer: BC Managed Care – PPO

## 2014-06-03 ENCOUNTER — Other Ambulatory Visit: Payer: Self-pay | Admitting: Hematology and Oncology

## 2014-06-03 DIAGNOSIS — Z9013 Acquired absence of bilateral breasts and nipples: Secondary | ICD-10-CM

## 2014-06-03 DIAGNOSIS — M25611 Stiffness of right shoulder, not elsewhere classified: Secondary | ICD-10-CM

## 2014-06-03 DIAGNOSIS — Z9889 Other specified postprocedural states: Secondary | ICD-10-CM

## 2014-06-03 DIAGNOSIS — M25612 Stiffness of left shoulder, not elsewhere classified: Secondary | ICD-10-CM

## 2014-06-03 NOTE — Therapy (Addendum)
[place], [place], [address] Phone: [phone]   Fax:  [phone]  Physical Therapy Treatment  Patient Details  Name: Olivia Acosta MRN: [id number] Date of Birth: Feb 25, 1961  Encounter Date: 06/03/2014    Past Medical History  Diagnosis Date  . Bronchitis 2016    treated /w IV antibiotics, admitted for 2 nights [facility]  . GERD (gastroesophageal reflux disease)   . Neuropathy ([place])     both feet since chemotherapy  . Breast cancer ([place])     R breast- 2015  . Arthritis     fingers stiff    Past Surgical History  Procedure Laterality Date  . Tonsillectomy and adenoidectomy  1974  . Stricture of the uretha  1975  . Tonsillectomy    . Breast reconstruction Bilateral 04/17/2014  . Complete mastectomy w/ sentinel node biopsy Bilateral 04/17/2014    NIPPLE SPARING RECONSTRUCTION  . Breast reconstruction with placement of tissue expander and flex hd (acellular hydrated dermis) Bilateral 04/17/2014    Procedure: BILATERAL BREAST RECONSTRUCTION WITH PLACEMENT OF TISSUE EXPANDER AND FLEX HD (ACELLULAR HYDRATED DERMIS);  Surgeon: Crissie Reese, MD;  Location: [place];  Service: Plastics;  Laterality: Bilateral;  . Mastectomy w/ sentinel node biopsy Bilateral 04/17/2014    Procedure: BILATERAL NIPPLE SPARING MASTECTOMY WITH SENTINEL LYMPH NODE BIOPSY;  Surgeon: Excell Seltzer, MD;  Location: [place];  Service: General;  Laterality: Bilateral;  . Portacath placement Left 05/27/2014    Procedure: INSERTION PORT-A-CATH;  Surgeon: Excell Seltzer, MD;  Location: [place];  Service: General;  Laterality: Left;  . Port a cath revision Right 05/27/2014    Procedure: PORT A CATH REVISION;  Surgeon: Excell Seltzer, MD;  Location: [place];  Service: General;  Laterality: Right;  . Salivary stone removal  2009  . Removal of tissue expander and placement of implant Bilateral  06/04/2015    Procedure: REMOVAL OF BILATERAL TISSUE EXPANDER AND DELAYED BREAST RECONSTRUCTION PLACEMENT OF IMPLANTS;  Surgeon: Crissie Reese, MD;  Location: [place];  Service: Plastics;  Laterality: Bilateral;  . Port-a-cath removal Right 06/04/2015    Procedure: REMOVAL PORT-A-CATH;  Surgeon: Crissie Reese, MD;  Location: [place];  Service: Plastics;  Laterality: Right;    LMP 05/28/2011  Visit Diagnosis:  Shoulder stiffness, right  Shoulder stiffness, left  S/P breast reconstruction, bilateral  Status post mastectomy, bilateral                             Short Term Clinic Goals - 05/09/14 1327    CC Short Term Goal  #1   Title short term goals= long term goals             Long Term Clinic Goals - 06/03/14 1001    CC Long Term Goal  #1   Title pt will have 140 degrees of active shoulder flexion on right so she can put on a shirt over her head   Time 4   Period Weeks   Status On-going   CC Long Term Goal  #2   Title pt wil have 140 degrees of active shoulder flexion on the left so she can put on a shirt over her head   Status On-going   CC Long Term Goal  #3   Title pt will be independent in a home program for shoulder range of motion   Status Achieved  Problem List Patient Active Problem List   Diagnosis Date Noted  . Breast cancer, right breast ([place]) 06/04/2015  . Fever 09/14/2014  . Leukopenia 09/14/2014  . Flu-like symptoms 09/14/2014  . Acute bronchitis 09/14/2014  . Breast cancer of lower-outer quadrant of right female breast ([place]) 04/17/2014  . DEPRESSION 08/03/2007  . SWELLING MASS OR LUMP IN HEAD AND NECK 08/03/2007    SALISBURY,DONNA, PTA 06/17/2015, 9:25 AM  [place], [place], [address] Phone: [phone]   Fax:  [phone]     PHYSICAL THERAPY DISCHARGE SUMMARY  Visits from Start of Care: 6  Current functional level  related to goals / functional outcomes: Goals partially met as noted above.   Remaining deficits: Unknown:  Patient did not return after this visit as had been planned.   Education / Equipment: HEP Plan: Patient agrees to discharge.  Patient goals were partially met. Patient is being discharged due to not returning since the last visit.  ?????       Serafina Royals, PT 06/17/2015 9:25 AM

## 2014-06-04 ENCOUNTER — Encounter (HOSPITAL_BASED_OUTPATIENT_CLINIC_OR_DEPARTMENT_OTHER): Payer: Self-pay | Admitting: General Surgery

## 2014-06-05 DIAGNOSIS — C50511 Malignant neoplasm of lower-outer quadrant of right female breast: Secondary | ICD-10-CM

## 2014-06-05 MED ORDER — LIDOCAINE-PRILOCAINE 2.5-2.5 % EX CREA: TOPICAL_CREAM | CUTANEOUS | Status: DC

## 2014-06-05 MED ORDER — ONDANSETRON HCL 8 MG PO TABS: 8.0000 mg | ORAL_TABLET | Freq: Two times a day (BID) | ORAL | Status: DC | PRN

## 2014-06-05 MED ORDER — LORAZEPAM 0.5 MG PO TABS: 0.5000 mg | ORAL_TABLET | Freq: Four times a day (QID) | ORAL | Status: DC | PRN

## 2014-06-05 MED ORDER — DEXAMETHASONE 4 MG PO TABS: 4.0000 mg | ORAL_TABLET | Freq: Two times a day (BID) | ORAL | Status: DC

## 2014-06-05 MED ORDER — PROCHLORPERAZINE MALEATE 10 MG PO TABS: 10.0000 mg | ORAL_TABLET | Freq: Four times a day (QID) | ORAL | Status: DC | PRN

## 2014-06-05 NOTE — Progress Notes (Signed)
Released pre-meds and called in to [facility].  LMOVM for patient that prescription sent - pt to call clinic with questions.

## 2014-06-06 DIAGNOSIS — J40 Bronchitis, not specified as acute or chronic: Secondary | ICD-10-CM

## 2014-06-06 HISTORY — DX: Bronchitis, not specified as acute or chronic: J40

## 2014-06-09 ENCOUNTER — Ambulatory Visit: Payer: BC Managed Care – PPO | Admitting: Hematology and Oncology

## 2014-06-09 ENCOUNTER — Other Ambulatory Visit: Payer: BC Managed Care – PPO

## 2014-06-09 ENCOUNTER — Ambulatory Visit: Payer: BC Managed Care – PPO

## 2014-06-10 ENCOUNTER — Ambulatory Visit: Payer: BC Managed Care – PPO

## 2014-06-12 ENCOUNTER — Telehealth: Payer: Self-pay | Admitting: Hematology and Oncology

## 2014-06-12 ENCOUNTER — Ambulatory Visit (HOSPITAL_BASED_OUTPATIENT_CLINIC_OR_DEPARTMENT_OTHER): Payer: BLUE CROSS/BLUE SHIELD

## 2014-06-12 ENCOUNTER — Other Ambulatory Visit (HOSPITAL_BASED_OUTPATIENT_CLINIC_OR_DEPARTMENT_OTHER): Payer: BLUE CROSS/BLUE SHIELD

## 2014-06-12 ENCOUNTER — Ambulatory Visit (HOSPITAL_BASED_OUTPATIENT_CLINIC_OR_DEPARTMENT_OTHER): Payer: BLUE CROSS/BLUE SHIELD | Admitting: Hematology and Oncology

## 2014-06-12 DIAGNOSIS — C50511 Malignant neoplasm of lower-outer quadrant of right female breast: Secondary | ICD-10-CM

## 2014-06-12 DIAGNOSIS — C773 Secondary and unspecified malignant neoplasm of axilla and upper limb lymph nodes: Secondary | ICD-10-CM

## 2014-06-12 DIAGNOSIS — Z170 Estrogen receptor positive status [ER+]: Secondary | ICD-10-CM

## 2014-06-12 DIAGNOSIS — Z5111 Encounter for antineoplastic chemotherapy: Secondary | ICD-10-CM

## 2014-06-12 LAB — CBC WITH DIFFERENTIAL/PLATELET
BASO%: 1.4 % (ref 0.0–2.0)
BASOS ABS: 0.1 10*3/uL (ref 0.0–0.1)
EOS ABS: 0.2 10*3/uL (ref 0.0–0.5)
EOS%: 5.3 % (ref 0.0–7.0)
HCT: 38.3 % (ref 34.8–46.6)
HEMOGLOBIN: 12.4 g/dL (ref 11.6–15.9)
LYMPH%: 25.2 % (ref 14.0–49.7)
MCH: 29.5 pg (ref 25.1–34.0)
MCHC: 32.3 g/dL (ref 31.5–36.0)
MCV: 91.5 fL (ref 79.5–101.0)
MONO#: 0.4 10*3/uL (ref 0.1–0.9)
MONO%: 10.9 % (ref 0.0–14.0)
NEUT#: 2.1 10*3/uL (ref 1.5–6.5)
NEUT%: 57.2 % (ref 38.4–76.8)
PLATELETS: 208 10*3/uL (ref 145–400)
RBC: 4.19 10*6/uL (ref 3.70–5.45)
RDW: 13.5 % (ref 11.2–14.5)
WBC: 3.7 10*3/uL — ABNORMAL LOW (ref 3.9–10.3)
lymph#: 0.9 10*3/uL (ref 0.9–3.3)

## 2014-06-12 LAB — COMPREHENSIVE METABOLIC PANEL (CC13)
ALK PHOS: 65 U/L (ref 40–150)
ALT: 20 U/L (ref 0–55)
AST: 22 U/L (ref 5–34)
Albumin: 4 g/dL (ref 3.5–5.0)
Anion Gap: 9 mEq/L (ref 3–11)
BUN: 11.2 mg/dL (ref 7.0–26.0)
CHLORIDE: 104 meq/L (ref 98–109)
CO2: 28 mEq/L (ref 22–29)
Calcium: 9.3 mg/dL (ref 8.4–10.4)
Creatinine: 0.8 mg/dL (ref 0.6–1.1)
EGFR: 86 mL/min/{1.73_m2} — AB (ref >90)
Glucose: 78 mg/dl (ref 70–140)
POTASSIUM: 4.2 meq/L (ref 3.5–5.1)
SODIUM: 140 meq/L (ref 136–145)
TOTAL PROTEIN: 7 g/dL (ref 6.4–8.3)
Total Bilirubin: 0.51 mg/dL (ref 0.20–1.20)

## 2014-06-12 MED ORDER — DOXORUBICIN HCL CHEMO IV INJECTION 2 MG/ML
60.0000 mg/m2 | Freq: Once | INTRAVENOUS | Status: AC
  Administered 2014-06-12: 108 mg via INTRAVENOUS
  Filled 2014-06-12: qty 54

## 2014-06-12 MED ORDER — SODIUM CHLORIDE 0.9 % IV SOLN
150.0000 mg | Freq: Once | INTRAVENOUS | Status: AC
  Administered 2014-06-12: 150 mg via INTRAVENOUS
  Filled 2014-06-12: qty 5

## 2014-06-12 MED ORDER — SODIUM CHLORIDE 0.9 % IV SOLN
600.0000 mg/m2 | Freq: Once | INTRAVENOUS | Status: AC
  Administered 2014-06-12: 1080 mg via INTRAVENOUS
  Filled 2014-06-12: qty 54

## 2014-06-12 MED ORDER — SODIUM CHLORIDE 0.9 % IJ SOLN
10.0000 mL | INTRAMUSCULAR | Status: DC | PRN
  Administered 2014-06-12: 10 mL
  Filled 2014-06-12: qty 10

## 2014-06-12 MED ORDER — PALONOSETRON HCL INJECTION 0.25 MG/5ML
INTRAVENOUS | Status: AC
  Filled 2014-06-12: qty 5

## 2014-06-12 MED ORDER — DEXAMETHASONE SODIUM PHOSPHATE 20 MG/5ML IJ SOLN
INTRAMUSCULAR | Status: AC
  Filled 2014-06-12: qty 5

## 2014-06-12 MED ORDER — PALONOSETRON HCL INJECTION 0.25 MG/5ML
0.2500 mg | Freq: Once | INTRAVENOUS | Status: AC
  Administered 2014-06-12: 0.25 mg via INTRAVENOUS

## 2014-06-12 MED ORDER — DEXAMETHASONE SODIUM PHOSPHATE 20 MG/5ML IJ SOLN
12.0000 mg | Freq: Once | INTRAMUSCULAR | Status: AC
  Administered 2014-06-12: 12 mg via INTRAVENOUS

## 2014-06-12 MED ORDER — HEPARIN SOD (PORK) LOCK FLUSH 100 UNIT/ML IV SOLN
500.0000 [IU] | Freq: Once | INTRAVENOUS | Status: AC | PRN
  Administered 2014-06-12: 500 [IU]
  Filled 2014-06-12: qty 5

## 2014-06-12 MED ORDER — SODIUM CHLORIDE 0.9 % IV SOLN
Freq: Once | INTRAVENOUS | Status: AC
  Administered 2014-06-12: 11:00:00 via INTRAVENOUS

## 2014-06-12 NOTE — Progress Notes (Signed)
Patient Care Team: Colon Branch, MD as PCP - General  DIAGNOSIS: Breast cancer of lower-outer quadrant of right female breast   Staging form: Breast, AJCC 7th Edition     Clinical: Stage IIA (T2(m), N0, M0) - Unsigned     Pathologic: Stage IIIA (T3, N2a, cM0) - Signed by Rulon Eisenmenger, MD on 06/12/2014   SUMMARY OF ONCOLOGIC HISTORY:   Breast cancer of lower-outer quadrant of right female breast   01/31/2014 Breast MRI Right breast mass 3.3 x 2.8 x 3.4 cm with several foci of subcentimeter enhancing nodules extending anteriorly to worsen nipple up to 5.4 cm anterior to the mass    04/17/2014 Surgery Bilateral mastectomies, left: Benign, right breast multifocal ILC grade 2 largest 5.5 cm, posterior margin focally positive, 2 SLN's positive with extracapsular extension + 4/10 axillary LN positive, ER 92%, PR 98%, HER-2 -1.1.3, Ki 67 10%   06/12/2014 -  Chemotherapy Adriamycin and Cytoxan dose dense every 2 weeks x4 followed by Taxol x12    CHIEF COMPLIANT: Cycle 1 day 1 of dose dense Adriamycin Cytoxan  INTERVAL HISTORY: Olivia Acosta is a 54 year old lady with above-mentioned history of right-sided breast cancer underwent bilateral mastectomies. She is here today to start adjuvant chemotherapy with dose dense Adriamycin and Cytoxan. Her port has been placed. Echocardiogram was normal. Chemotherapy education as been complete. She is here today to sign consent and start treatment.  REVIEW OF SYSTEMS:   Constitutional: Denies fevers, chills or abnormal weight loss Eyes: Denies blurriness of vision Ears, nose, mouth, throat, and face: Denies mucositis or sore throat Respiratory: Denies cough, dyspnea or wheezes Cardiovascular: Denies palpitation, chest discomfort or lower extremity swelling Gastrointestinal:  Denies nausea, heartburn or change in bowel habits Skin: Denies abnormal skin rashes Lymphatics: Denies new lymphadenopathy or easy bruising Neurological:Denies numbness, tingling or new  weaknesses Behavioral/Psych: Mood is stable, no new changes  All other systems were reviewed with the patient and are negative.  I have reviewed the past medical history, past surgical history, social history and family history with the patient and they are unchanged from previous note.  ALLERGIES:  is allergic to penicillins.  MEDICATIONS:  Current Outpatient Prescriptions  Medication Sig Dispense Refill  . ALPRAZolam (XANAX) 0.5 MG tablet Take 0.5 mg by mouth daily as needed for anxiety or sleep.    Marland Kitchen dexamethasone (DECADRON) 4 MG tablet Take 1 tablet (4 mg total) by mouth 2 (two) times daily. Take 2 tablets by mouth once a day on the day after chemotherapy and then take 2 tablets two times a day for 2 days. Take with food. 30 tablet 1  . docusate sodium 100 MG CAPS Take 100 mg by mouth daily. 10 capsule 0  . HYDROcodone-acetaminophen (NORCO/VICODIN) 5-325 MG per tablet Take 1-2 tablets by mouth every 4 (four) hours as needed for moderate pain or severe pain. (Patient not taking: Reported on 06/02/2014) 30 tablet 0  . lidocaine-prilocaine (EMLA) cream Apply to affected area once 30 g 3  . LORazepam (ATIVAN) 0.5 MG tablet Take 1 tablet (0.5 mg total) by mouth every 6 (six) hours as needed (Nausea or vomiting). 30 tablet 0  . methocarbamol (ROBAXIN) 500 MG tablet Take 1 tablet (500 mg total) by mouth 4 (four) times daily. 40 tablet 1  . ondansetron (ZOFRAN) 8 MG tablet Take 1 tablet (8 mg total) by mouth 2 (two) times daily as needed. Start on the third day after chemotherapy. 30 tablet 1  . prochlorperazine (COMPAZINE) 10 MG  tablet Take 1 tablet (10 mg total) by mouth every 6 (six) hours as needed (Nausea or vomiting). 30 tablet 1  . promethazine (PHENERGAN) 25 MG tablet   0  . valACYclovir (VALTREX) 500 MG tablet Take 500 mg by mouth daily.     No current facility-administered medications for this visit.   Facility-Administered Medications Ordered in Other Visits  Medication Dose Route  Frequency Provider Last Rate Last Dose  . sodium chloride 0.9 % injection 10 mL  10 mL Intracatheter PRN Rulon Eisenmenger, MD   10 mL at 06/12/14 1353    PHYSICAL EXAMINATION: ECOG PERFORMANCE STATUS: 1 - Symptomatic but completely ambulatory  Filed Vitals:   06/12/14 1000  BP: 120/72  Pulse: 65  Temp: 98 F (36.7 C)  Resp: 18   Filed Weights   06/12/14 1000  Weight: 152 lb (68.947 kg)    GENERAL:alert, no distress and comfortable SKIN: skin color, texture, turgor are normal, no rashes or significant lesions EYES: normal, Conjunctiva are pink and non-injected, sclera clear OROPHARYNX:no exudate, no erythema and lips, buccal mucosa, and tongue normal  NECK: supple, thyroid normal size, non-tender, without nodularity LYMPH:  no palpable lymphadenopathy in the cervical, axillary or inguinal LUNGS: clear to auscultation and percussion with normal breathing effort HEART: regular rate & rhythm and no murmurs and no lower extremity edema ABDOMEN:abdomen soft, non-tender and normal bowel sounds Musculoskeletal:no cyanosis of digits and no clubbing  NEURO: alert & oriented x 3 with fluent speech, no focal motor/sensory deficits  LABORATORY DATA:  I have reviewed the data as listed   Chemistry      Component Value Date/Time   NA 140 06/12/2014 0950   NA 140 04/09/2014 0846   K 4.2 06/12/2014 0950   K 4.1 04/09/2014 0846   CL 103 04/09/2014 0846   CO2 28 06/12/2014 0950   CO2 27 04/09/2014 0846   BUN 11.2 06/12/2014 0950   BUN 21 04/09/2014 0846   CREATININE 0.8 06/12/2014 0950   CREATININE 0.77 04/09/2014 0846      Component Value Date/Time   CALCIUM 9.3 06/12/2014 0950   CALCIUM 9.5 04/09/2014 0846   ALKPHOS 65 06/12/2014 0950   AST 22 06/12/2014 0950   ALT 20 06/12/2014 0950   BILITOT 0.51 06/12/2014 0950       Lab Results  Component Value Date   WBC 3.7* 06/12/2014   HGB 12.4 06/12/2014   HCT 38.3 06/12/2014   MCV 91.5 06/12/2014   PLT 208 06/12/2014    NEUTROABS 2.1 06/12/2014   ASSESSMENT & PLAN:  Breast cancer of lower-outer quadrant of right female breast Right breast invasive lobular cancer 5.5 cm multifocal disease with 6/12 lymph nodes positive T3, N2, M0 stage IIIa, ER 92%, PR 98%, HER-2 negative ratio 1.13, Ki-67 10% status post bilateral mastectomies on 04/17/2014  Current treatment:Adriamycin and Cytoxan dose dense every 2 weeks x4 followed by Taxol x12 today cycle 1 day 1 Chemotherapy monitoring: 1. Echocardiogram showed an ejection fraction of 5560% 2. Blood counts were reviewed and the Rhinocort for treatment 3. Consent was signed by the patient 4. Antiemetic regimen was reviewed in great detail and all the questions have been answered 5. Patient went through chemotherapy class  Return to clinic in 1 week for toxicity check and follow-up   Orders Placed This Encounter  Procedures  . CBC with Differential    Standing Status: Future     Number of Occurrences:      Standing Expiration Date: 06/12/2015  .  Comprehensive metabolic panel (Cmet) - [facility]    Standing Status: Future     Number of Occurrences:      Standing Expiration Date: 06/12/2015   The patient has a good understanding of the overall plan. she agrees with it. She will call with any problems that may develop before her next visit here.   Rulon Eisenmenger, MD 06/12/2014 1:55 PM

## 2014-06-12 NOTE — Telephone Encounter (Signed)
, °

## 2014-06-12 NOTE — Assessment & Plan Note (Signed)
Right breast invasive lobular cancer 5.5 cm multifocal disease with 6/12 lymph nodes positive T3, N2, M0 stage IIIa, ER 92%, PR 98%, HER-2 negative ratio 1.13, Ki-67 10% status post bilateral mastectomies on 04/17/2014  Current treatment:Adriamycin and Cytoxan dose dense every 2 weeks x4 followed by Taxol x12 today cycle 1 day 1 Chemotherapy monitoring: 1. Echocardiogram showed an ejection fraction of 5560% 2. Blood counts were reviewed and the Rhinocort for treatment 3. Consent was signed by the patient 4. Antiemetic regimen was reviewed in great detail and all the questions have been answered 5. Patient went through chemotherapy class  Return to clinic in 1 week for toxicity check and follow-up

## 2014-06-12 NOTE — Patient Instructions (Signed)
[facility] Discharge Instructions for Patients Receiving Chemotherapy  Today you received the following chemotherapy agents adriamycin and cytoxan.  To help prevent nausea and vomiting after your treatment, we encourage you to take your nausea medication as directed.   If you develop nausea and vomiting that is not controlled by your nausea medication, call the clinic.   BELOW ARE SYMPTOMS THAT SHOULD BE REPORTED IMMEDIATELY:  *FEVER GREATER THAN 100.5 F  *CHILLS WITH OR WITHOUT FEVER  NAUSEA AND VOMITING THAT IS NOT CONTROLLED WITH YOUR NAUSEA MEDICATION  *UNUSUAL SHORTNESS OF BREATH  *UNUSUAL BRUISING OR BLEEDING  TENDERNESS IN MOUTH AND THROAT WITH OR WITHOUT PRESENCE OF ULCERS  *URINARY PROBLEMS  *BOWEL PROBLEMS  UNUSUAL RASH Items with * indicate a potential emergency and should be followed up as soon as possible.  Feel free to call the clinic you have any questions or concerns. The clinic phone number is [phone].

## 2014-06-13 ENCOUNTER — Telehealth: Payer: Self-pay | Admitting: *Deleted

## 2014-06-13 ENCOUNTER — Ambulatory Visit (HOSPITAL_BASED_OUTPATIENT_CLINIC_OR_DEPARTMENT_OTHER): Payer: BLUE CROSS/BLUE SHIELD

## 2014-06-13 DIAGNOSIS — C50511 Malignant neoplasm of lower-outer quadrant of right female breast: Secondary | ICD-10-CM

## 2014-06-13 DIAGNOSIS — Z5189 Encounter for other specified aftercare: Secondary | ICD-10-CM

## 2014-06-13 MED ORDER — PEGFILGRASTIM INJECTION 6 MG/0.6ML ~~LOC~~
6.0000 mg | PREFILLED_SYRINGE | Freq: Once | SUBCUTANEOUS | Status: AC
  Administered 2014-06-13: 6 mg via SUBCUTANEOUS
  Filled 2014-06-13: qty 0.6

## 2014-06-13 NOTE — Telephone Encounter (Signed)
No adverse effect from chemo treatment. Feeling well, has antiemetics.

## 2014-06-13 NOTE — Patient Instructions (Signed)
Pegfilgrastim injection What is this medicine? PEGFILGRASTIM (peg fil GRA stim) is a long-acting granulocyte colony-stimulating factor that stimulates the growth of neutrophils, a type of white blood cell important in the body's fight against infection. It is used to reduce the incidence of fever and infection in patients with certain types of cancer who are receiving chemotherapy that affects the bone marrow. This medicine may be used for other purposes; ask your health care provider or pharmacist if you have questions. COMMON BRAND NAME(S): Neulasta What should I tell my health care provider before I take this medicine? They need to know if you have any of these conditions: -latex allergy -ongoing radiation therapy -sickle cell disease -skin reactions to acrylic adhesives (On-Body Injector only) -an unusual or allergic reaction to pegfilgrastim, filgrastim, other medicines, foods, dyes, or preservatives -pregnant or trying to get pregnant -breast-feeding How should I use this medicine? This medicine is for injection under the skin. If you get this medicine at home, you will be taught how to prepare and give the pre-filled syringe or how to use the On-body Injector. Refer to the patient Instructions for Use for detailed instructions. Use exactly as directed. Take your medicine at regular intervals. Do not take your medicine more often than directed. It is important that you put your used needles and syringes in a special sharps container. Do not put them in a trash can. If you do not have a sharps container, call your pharmacist or healthcare provider to get one. Talk to your pediatrician regarding the use of this medicine in children. Special care may be needed. Overdosage: If you think you have taken too much of this medicine contact a poison control center or emergency room at once. NOTE: This medicine is only for you. Do not share this medicine with others. What if I miss a dose? It is  important not to miss your dose. Call your doctor or health care professional if you miss your dose. If you miss a dose due to an On-body Injector failure or leakage, a new dose should be administered as soon as possible using a single prefilled syringe for manual use. What may interact with this medicine? Interactions have not been studied. Give your health care provider a list of all the medicines, herbs, non-prescription drugs, or dietary supplements you use. Also tell them if you smoke, drink alcohol, or use illegal drugs. Some items may interact with your medicine. This list may not describe all possible interactions. Give your health care provider a list of all the medicines, herbs, non-prescription drugs, or dietary supplements you use. Also tell them if you smoke, drink alcohol, or use illegal drugs. Some items may interact with your medicine. What should I watch for while using this medicine? You may need blood work done while you are taking this medicine. If you are going to need a MRI, CT scan, or other procedure, tell your doctor that you are using this medicine (On-Body Injector only). What side effects may I notice from receiving this medicine? Side effects that you should report to your doctor or health care professional as soon as possible: -allergic reactions like skin rash, itching or hives, swelling of the face, lips, or tongue -dizziness -fever -pain, redness, or irritation at site where injected -pinpoint red spots on the skin -shortness of breath or breathing problems -stomach or side pain, or pain at the shoulder -swelling -tiredness -trouble passing urine Side effects that usually do not require medical attention (report to your doctor   or health care professional if they continue or are bothersome): -bone pain -muscle pain This list may not describe all possible side effects. Call your doctor for medical advice about side effects. You may report side effects to FDA at  1-800-FDA-1088. Where should I keep my medicine? Keep out of the reach of children. Store pre-filled syringes in a refrigerator between 2 and 8 degrees C (36 and 46 degrees F). Do not freeze. Keep in carton to protect from light. Throw away this medicine if it is left out of the refrigerator for more than 48 hours. Throw away any unused medicine after the expiration date. NOTE: This sheet is a summary. It may not cover all possible information. If you have questions about this medicine, talk to your doctor, pharmacist, or health care provider.  2015, [employer]. (2013-08-22 16:14:05)  

## 2014-06-13 NOTE — Telephone Encounter (Signed)
Pt asked to reschedule neulasta injection to 1400 due to original injection appt being to soon after completion of chemo. Pt agrees and verbalizes understanding.

## 2014-06-20 ENCOUNTER — Ambulatory Visit (HOSPITAL_BASED_OUTPATIENT_CLINIC_OR_DEPARTMENT_OTHER): Payer: BLUE CROSS/BLUE SHIELD | Admitting: Hematology and Oncology

## 2014-06-20 ENCOUNTER — Other Ambulatory Visit (HOSPITAL_BASED_OUTPATIENT_CLINIC_OR_DEPARTMENT_OTHER): Payer: BLUE CROSS/BLUE SHIELD

## 2014-06-20 ENCOUNTER — Telehealth: Payer: Self-pay

## 2014-06-20 ENCOUNTER — Telehealth: Payer: Self-pay | Admitting: Hematology and Oncology

## 2014-06-20 ENCOUNTER — Telehealth: Payer: Self-pay | Admitting: *Deleted

## 2014-06-20 VITALS — BP 91/68 | HR 71 | Temp 98.1°F | Resp 20 | Ht 67.0 in | Wt 149.7 lb

## 2014-06-20 DIAGNOSIS — C50511 Malignant neoplasm of lower-outer quadrant of right female breast: Secondary | ICD-10-CM

## 2014-06-20 DIAGNOSIS — D709 Neutropenia, unspecified: Secondary | ICD-10-CM

## 2014-06-20 LAB — CBC WITH DIFFERENTIAL/PLATELET
BASO%: 1 % (ref 0.0–2.0)
Basophils Absolute: 0 10*3/uL (ref 0.0–0.1)
EOS ABS: 0.1 10*3/uL (ref 0.0–0.5)
EOS%: 11.2 % — AB (ref 0.0–7.0)
HCT: 36.6 % (ref 34.8–46.6)
HGB: 12 g/dL (ref 11.6–15.9)
LYMPH%: 48 % (ref 14.0–49.7)
MCH: 29.3 pg (ref 25.1–34.0)
MCHC: 32.8 g/dL (ref 31.5–36.0)
MCV: 89.5 fL (ref 79.5–101.0)
MONO#: 0.2 10*3/uL (ref 0.1–0.9)
MONO%: 15.3 % — ABNORMAL HIGH (ref 0.0–14.0)
NEUT%: 24.5 % — ABNORMAL LOW (ref 38.4–76.8)
NEUTROS ABS: 0.2 10*3/uL — AB (ref 1.5–6.5)
Platelets: 108 10*3/uL — ABNORMAL LOW (ref 145–400)
RBC: 4.09 10*6/uL (ref 3.70–5.45)
RDW: 12.8 % (ref 11.2–14.5)
WBC: 1 10*3/uL — AB (ref 3.9–10.3)
lymph#: 0.5 10*3/uL — ABNORMAL LOW (ref 0.9–3.3)

## 2014-06-20 LAB — COMPREHENSIVE METABOLIC PANEL (CC13)
ALK PHOS: 81 U/L (ref 40–150)
ALT: 14 U/L (ref 0–55)
AST: 14 U/L (ref 5–34)
Albumin: 4.1 g/dL (ref 3.5–5.0)
Anion Gap: 9 mEq/L (ref 3–11)
BILIRUBIN TOTAL: 0.24 mg/dL (ref 0.20–1.20)
BUN: 15.3 mg/dL (ref 7.0–26.0)
CO2: 30 mEq/L — ABNORMAL HIGH (ref 22–29)
Calcium: 9.2 mg/dL (ref 8.4–10.4)
Chloride: 100 mEq/L (ref 98–109)
Creatinine: 0.7 mg/dL (ref 0.6–1.1)
GLUCOSE: 85 mg/dL (ref 70–140)
Potassium: 4 mEq/L (ref 3.5–5.1)
SODIUM: 139 meq/L (ref 136–145)
Total Protein: 7.1 g/dL (ref 6.4–8.3)

## 2014-06-20 MED ORDER — ESOMEPRAZOLE MAGNESIUM 20 MG PO CPDR: 20.0000 mg | DELAYED_RELEASE_CAPSULE | Freq: Every day | ORAL | Status: DC

## 2014-06-20 NOTE — Telephone Encounter (Signed)
Patient called and left message to move appts. I have moved appts per her request. Called and left her a message

## 2014-06-20 NOTE — Telephone Encounter (Signed)
per pof to sch pt appt-gave pt copy of sch °

## 2014-06-20 NOTE — Progress Notes (Signed)
Patient Care Team: Colon Branch, MD as PCP - General  DIAGNOSIS: Breast cancer of lower-outer quadrant of right female breast   Staging form: Breast, AJCC 7th Edition     Clinical: Stage IIA (T2(m), N0, M0) - Unsigned     Pathologic: Stage IIIA (T3, N2a, cM0) - Signed by Rulon Eisenmenger, MD on 06/12/2014   SUMMARY OF ONCOLOGIC HISTORY:   Breast cancer of lower-outer quadrant of right female breast   01/31/2014 Breast MRI Right breast mass 3.3 x 2.8 x 3.4 cm with several foci of subcentimeter enhancing nodules extending anteriorly to worsen nipple up to 5.4 cm anterior to the mass    04/17/2014 Surgery Bilateral mastectomies, left: Benign, right breast multifocal ILC grade 2 largest 5.5 cm, posterior margin focally positive, 2 SLN's positive with extracapsular extension + 4/10 axillary LN positive, ER 92%, PR 98%, HER-2 -1.1.3, Ki 67 10%   06/12/2014 -  Chemotherapy Adriamycin and Cytoxan dose dense every 2 weeks x4 followed by Taxol x12    CHIEF COMPLIANT: Cycle 1 day 9 dose dense Adriamycin and Cytoxan, headaches heartburn  INTERVAL HISTORY: Olivia Acosta is a 54 year old lady with above-mentioned history of right-sided breast cancer treated with mastectomy and is now on adjuvant chemotherapy with Adriamycin and Cytoxan. She tolerated cycle 1 fairly well except for headaches that started on day 3. It is most likely attributable to Zofran. She also had acid reflux and heartburn symptoms. She denied any nausea or vomiting. She was slightly more tired than her usual. She continued to work full-time. Denies any fevers or chills.  REVIEW OF SYSTEMS:   Constitutional: Denies fevers, chills or abnormal weight loss Eyes: Denies blurriness of vision Ears, nose, mouth, throat, and face: Denies mucositis or sore throat Respiratory: Denies cough, dyspnea or wheezes Cardiovascular: Denies palpitation, chest discomfort or lower extremity swelling Gastrointestinal:  Denies nausea, heartburn or change in bowel  habits Skin: Denies abnormal skin rashes Lymphatics: Denies new lymphadenopathy or easy bruising Neurological:Denies numbness, tingling or new weaknesses Behavioral/Psych: Mood is stable, no new changes  All other systems were reviewed with the patient and are negative.  I have reviewed the past medical history, past surgical history, social history and family history with the patient and they are unchanged from previous note.  ALLERGIES:  is allergic to penicillins.  MEDICATIONS:  Current Outpatient Prescriptions  Medication Sig Dispense Refill  . ALPRAZolam (XANAX) 0.5 MG tablet Take 0.5 mg by mouth daily as needed for anxiety or sleep.    Marland Kitchen dexamethasone (DECADRON) 4 MG tablet Take 1 tablet (4 mg total) by mouth 2 (two) times daily. Take 2 tablets by mouth once a day on the day after chemotherapy and then take 2 tablets two times a day for 2 days. Take with food. 30 tablet 1  . docusate sodium 100 MG CAPS Take 100 mg by mouth daily. 10 capsule 0  . enoxaparin (LOVENOX) 40 MG/0.4ML injection   0  . HYDROcodone-acetaminophen (NORCO/VICODIN) 5-325 MG per tablet Take 1-2 tablets by mouth every 4 (four) hours as needed for moderate pain or severe pain. 30 tablet 0  . HYDROmorphone (DILAUDID) 2 MG tablet   0  . lidocaine-prilocaine (EMLA) cream Apply to affected area once 30 g 3  . LORazepam (ATIVAN) 0.5 MG tablet Take 1 tablet (0.5 mg total) by mouth every 6 (six) hours as needed (Nausea or vomiting). 30 tablet 0  . methocarbamol (ROBAXIN) 500 MG tablet Take 1 tablet (500 mg total) by mouth 4 (four)  times daily. 40 tablet 1  . ondansetron (ZOFRAN) 8 MG tablet Take 1 tablet (8 mg total) by mouth 2 (two) times daily as needed. Start on the third day after chemotherapy. 30 tablet 1  . prochlorperazine (COMPAZINE) 10 MG tablet Take 1 tablet (10 mg total) by mouth every 6 (six) hours as needed (Nausea or vomiting). 30 tablet 1  . promethazine (PHENERGAN) 25 MG tablet   0  . valACYclovir (VALTREX)  500 MG tablet Take 500 mg by mouth daily.    Marland Kitchen esomeprazole (NEXIUM) 20 MG capsule Take 1 capsule (20 mg total) by mouth daily at 12 noon. 30 capsule 1   No current facility-administered medications for this visit.    PHYSICAL EXAMINATION: ECOG PERFORMANCE STATUS: 1 - Symptomatic but completely ambulatory  Filed Vitals:   06/20/14 1136  BP: 91/68  Pulse: 71  Temp: 98.1 F (36.7 C)  Resp: 20   Filed Weights   06/20/14 1136  Weight: 149 lb 11.2 oz (67.903 kg)    GENERAL:alert, no distress and comfortable SKIN: skin color, texture, turgor are normal, no rashes or significant lesions EYES: normal, Conjunctiva are pink and non-injected, sclera clear OROPHARYNX:no exudate, no erythema and lips, buccal mucosa, and tongue normal  NECK: supple, thyroid normal size, non-tender, without nodularity LYMPH:  no palpable lymphadenopathy in the cervical, axillary or inguinal LUNGS: clear to auscultation and percussion with normal breathing effort HEART: regular rate & rhythm and no murmurs and no lower extremity edema ABDOMEN:abdomen soft, non-tender and normal bowel sounds Musculoskeletal:no cyanosis of digits and no clubbing  NEURO: alert & oriented x 3 with fluent speech, no focal motor/sensory deficits  LABORATORY DATA:  I have reviewed the data as listed   Chemistry      Component Value Date/Time   NA 139 06/20/2014 1100   NA 140 04/09/2014 0846   K 4.0 06/20/2014 1100   K 4.1 04/09/2014 0846   CL 103 04/09/2014 0846   CO2 30* 06/20/2014 1100   CO2 27 04/09/2014 0846   BUN 15.3 06/20/2014 1100   BUN 21 04/09/2014 0846   CREATININE 0.7 06/20/2014 1100   CREATININE 0.77 04/09/2014 0846      Component Value Date/Time   CALCIUM 9.2 06/20/2014 1100   CALCIUM 9.5 04/09/2014 0846   ALKPHOS 81 06/20/2014 1100   AST 14 06/20/2014 1100   ALT 14 06/20/2014 1100   BILITOT 0.24 06/20/2014 1100       Lab Results  Component Value Date   WBC 1.0* 06/20/2014   HGB 12.0 06/20/2014    HCT 36.6 06/20/2014   MCV 89.5 06/20/2014   PLT 108* 06/20/2014   NEUTROABS 0.2* 06/20/2014   ASSESSMENT & PLAN:  Breast cancer of lower-outer quadrant of right female breast Right breast invasive lobular cancer 5.5 cm multifocal disease with 6/12 lymph nodes positive T3, N2, M0 stage IIIa, ER 92%, PR 98%, HER-2 negative ratio 1.13, Ki-67 10% status post bilateral mastectomies on 04/17/2014  Current treatment:Adriamycin and Cytoxan dose dense every 2 weeks x4 followed by Taxol x12  today cycle 1 day 8 Adriamycin and Cytoxan  Toxicities of chemotherapy: 1. Grade 3 neutropenia: We'll decrease the dosage of next chemotherapy with cycle 2, Adriamycin 50 mg/m and Cytoxan 500 mg/m 2. Headache related to Zofran: Will discontinue Zofran 3. Severe heartburn: We'll prescribe Nexium. We'll decrease Decadron dosage from 2 tablets twice a day to 1 tablet twice a day  Monitoring closely for chemotoxicities Return to clinic in 1 week for cycle  2.  Orders Placed This Encounter  Procedures  . CBC with Differential    Standing Status: Future     Number of Occurrences:      Standing Expiration Date: 06/20/2015  . Comprehensive metabolic panel (Cmet) - [facility]    Standing Status: Future     Number of Occurrences:      Standing Expiration Date: 06/20/2015   The patient has a good understanding of the overall plan. she agrees with it. She will call with any problems that may develop before her next visit here.   Rulon Eisenmenger, MD

## 2014-06-20 NOTE — Assessment & Plan Note (Addendum)
Right breast invasive lobular cancer 5.5 cm multifocal disease with 6/12 lymph nodes positive T3, N2, M0 stage IIIa, ER 92%, PR 98%, HER-2 negative ratio 1.13, Ki-67 10% status post bilateral mastectomies on 04/17/2014  Current treatment:Adriamycin and Cytoxan dose dense every 2 weeks x4 followed by Taxol x12  today cycle 1 day 8 Adriamycin and Cytoxan  Toxicities of chemotherapy: 1. Grade 3 neutropenia: We'll decrease the dosage of next chemotherapy with cycle 2, Adriamycin 50 mg/m and Cytoxan 500 mg/m 2. Headache related to Zofran: Will discontinue Zofran 3. Severe heartburn: We'll prescribe Nexium. We'll decrease Decadron dosage from 2 tablets twice a day to 1 tablet twice a day  Monitoring closely for chemotoxicities Return to clinic in 1 week for cycle 2.

## 2014-06-20 NOTE — Telephone Encounter (Signed)
Certification of Health Care Provider for Employee's Serious Health Condition received from pt at [facility] today.  Put in Ebony's box.

## 2014-06-23 ENCOUNTER — Encounter: Payer: Self-pay | Admitting: Hematology and Oncology

## 2014-06-23 ENCOUNTER — Ambulatory Visit: Payer: BC Managed Care – PPO

## 2014-06-23 ENCOUNTER — Other Ambulatory Visit: Payer: BC Managed Care – PPO

## 2014-06-23 NOTE — Progress Notes (Signed)
Put fmla form on nurse's desk °

## 2014-06-24 ENCOUNTER — Ambulatory Visit: Payer: BC Managed Care – PPO

## 2014-06-25 ENCOUNTER — Ambulatory Visit (HOSPITAL_BASED_OUTPATIENT_CLINIC_OR_DEPARTMENT_OTHER): Payer: BLUE CROSS/BLUE SHIELD | Admitting: Hematology and Oncology

## 2014-06-25 ENCOUNTER — Other Ambulatory Visit (HOSPITAL_BASED_OUTPATIENT_CLINIC_OR_DEPARTMENT_OTHER): Payer: BLUE CROSS/BLUE SHIELD

## 2014-06-25 ENCOUNTER — Telehealth: Payer: Self-pay | Admitting: Hematology and Oncology

## 2014-06-25 DIAGNOSIS — D702 Other drug-induced agranulocytosis: Secondary | ICD-10-CM

## 2014-06-25 DIAGNOSIS — C50511 Malignant neoplasm of lower-outer quadrant of right female breast: Secondary | ICD-10-CM

## 2014-06-25 DIAGNOSIS — C773 Secondary and unspecified malignant neoplasm of axilla and upper limb lymph nodes: Secondary | ICD-10-CM

## 2014-06-25 LAB — COMPREHENSIVE METABOLIC PANEL (CC13)
ALBUMIN: 3.7 g/dL (ref 3.5–5.0)
ALK PHOS: 78 U/L (ref 40–150)
ALT: 14 U/L (ref 0–55)
AST: 17 U/L (ref 5–34)
Anion Gap: 8 mEq/L (ref 3–11)
BUN: 13.7 mg/dL (ref 7.0–26.0)
CALCIUM: 9 mg/dL (ref 8.4–10.4)
CO2: 30 mEq/L — ABNORMAL HIGH (ref 22–29)
Chloride: 105 mEq/L (ref 98–109)
Creatinine: 0.7 mg/dL (ref 0.6–1.1)
GLUCOSE: 80 mg/dL (ref 70–140)
Potassium: 4.1 mEq/L (ref 3.5–5.1)
Sodium: 142 mEq/L (ref 136–145)
Total Bilirubin: <0.2 mg/dL (ref 0.20–1.20)
Total Protein: 6.4 g/dL (ref 6.4–8.3)

## 2014-06-25 LAB — CBC WITH DIFFERENTIAL/PLATELET
BASO%: 1.3 % (ref 0.0–2.0)
BASOS ABS: 0.1 10*3/uL (ref 0.0–0.1)
EOS ABS: 0.1 10*3/uL (ref 0.0–0.5)
EOS%: 0.7 % (ref 0.0–7.0)
HCT: 34.9 % (ref 34.8–46.6)
HEMOGLOBIN: 11.3 g/dL — AB (ref 11.6–15.9)
LYMPH%: 14 % (ref 14.0–49.7)
MCH: 29.4 pg (ref 25.1–34.0)
MCHC: 32.4 g/dL (ref 31.5–36.0)
MCV: 90.6 fL (ref 79.5–101.0)
MONO#: 0.8 10*3/uL (ref 0.1–0.9)
MONO%: 11.7 % (ref 0.0–14.0)
NEUT%: 72.3 % (ref 38.4–76.8)
NEUTROS ABS: 5 10*3/uL (ref 1.5–6.5)
Platelets: 122 10*3/uL — ABNORMAL LOW (ref 145–400)
RBC: 3.85 10*6/uL (ref 3.70–5.45)
RDW: 13.1 % (ref 11.2–14.5)
WBC: 6.9 10*3/uL (ref 3.9–10.3)
lymph#: 1 10*3/uL (ref 0.9–3.3)

## 2014-06-25 NOTE — Telephone Encounter (Signed)
, °

## 2014-06-25 NOTE — Progress Notes (Signed)
Patient Care Team: Colon Branch, MD as PCP - General  DIAGNOSIS: Breast cancer of lower-outer quadrant of right female breast   Staging form: Breast, AJCC 7th Edition     Clinical: Stage IIA (T2(m), N0, M0) - Unsigned     Pathologic: Stage IIIA (T3, N2a, cM0) - Signed by Rulon Eisenmenger, MD on 06/12/2014   SUMMARY OF ONCOLOGIC HISTORY:   Breast cancer of lower-outer quadrant of right female breast   01/31/2014 Breast MRI Right breast mass 3.3 x 2.8 x 3.4 cm with several foci of subcentimeter enhancing nodules extending anteriorly to worsen nipple up to 5.4 cm anterior to the mass    04/17/2014 Surgery Bilateral mastectomies, left: Benign, right breast multifocal ILC grade 2 largest 5.5 cm, posterior margin focally positive, 2 SLN's positive with extracapsular extension + 4/10 axillary LN positive, ER 92%, PR 98%, HER-2 -1.1.3, Ki 67 10%   06/12/2014 -  Chemotherapy Adriamycin and Cytoxan dose dense every 2 weeks x4 followed by Taxol x12    CHIEF COMPLIANT: Tomorrow is cycle 2 Adriamycin and Cytoxan  INTERVAL HISTORY: Olivia Acosta is a 54 year old lady with above-mentioned history of right breast cancer currently on adjuvant chemotherapy with Adriamycin and Cytoxan. Tomorrow will be cycle #2. Overall she is recovered very well from the effects of prior chemotherapy. She does not have any headaches any more. Her heartburn is much improved. She denies any nausea or vomiting. Good energy levels she walked 5 miles over the weekend.  REVIEW OF SYSTEMS:   Constitutional: Denies fevers, chills or abnormal weight loss Eyes: Denies blurriness of vision Ears, nose, mouth, throat, and face: Denies mucositis or sore throat Respiratory: Denies cough, dyspnea or wheezes Cardiovascular: Denies palpitation, chest discomfort or lower extremity swelling Gastrointestinal:  Denies nausea, heartburn or change in bowel habits Skin: Denies abnormal skin rashes Lymphatics: Denies new lymphadenopathy or easy  bruising Neurological:Denies numbness, tingling or new weaknesses Behavioral/Psych: Mood is stable, no new changes  Breast:  denies any pain or lumps or nodules in either breasts All other systems were reviewed with the patient and are negative.  I have reviewed the past medical history, past surgical history, social history and family history with the patient and they are unchanged from previous note.  ALLERGIES:  is allergic to penicillins.  MEDICATIONS:  Current Outpatient Prescriptions  Medication Sig Dispense Refill  . ALPRAZolam (XANAX) 0.5 MG tablet Take 0.5 mg by mouth daily as needed for anxiety or sleep.    Marland Kitchen dexamethasone (DECADRON) 4 MG tablet Take 1 tablet (4 mg total) by mouth 2 (two) times daily. Take 2 tablets by mouth once a day on the day after chemotherapy and then take 2 tablets two times a day for 2 days. Take with food. 30 tablet 1  . docusate sodium 100 MG CAPS Take 100 mg by mouth daily. 10 capsule 0  . enoxaparin (LOVENOX) 40 MG/0.4ML injection   0  . esomeprazole (NEXIUM) 20 MG capsule Take 1 capsule (20 mg total) by mouth daily at 12 noon. 30 capsule 1  . HYDROcodone-acetaminophen (NORCO/VICODIN) 5-325 MG per tablet Take 1-2 tablets by mouth every 4 (four) hours as needed for moderate pain or severe pain. 30 tablet 0  . HYDROmorphone (DILAUDID) 2 MG tablet   0  . lidocaine-prilocaine (EMLA) cream Apply to affected area once 30 g 3  . LORazepam (ATIVAN) 0.5 MG tablet Take 1 tablet (0.5 mg total) by mouth every 6 (six) hours as needed (Nausea or vomiting). 30 tablet  0  . methocarbamol (ROBAXIN) 500 MG tablet Take 1 tablet (500 mg total) by mouth 4 (four) times daily. 40 tablet 1  . ondansetron (ZOFRAN) 8 MG tablet Take 1 tablet (8 mg total) by mouth 2 (two) times daily as needed. Start on the third day after chemotherapy. 30 tablet 1  . prochlorperazine (COMPAZINE) 10 MG tablet Take 1 tablet (10 mg total) by mouth every 6 (six) hours as needed (Nausea or vomiting). 30  tablet 1  . promethazine (PHENERGAN) 25 MG tablet   0  . valACYclovir (VALTREX) 500 MG tablet Take 500 mg by mouth daily.     No current facility-administered medications for this visit.    PHYSICAL EXAMINATION: ECOG PERFORMANCE STATUS: 0 - Asymptomatic  Filed Vitals:   06/25/14 0844  BP: 107/60  Pulse: 67  Temp: 98.5 F (36.9 C)  Resp: 18   Filed Weights   06/25/14 0844  Weight: 153 lb 11.2 oz (69.718 kg)    GENERAL:alert, no distress and comfortable SKIN: skin color, texture, turgor are normal, no rashes or significant lesions EYES: normal, Conjunctiva are pink and non-injected, sclera clear OROPHARYNX:no exudate, no erythema and lips, buccal mucosa, and tongue normal  NECK: supple, thyroid normal size, non-tender, without nodularity LYMPH:  no palpable lymphadenopathy in the cervical, axillary or inguinal LUNGS: clear to auscultation and percussion with normal breathing effort HEART: regular rate & rhythm and no murmurs and no lower extremity edema ABDOMEN:abdomen soft, non-tender and normal bowel sounds Musculoskeletal:no cyanosis of digits and no clubbing  NEURO: alert & oriented x 3 with fluent speech, no focal motor/sensory deficits  LABORATORY DATA:  I have reviewed the data as listed   Chemistry      Component Value Date/Time   NA 142 06/25/2014 0828   NA 140 04/09/2014 0846   K 4.1 06/25/2014 0828   K 4.1 04/09/2014 0846   CL 103 04/09/2014 0846   CO2 30* 06/25/2014 0828   CO2 27 04/09/2014 0846   BUN 13.7 06/25/2014 0828   BUN 21 04/09/2014 0846   CREATININE 0.7 06/25/2014 0828   CREATININE 0.77 04/09/2014 0846      Component Value Date/Time   CALCIUM 9.0 06/25/2014 0828   CALCIUM 9.5 04/09/2014 0846   ALKPHOS 78 06/25/2014 0828   AST 17 06/25/2014 0828   ALT 14 06/25/2014 0828   BILITOT <0.20 06/25/2014 0828       Lab Results  Component Value Date   WBC 6.9 06/25/2014   HGB 11.3* 06/25/2014   HCT 34.9 06/25/2014   MCV 90.6 06/25/2014    PLT 122* 06/25/2014   NEUTROABS 5.0 06/25/2014   ASSESSMENT & PLAN:  Breast cancer of lower-outer quadrant of right female breast Right breast invasive lobular cancer 5.5 cm multifocal disease with 6/12 lymph nodes positive T3, N2, M0 stage IIIa, ER 92%, PR 98%, HER-2 negative ratio 1.13, Ki-67 10% status post bilateral mastectomies on 04/17/2014  Current treatment:Adriamycin and Cytoxan dose dense every 2 weeks x4 followed by Taxol x12  Tomorrow is cycle 2 Adriamycin and Cytoxan  Toxicities of chemotherapy: 1. Grade 3 neutropenia: Chemotherapy dose reduced with cycle 2, Adriamycin 50 mg/m and Cytoxan 500 mg/m 2. Headache related to Zofran: Resolved with discontinuing Zofran 3. Severe heartburn: Improved with Nexium, Decadron dosage reduced   Monitoring closely for chemotoxicities Return to clinic in 2 weeks for cycle 3.    Orders Placed This Encounter  Procedures  . CBC with Differential    Standing Status: Future  Number of Occurrences:      Standing Expiration Date: 06/25/2015  . Comprehensive metabolic panel (Cmet) - [facility]    Standing Status: Future     Number of Occurrences:      Standing Expiration Date: 06/25/2015   The patient has a good understanding of the overall plan. she agrees with it. She will call with any problems that may develop before her next visit here.   Rulon Eisenmenger, MD

## 2014-06-25 NOTE — Assessment & Plan Note (Signed)
Right breast invasive lobular cancer 5.5 cm multifocal disease with 6/12 lymph nodes positive T3, N2, M0 stage IIIa, ER 92%, PR 98%, HER-2 negative ratio 1.13, Ki-67 10% status post bilateral mastectomies on 04/17/2014  Current treatment:Adriamycin and Cytoxan dose dense every 2 weeks x4 followed by Taxol x12  Tomorrow is cycle 2 Adriamycin and Cytoxan  Toxicities of chemotherapy: 1. Grade 3 neutropenia: Chemotherapy dose reduced with cycle 2, Adriamycin 50 mg/m and Cytoxan 500 mg/m 2. Headache related to Zofran: Resolved with discontinuing Zofran 3. Severe heartburn: Improved with Nexium, Decadron dosage reduced   Monitoring closely for chemotoxicities Return to clinic in 2 weeks for cycle 3.

## 2014-06-25 NOTE — Progress Notes (Signed)
[employer] form given to patient.

## 2014-06-26 ENCOUNTER — Other Ambulatory Visit: Payer: BC Managed Care – PPO

## 2014-06-26 ENCOUNTER — Ambulatory Visit (HOSPITAL_BASED_OUTPATIENT_CLINIC_OR_DEPARTMENT_OTHER): Payer: BLUE CROSS/BLUE SHIELD

## 2014-06-26 DIAGNOSIS — Z5111 Encounter for antineoplastic chemotherapy: Secondary | ICD-10-CM

## 2014-06-26 DIAGNOSIS — C773 Secondary and unspecified malignant neoplasm of axilla and upper limb lymph nodes: Secondary | ICD-10-CM

## 2014-06-26 DIAGNOSIS — C50511 Malignant neoplasm of lower-outer quadrant of right female breast: Secondary | ICD-10-CM

## 2014-06-26 MED ORDER — CYCLOPHOSPHAMIDE CHEMO INJECTION 1 GM
500.0000 mg/m2 | Freq: Once | INTRAMUSCULAR | Status: AC
  Administered 2014-06-26: 900 mg via INTRAVENOUS
  Filled 2014-06-26: qty 45

## 2014-06-26 MED ORDER — SODIUM CHLORIDE 0.9 % IV SOLN
150.0000 mg | Freq: Once | INTRAVENOUS | Status: AC
  Administered 2014-06-26: 150 mg via INTRAVENOUS
  Filled 2014-06-26: qty 5

## 2014-06-26 MED ORDER — DEXAMETHASONE SODIUM PHOSPHATE 20 MG/5ML IJ SOLN
INTRAMUSCULAR | Status: AC
  Filled 2014-06-26: qty 5

## 2014-06-26 MED ORDER — PALONOSETRON HCL INJECTION 0.25 MG/5ML
INTRAVENOUS | Status: AC
  Filled 2014-06-26: qty 5

## 2014-06-26 MED ORDER — SODIUM CHLORIDE 0.9 % IJ SOLN
10.0000 mL | INTRAMUSCULAR | Status: DC | PRN
  Administered 2014-06-26: 10 mL
  Filled 2014-06-26: qty 10

## 2014-06-26 MED ORDER — DOXORUBICIN HCL CHEMO IV INJECTION 2 MG/ML
50.0000 mg/m2 | Freq: Once | INTRAVENOUS | Status: AC
  Administered 2014-06-26: 90 mg via INTRAVENOUS
  Filled 2014-06-26: qty 45

## 2014-06-26 MED ORDER — SODIUM CHLORIDE 0.9 % IV SOLN
Freq: Once | INTRAVENOUS | Status: AC
  Administered 2014-06-26: 08:00:00 via INTRAVENOUS

## 2014-06-26 MED ORDER — HEPARIN SOD (PORK) LOCK FLUSH 100 UNIT/ML IV SOLN
500.0000 [IU] | Freq: Once | INTRAVENOUS | Status: AC | PRN
  Administered 2014-06-26: 500 [IU]
  Filled 2014-06-26: qty 5

## 2014-06-26 MED ORDER — DEXAMETHASONE SODIUM PHOSPHATE 20 MG/5ML IJ SOLN
12.0000 mg | Freq: Once | INTRAMUSCULAR | Status: AC
  Administered 2014-06-26: 12 mg via INTRAVENOUS

## 2014-06-26 MED ORDER — PALONOSETRON HCL INJECTION 0.25 MG/5ML
0.2500 mg | Freq: Once | INTRAVENOUS | Status: AC
  Administered 2014-06-26: 0.25 mg via INTRAVENOUS

## 2014-06-26 NOTE — Patient Instructions (Signed)
[facility] Discharge Instructions for Patients Receiving Chemotherapy  Today you received the following chemotherapy agents Adriamycin and Cytoxan.  To help prevent nausea and vomiting after your treatment, we encourage you to take your nausea medication as prescribed.   If you develop nausea and vomiting that is not controlled by your nausea medication, call the clinic.   BELOW ARE SYMPTOMS THAT SHOULD BE REPORTED IMMEDIATELY:  *FEVER GREATER THAN 100.5 F  *CHILLS WITH OR WITHOUT FEVER  NAUSEA AND VOMITING THAT IS NOT CONTROLLED WITH YOUR NAUSEA MEDICATION  *UNUSUAL SHORTNESS OF BREATH  *UNUSUAL BRUISING OR BLEEDING  TENDERNESS IN MOUTH AND THROAT WITH OR WITHOUT PRESENCE OF ULCERS  *URINARY PROBLEMS  *BOWEL PROBLEMS  UNUSUAL RASH Items with * indicate a potential emergency and should be followed up as soon as possible.  Feel free to call the clinic you have any questions or concerns. The clinic phone number is [phone].    

## 2014-06-27 ENCOUNTER — Ambulatory Visit (HOSPITAL_BASED_OUTPATIENT_CLINIC_OR_DEPARTMENT_OTHER): Payer: BLUE CROSS/BLUE SHIELD

## 2014-06-27 ENCOUNTER — Ambulatory Visit: Payer: BC Managed Care – PPO

## 2014-06-27 DIAGNOSIS — Z5189 Encounter for other specified aftercare: Secondary | ICD-10-CM

## 2014-06-27 DIAGNOSIS — C50511 Malignant neoplasm of lower-outer quadrant of right female breast: Secondary | ICD-10-CM

## 2014-06-27 DIAGNOSIS — C773 Secondary and unspecified malignant neoplasm of axilla and upper limb lymph nodes: Secondary | ICD-10-CM

## 2014-06-27 MED ORDER — PEGFILGRASTIM INJECTION 6 MG/0.6ML ~~LOC~~
6.0000 mg | PREFILLED_SYRINGE | Freq: Once | SUBCUTANEOUS | Status: AC
  Administered 2014-06-27: 6 mg via SUBCUTANEOUS
  Filled 2014-06-27: qty 0.6

## 2014-07-10 ENCOUNTER — Ambulatory Visit (HOSPITAL_BASED_OUTPATIENT_CLINIC_OR_DEPARTMENT_OTHER): Payer: BLUE CROSS/BLUE SHIELD | Admitting: Hematology and Oncology

## 2014-07-10 ENCOUNTER — Telehealth: Payer: Self-pay | Admitting: Hematology and Oncology

## 2014-07-10 ENCOUNTER — Other Ambulatory Visit: Payer: Self-pay | Admitting: Hematology and Oncology

## 2014-07-10 ENCOUNTER — Ambulatory Visit (HOSPITAL_BASED_OUTPATIENT_CLINIC_OR_DEPARTMENT_OTHER): Payer: BLUE CROSS/BLUE SHIELD

## 2014-07-10 ENCOUNTER — Other Ambulatory Visit (HOSPITAL_BASED_OUTPATIENT_CLINIC_OR_DEPARTMENT_OTHER): Payer: BLUE CROSS/BLUE SHIELD

## 2014-07-10 DIAGNOSIS — C773 Secondary and unspecified malignant neoplasm of axilla and upper limb lymph nodes: Secondary | ICD-10-CM

## 2014-07-10 DIAGNOSIS — C50511 Malignant neoplasm of lower-outer quadrant of right female breast: Secondary | ICD-10-CM

## 2014-07-10 DIAGNOSIS — Z5111 Encounter for antineoplastic chemotherapy: Secondary | ICD-10-CM

## 2014-07-10 DIAGNOSIS — R12 Heartburn: Secondary | ICD-10-CM

## 2014-07-10 DIAGNOSIS — R51 Headache: Secondary | ICD-10-CM

## 2014-07-10 LAB — CBC WITH DIFFERENTIAL/PLATELET
BASO%: 0.7 % (ref 0.0–2.0)
Basophils Absolute: 0.1 10*3/uL (ref 0.0–0.1)
EOS ABS: 0.1 10*3/uL (ref 0.0–0.5)
EOS%: 0.5 % (ref 0.0–7.0)
HCT: 35.7 % (ref 34.8–46.6)
HGB: 11.5 g/dL — ABNORMAL LOW (ref 11.6–15.9)
LYMPH#: 0.8 10*3/uL — AB (ref 0.9–3.3)
LYMPH%: 5.6 % — ABNORMAL LOW (ref 14.0–49.7)
MCH: 29 pg (ref 25.1–34.0)
MCHC: 32.2 g/dL (ref 31.5–36.0)
MCV: 90 fL (ref 79.5–101.0)
MONO#: 1.1 10*3/uL — ABNORMAL HIGH (ref 0.1–0.9)
MONO%: 7 % (ref 0.0–14.0)
NEUT%: 86.2 % — ABNORMAL HIGH (ref 38.4–76.8)
NEUTROS ABS: 13 10*3/uL — AB (ref 1.5–6.5)
PLATELETS: 192 10*3/uL (ref 145–400)
RBC: 3.96 10*6/uL (ref 3.70–5.45)
RDW: 14.3 % (ref 11.2–14.5)
WBC: 15 10*3/uL — AB (ref 3.9–10.3)

## 2014-07-10 LAB — COMPREHENSIVE METABOLIC PANEL (CC13)
ALBUMIN: 4 g/dL (ref 3.5–5.0)
ALK PHOS: 105 U/L (ref 40–150)
ALT: 14 U/L (ref 0–55)
AST: 15 U/L (ref 5–34)
Anion Gap: 11 mEq/L (ref 3–11)
BUN: 16.3 mg/dL (ref 7.0–26.0)
CO2: 29 mEq/L (ref 22–29)
Calcium: 9.2 mg/dL (ref 8.4–10.4)
Chloride: 103 mEq/L (ref 98–109)
Creatinine: 0.8 mg/dL (ref 0.6–1.1)
EGFR: >90 mL/min/{1.73_m2} (ref >90)
Glucose: 89 mg/dl (ref 70–140)
Potassium: 3.9 mEq/L (ref 3.5–5.1)
Sodium: 143 mEq/L (ref 136–145)
TOTAL PROTEIN: 6.9 g/dL (ref 6.4–8.3)
Total Bilirubin: <0.2 mg/dL (ref 0.20–1.20)

## 2014-07-10 MED ORDER — SODIUM CHLORIDE 0.9 % IV SOLN
Freq: Once | INTRAVENOUS | Status: AC
  Administered 2014-07-10: 16:00:00 via INTRAVENOUS

## 2014-07-10 MED ORDER — HEPARIN SOD (PORK) LOCK FLUSH 100 UNIT/ML IV SOLN
500.0000 [IU] | Freq: Once | INTRAVENOUS | Status: AC | PRN
  Administered 2014-07-10: 500 [IU]
  Filled 2014-07-10: qty 5

## 2014-07-10 MED ORDER — SODIUM CHLORIDE 0.9 % IJ SOLN
10.0000 mL | INTRAMUSCULAR | Status: DC | PRN
  Administered 2014-07-10: 10 mL
  Filled 2014-07-10: qty 10

## 2014-07-10 MED ORDER — DEXAMETHASONE SODIUM PHOSPHATE 20 MG/5ML IJ SOLN
12.0000 mg | Freq: Once | INTRAMUSCULAR | Status: AC
  Administered 2014-07-10: 12 mg via INTRAVENOUS

## 2014-07-10 MED ORDER — ESOMEPRAZOLE MAGNESIUM 20 MG PO CPDR: 20.0000 mg | DELAYED_RELEASE_CAPSULE | Freq: Every day | ORAL | Status: DC

## 2014-07-10 MED ORDER — SODIUM CHLORIDE 0.9 % IV SOLN
500.0000 mg/m2 | Freq: Once | INTRAVENOUS | Status: AC
  Administered 2014-07-10: 900 mg via INTRAVENOUS
  Filled 2014-07-10: qty 45

## 2014-07-10 MED ORDER — PALONOSETRON HCL INJECTION 0.25 MG/5ML
0.2500 mg | Freq: Once | INTRAVENOUS | Status: AC
  Administered 2014-07-10: 0.25 mg via INTRAVENOUS

## 2014-07-10 MED ORDER — PALONOSETRON HCL INJECTION 0.25 MG/5ML
INTRAVENOUS | Status: AC
  Filled 2014-07-10: qty 5

## 2014-07-10 MED ORDER — DOXORUBICIN HCL CHEMO IV INJECTION 2 MG/ML
50.0000 mg/m2 | Freq: Once | INTRAVENOUS | Status: AC
  Administered 2014-07-10: 90 mg via INTRAVENOUS
  Filled 2014-07-10: qty 45

## 2014-07-10 MED ORDER — SODIUM CHLORIDE 0.9 % IV SOLN
150.0000 mg | Freq: Once | INTRAVENOUS | Status: AC
  Administered 2014-07-10: 150 mg via INTRAVENOUS
  Filled 2014-07-10: qty 5

## 2014-07-10 MED ORDER — DEXAMETHASONE SODIUM PHOSPHATE 20 MG/5ML IJ SOLN
INTRAMUSCULAR | Status: AC
  Filled 2014-07-10: qty 5

## 2014-07-10 NOTE — Telephone Encounter (Signed)
returned pt call re changing time of 2/6 inj appt. s/w pt and gave her new time for 2/6 inj appt @ 8:30am. other appts remain the same.

## 2014-07-10 NOTE — Patient Instructions (Signed)
[facility] Discharge Instructions for Patients Receiving Chemotherapy  Today you received the following chemotherapy agents:  Adriamycin and Cytoxan  To help prevent nausea and vomiting after your treatment, we encourage you to take your nausea medication as ordered per MD.   If you develop nausea and vomiting that is not controlled by your nausea medication, call the clinic.   BELOW ARE SYMPTOMS THAT SHOULD BE REPORTED IMMEDIATELY:  *FEVER GREATER THAN 100.5 F  *CHILLS WITH OR WITHOUT FEVER  NAUSEA AND VOMITING THAT IS NOT CONTROLLED WITH YOUR NAUSEA MEDICATION  *UNUSUAL SHORTNESS OF BREATH  *UNUSUAL BRUISING OR BLEEDING  TENDERNESS IN MOUTH AND THROAT WITH OR WITHOUT PRESENCE OF ULCERS  *URINARY PROBLEMS  *BOWEL PROBLEMS  UNUSUAL RASH Items with * indicate a potential emergency and should be followed up as soon as possible.  Feel free to call the clinic you have any questions or concerns. The clinic phone number is [phone].    

## 2014-07-10 NOTE — Progress Notes (Signed)
Patient Care Team: Jose E Paz, MD as PCP - General  DIAGNOSIS: Breast cancer of lower-outer quadrant of right female breast   Staging form: Breast, AJCC 7th Edition     Clinical: Stage IIA (T2(m), N0, M0) - Unsigned     Pathologic: Stage IIIA (T3, N2a, cM0) - Signed by Vinay K Gudena, MD on 06/12/2014   SUMMARY OF ONCOLOGIC HISTORY:   Breast cancer of lower-outer quadrant of right female breast   01/31/2014 Breast MRI Right breast mass 3.3 x 2.8 x 3.4 cm with several foci of subcentimeter enhancing nodules extending anteriorly to worsen nipple up to 5.4 cm anterior to the mass    04/17/2014 Surgery Bilateral mastectomies, left: Benign, right breast multifocal ILC grade 2 largest 5.5 cm, posterior margin focally positive, 2 SLN's positive with extracapsular extension + 4/10 axillary LN positive, ER 92%, PR 98%, HER-2 -1.1.3, Ki 67 10%   06/12/2014 -  Chemotherapy Adriamycin and Cytoxan dose dense every 2 weeks x4 followed by Taxol x12    CHIEF COMPLIANT: cycle 3 Adriamycin and Cytoxan  INTERVAL HISTORY: Olivia Acosta is a 54-year-old lady with above-mentioned history of right-sided breast cancer treated with bilateral mastectomies and is now on adjuvant chemotherapy with dose dense Adriamycin and Cytoxan. Today is cycle #3. She has tolerated chemotherapy very well without any major problems or concerns. She denies any nausea or vomiting. Denies any diarrhea or constipation. Her only complaint is heartburn. She has been taking Maalox and Pepcid but it hasn't really helped her.  REVIEW OF SYSTEMS:   Constitutional: Denies fevers, chills or abnormal weight loss Eyes: Denies blurriness of vision Ears, nose, mouth, throat, and face: Denies mucositis or sore throat Respiratory: Denies cough, dyspnea or wheezes Cardiovascular: Denies palpitation, chest discomfort or lower extremity swelling Gastrointestinal:  Denies nausea, heartburn or change in bowel habits, heartburn present Skin: Denies abnormal  skin rashes Lymphatics: Denies new lymphadenopathy or easy bruising Neurological:Denies numbness, tingling or new weaknesses Behavioral/Psych: Mood is stable, no new changes  Breast:  denies any pain or lumps or nodules in either breasts All other systems were reviewed with the patient and are negative.  I have reviewed the past medical history, past surgical history, social history and family history with the patient and they are unchanged from previous note.  ALLERGIES:  is allergic to penicillins.  MEDICATIONS:  Current Outpatient Prescriptions  Medication Sig Dispense Refill  . ALPRAZolam (XANAX) 0.5 MG tablet Take 0.5 mg by mouth daily as needed for anxiety or sleep.    . dexamethasone (DECADRON) 4 MG tablet Take 1 tablet (4 mg total) by mouth 2 (two) times daily. Take 2 tablets by mouth once a day on the day after chemotherapy and then take 2 tablets two times a day for 2 days. Take with food. 30 tablet 1  . docusate sodium 100 MG CAPS Take 100 mg by mouth daily. 10 capsule 0  . enoxaparin (LOVENOX) 40 MG/0.4ML injection   0  . esomeprazole (NEXIUM) 20 MG capsule Take 1 capsule (20 mg total) by mouth daily at 12 noon. 30 capsule 1  . HYDROcodone-acetaminophen (NORCO/VICODIN) 5-325 MG per tablet Take 1-2 tablets by mouth every 4 (four) hours as needed for moderate pain or severe pain. 30 tablet 0  . HYDROmorphone (DILAUDID) 2 MG tablet   0  . lidocaine-prilocaine (EMLA) cream Apply to affected area once 30 g 3  . LORazepam (ATIVAN) 0.5 MG tablet Take 1 tablet (0.5 mg total) by mouth every 6 (six) hours   as needed (Nausea or vomiting). 30 tablet 0  . methocarbamol (ROBAXIN) 500 MG tablet Take 1 tablet (500 mg total) by mouth 4 (four) times daily. 40 tablet 1  . ondansetron (ZOFRAN) 8 MG tablet Take 1 tablet (8 mg total) by mouth 2 (two) times daily as needed. Start on the third day after chemotherapy. 30 tablet 1  . prochlorperazine (COMPAZINE) 10 MG tablet Take 1 tablet (10 mg total) by  mouth every 6 (six) hours as needed (Nausea or vomiting). 30 tablet 1  . promethazine (PHENERGAN) 25 MG tablet   0  . valACYclovir (VALTREX) 500 MG tablet Take 500 mg by mouth daily.     No current facility-administered medications for this visit.    PHYSICAL EXAMINATION: ECOG PERFORMANCE STATUS: 1 - Symptomatic but completely ambulatory  Filed Vitals:   07/10/14 1428  BP: 99/54  Pulse: 76  Temp: 98.1 F (36.7 C)  Resp: 18   Filed Weights   07/10/14 1428  Weight: 153 lb 8 oz (69.627 kg)    GENERAL:alert, no distress and comfortable SKIN: skin color, texture, turgor are normal, no rashes or significant lesions EYES: normal, Conjunctiva are pink and non-injected, sclera clear OROPHARYNX:no exudate, no erythema and lips, buccal mucosa, and tongue normal  NECK: supple, thyroid normal size, non-tender, without nodularity LYMPH:  no palpable lymphadenopathy in the cervical, axillary or inguinal LUNGS: clear to auscultation and percussion with normal breathing effort HEART: regular rate & rhythm and no murmurs and no lower extremity edema ABDOMEN:abdomen soft, non-tender and normal bowel sounds Musculoskeletal:no cyanosis of digits and no clubbing  NEURO: alert & oriented x 3 with fluent speech, no focal motor/sensory deficits  LABORATORY DATA:  I have reviewed the data as listed   Chemistry      Component Value Date/Time   NA 142 06/25/2014 0828   NA 140 04/09/2014 0846   K 4.1 06/25/2014 0828   K 4.1 04/09/2014 0846   CL 103 04/09/2014 0846   CO2 30* 06/25/2014 0828   CO2 27 04/09/2014 0846   BUN 13.7 06/25/2014 0828   BUN 21 04/09/2014 0846   CREATININE 0.7 06/25/2014 0828   CREATININE 0.77 04/09/2014 0846      Component Value Date/Time   CALCIUM 9.0 06/25/2014 0828   CALCIUM 9.5 04/09/2014 0846   ALKPHOS 78 06/25/2014 0828   AST 17 06/25/2014 0828   ALT 14 06/25/2014 0828   BILITOT <0.20 06/25/2014 0828       Lab Results  Component Value Date   WBC 15.0*  07/10/2014   HGB 11.5* 07/10/2014   HCT 35.7 07/10/2014   MCV 90.0 07/10/2014   PLT 192 07/10/2014   NEUTROABS 13.0* 07/10/2014   ASSESSMENT & PLAN:  Breast cancer of lower-outer quadrant of right female breast Right breast invasive lobular cancer 5.5 cm multifocal disease with 6/12 lymph nodes positive T3, N2, M0 stage IIIa, ER 92%, PR 98%, HER-2 negative ratio 1.13, Ki-67 10% status post bilateral mastectomies on 04/17/2014  Current treatment:Adriamycin and Cytoxan dose dense every 2 weeks x4 followed by Taxol x12  Tomorrow is cycle 3 Adriamycin and Cytoxan  Toxicities of chemotherapy: 1. Grade 3 neutropenia: Chemotherapy dose reduced with cycle 2, Adriamycin 50 mg/m and Cytoxan 500 mg/m 2. Headache related to Zofran: Resolved with discontinuing Zofran 3. Severe heartburn: prescribed Nexium 20 mg daily, Decadron discontinued 4. Small mouth sores  Blood counts are adequate for treatment today. Monitoring closely for chemotoxicities Return to clinic in 4 weeks for cycle 1 of  Taxol. She will come in 2 weeks for cycle 4 of Adriamycin and Cytoxan.    Orders Placed This Encounter  Procedures  . CBC with Differential    Standing Status: Future     Number of Occurrences:      Standing Expiration Date: 07/10/2015  . Comprehensive metabolic panel (Cmet) - [facility]    Standing Status: Future     Number of Occurrences:      Standing Expiration Date: 07/10/2015   The patient has a good understanding of the overall plan. she agrees with it. She will call with any problems that may develop before her next visit here.   Gudena, Vinay K, MD    

## 2014-07-10 NOTE — Assessment & Plan Note (Signed)
Right breast invasive lobular cancer 5.5 cm multifocal disease with 6/12 lymph nodes positive T3, N2, M0 stage IIIa, ER 92%, PR 98%, HER-2 negative ratio 1.13, Ki-67 10% status post bilateral mastectomies on 04/17/2014  Current treatment:Adriamycin and Cytoxan dose dense every 2 weeks x4 followed by Taxol x12  Tomorrow is cycle 3 Adriamycin and Cytoxan  Toxicities of chemotherapy: 1. Grade 3 neutropenia: Chemotherapy dose reduced with cycle 2, Adriamycin 50 mg/m and Cytoxan 500 mg/m 2. Headache related to Zofran: Resolved with discontinuing Zofran 3. Severe heartburn: prescribed Nexium 20 mg daily, Decadron discontinued Blood counts are adequate for treatment today. Monitoring closely for chemotoxicities Return to clinic in 4 weeks for cycle 1 of Taxol. She will come in 2 weeks for cycle 4 of Adriamycin and Cytoxan.

## 2014-07-10 NOTE — Telephone Encounter (Signed)
per pof to sch pt appt-sent MW email to sch pt appt-pt has MY CHART and will review sch

## 2014-07-11 ENCOUNTER — Telehealth: Payer: Self-pay | Admitting: *Deleted

## 2014-07-11 NOTE — Telephone Encounter (Signed)
Per staff message and POF I have scheduled appts. Advised scheduler of appts. JMW  

## 2014-07-11 NOTE — Telephone Encounter (Signed)
Patient called and left message to move her appts. I have moved appts and called her with new times

## 2014-07-12 ENCOUNTER — Ambulatory Visit (HOSPITAL_BASED_OUTPATIENT_CLINIC_OR_DEPARTMENT_OTHER): Payer: BLUE CROSS/BLUE SHIELD

## 2014-07-12 DIAGNOSIS — C50511 Malignant neoplasm of lower-outer quadrant of right female breast: Secondary | ICD-10-CM

## 2014-07-12 DIAGNOSIS — C773 Secondary and unspecified malignant neoplasm of axilla and upper limb lymph nodes: Secondary | ICD-10-CM

## 2014-07-12 DIAGNOSIS — Z5189 Encounter for other specified aftercare: Secondary | ICD-10-CM

## 2014-07-12 MED ORDER — PEGFILGRASTIM INJECTION 6 MG/0.6ML ~~LOC~~
6.0000 mg | PREFILLED_SYRINGE | Freq: Once | SUBCUTANEOUS | Status: AC
  Administered 2014-07-12: 6 mg via SUBCUTANEOUS

## 2014-07-12 NOTE — Patient Instructions (Signed)
Pegfilgrastim injection What is this medicine? PEGFILGRASTIM (peg fil GRA stim) is a long-acting granulocyte colony-stimulating factor that stimulates the growth of neutrophils, a type of white blood cell important in the body's fight against infection. It is used to reduce the incidence of fever and infection in patients with certain types of cancer who are receiving chemotherapy that affects the bone marrow. This medicine may be used for other purposes; ask your health care provider or pharmacist if you have questions. COMMON BRAND NAME(S): Neulasta What should I tell my health care provider before I take this medicine? They need to know if you have any of these conditions: -latex allergy -ongoing radiation therapy -sickle cell disease -skin reactions to acrylic adhesives (On-Body Injector only) -an unusual or allergic reaction to pegfilgrastim, filgrastim, other medicines, foods, dyes, or preservatives -pregnant or trying to get pregnant -breast-feeding How should I use this medicine? This medicine is for injection under the skin. If you get this medicine at home, you will be taught how to prepare and give the pre-filled syringe or how to use the On-body Injector. Refer to the patient Instructions for Use for detailed instructions. Use exactly as directed. Take your medicine at regular intervals. Do not take your medicine more often than directed. It is important that you put your used needles and syringes in a special sharps container. Do not put them in a trash can. If you do not have a sharps container, call your pharmacist or healthcare provider to get one. Talk to your pediatrician regarding the use of this medicine in children. Special care may be needed. Overdosage: If you think you have taken too much of this medicine contact a poison control center or emergency room at once. NOTE: This medicine is only for you. Do not share this medicine with others. What if I miss a dose? It is  important not to miss your dose. Call your doctor or health care professional if you miss your dose. If you miss a dose due to an On-body Injector failure or leakage, a new dose should be administered as soon as possible using a single prefilled syringe for manual use. What may interact with this medicine? Interactions have not been studied. Give your health care provider a list of all the medicines, herbs, non-prescription drugs, or dietary supplements you use. Also tell them if you smoke, drink alcohol, or use illegal drugs. Some items may interact with your medicine. This list may not describe all possible interactions. Give your health care provider a list of all the medicines, herbs, non-prescription drugs, or dietary supplements you use. Also tell them if you smoke, drink alcohol, or use illegal drugs. Some items may interact with your medicine. What should I watch for while using this medicine? You may need blood work done while you are taking this medicine. If you are going to need a MRI, CT scan, or other procedure, tell your doctor that you are using this medicine (On-Body Injector only). What side effects may I notice from receiving this medicine? Side effects that you should report to your doctor or health care professional as soon as possible: -allergic reactions like skin rash, itching or hives, swelling of the face, lips, or tongue -dizziness -fever -pain, redness, or irritation at site where injected -pinpoint red spots on the skin -shortness of breath or breathing problems -stomach or side pain, or pain at the shoulder -swelling -tiredness -trouble passing urine Side effects that usually do not require medical attention (report to your doctor   or health care professional if they continue or are bothersome): -bone pain -muscle pain This list may not describe all possible side effects. Call your doctor for medical advice about side effects. You may report side effects to FDA at  1-800-FDA-1088. Where should I keep my medicine? Keep out of the reach of children. Store pre-filled syringes in a refrigerator between 2 and 8 degrees C (36 and 46 degrees F). Do not freeze. Keep in carton to protect from light. Throw away this medicine if it is left out of the refrigerator for more than 48 hours. Throw away any unused medicine after the expiration date. NOTE: This sheet is a summary. It may not cover all possible information. If you have questions about this medicine, talk to your doctor, pharmacist, or health care provider.  2015, [employer]. (2013-08-22 16:14:05)  

## 2014-07-24 ENCOUNTER — Other Ambulatory Visit (HOSPITAL_BASED_OUTPATIENT_CLINIC_OR_DEPARTMENT_OTHER): Payer: BLUE CROSS/BLUE SHIELD

## 2014-07-24 ENCOUNTER — Other Ambulatory Visit: Payer: BLUE CROSS/BLUE SHIELD

## 2014-07-24 ENCOUNTER — Ambulatory Visit: Payer: BLUE CROSS/BLUE SHIELD

## 2014-07-24 ENCOUNTER — Ambulatory Visit (HOSPITAL_BASED_OUTPATIENT_CLINIC_OR_DEPARTMENT_OTHER): Payer: BLUE CROSS/BLUE SHIELD

## 2014-07-24 DIAGNOSIS — C50811 Malignant neoplasm of overlapping sites of right female breast: Secondary | ICD-10-CM

## 2014-07-24 DIAGNOSIS — C773 Secondary and unspecified malignant neoplasm of axilla and upper limb lymph nodes: Secondary | ICD-10-CM

## 2014-07-24 DIAGNOSIS — C50511 Malignant neoplasm of lower-outer quadrant of right female breast: Secondary | ICD-10-CM

## 2014-07-24 DIAGNOSIS — Z5111 Encounter for antineoplastic chemotherapy: Secondary | ICD-10-CM

## 2014-07-24 LAB — CBC WITH DIFFERENTIAL/PLATELET
BASO%: 0.7 % (ref 0.0–2.0)
Basophils Absolute: 0.1 10*3/uL (ref 0.0–0.1)
EOS ABS: 0.2 10*3/uL (ref 0.0–0.5)
EOS%: 0.9 % (ref 0.0–7.0)
HEMATOCRIT: 34 % — AB (ref 34.8–46.6)
HGB: 10.7 g/dL — ABNORMAL LOW (ref 11.6–15.9)
LYMPH#: 0.7 10*3/uL — AB (ref 0.9–3.3)
LYMPH%: 3.7 % — AB (ref 14.0–49.7)
MCH: 28.5 pg (ref 25.1–34.0)
MCHC: 31.6 g/dL (ref 31.5–36.0)
MCV: 90.3 fL (ref 79.5–101.0)
MONO#: 1.2 10*3/uL — AB (ref 0.1–0.9)
MONO%: 6.8 % (ref 0.0–14.0)
NEUT%: 87.9 % — ABNORMAL HIGH (ref 38.4–76.8)
NEUTROS ABS: 15.5 10*3/uL — AB (ref 1.5–6.5)
PLATELETS: 190 10*3/uL (ref 145–400)
RBC: 3.77 10*6/uL (ref 3.70–5.45)
RDW: 15.8 % — ABNORMAL HIGH (ref 11.2–14.5)
WBC: 17.6 10*3/uL — AB (ref 3.9–10.3)

## 2014-07-24 LAB — COMPREHENSIVE METABOLIC PANEL (CC13)
ALK PHOS: 100 U/L (ref 40–150)
ALT: 14 U/L (ref 0–55)
AST: 19 U/L (ref 5–34)
Albumin: 3.9 g/dL (ref 3.5–5.0)
Anion Gap: 7 mEq/L (ref 3–11)
BUN: 12.5 mg/dL (ref 7.0–26.0)
CO2: 28 meq/L (ref 22–29)
CREATININE: 0.7 mg/dL (ref 0.6–1.1)
Calcium: 9.1 mg/dL (ref 8.4–10.4)
Chloride: 108 mEq/L (ref 98–109)
EGFR: >90 mL/min/{1.73_m2} (ref >90)
GLUCOSE: 89 mg/dL (ref 70–140)
Potassium: 3.9 mEq/L (ref 3.5–5.1)
Sodium: 143 mEq/L (ref 136–145)
Total Bilirubin: <0.2 mg/dL (ref 0.20–1.20)
Total Protein: 6.5 g/dL (ref 6.4–8.3)

## 2014-07-24 MED ORDER — SODIUM CHLORIDE 0.9 % IV SOLN
Freq: Once | INTRAVENOUS | Status: AC
  Administered 2014-07-24: 15:00:00 via INTRAVENOUS

## 2014-07-24 MED ORDER — SODIUM CHLORIDE 0.9 % IV SOLN
150.0000 mg | Freq: Once | INTRAVENOUS | Status: AC
  Administered 2014-07-24: 150 mg via INTRAVENOUS
  Filled 2014-07-24: qty 5

## 2014-07-24 MED ORDER — SODIUM CHLORIDE 0.9 % IJ SOLN
10.0000 mL | INTRAMUSCULAR | Status: DC | PRN
  Administered 2014-07-24: 10 mL
  Filled 2014-07-24: qty 10

## 2014-07-24 MED ORDER — DOXORUBICIN HCL CHEMO IV INJECTION 2 MG/ML
50.0000 mg/m2 | Freq: Once | INTRAVENOUS | Status: AC
  Administered 2014-07-24: 90 mg via INTRAVENOUS
  Filled 2014-07-24: qty 45

## 2014-07-24 MED ORDER — DEXAMETHASONE SODIUM PHOSPHATE 20 MG/5ML IJ SOLN
12.0000 mg | Freq: Once | INTRAMUSCULAR | Status: AC
  Administered 2014-07-24: 12 mg via INTRAVENOUS

## 2014-07-24 MED ORDER — PALONOSETRON HCL INJECTION 0.25 MG/5ML
0.2500 mg | Freq: Once | INTRAVENOUS | Status: AC
  Administered 2014-07-24: 0.25 mg via INTRAVENOUS

## 2014-07-24 MED ORDER — HEPARIN SOD (PORK) LOCK FLUSH 100 UNIT/ML IV SOLN
500.0000 [IU] | Freq: Once | INTRAVENOUS | Status: AC | PRN
  Administered 2014-07-24: 500 [IU]
  Filled 2014-07-24: qty 5

## 2014-07-24 MED ORDER — DEXAMETHASONE SODIUM PHOSPHATE 20 MG/5ML IJ SOLN
INTRAMUSCULAR | Status: AC
  Filled 2014-07-24: qty 5

## 2014-07-24 MED ORDER — PALONOSETRON HCL INJECTION 0.25 MG/5ML
INTRAVENOUS | Status: AC
  Filled 2014-07-24: qty 5

## 2014-07-24 MED ORDER — CYCLOPHOSPHAMIDE CHEMO INJECTION 1 GM
500.0000 mg/m2 | Freq: Once | INTRAMUSCULAR | Status: AC
  Administered 2014-07-24: 900 mg via INTRAVENOUS
  Filled 2014-07-24: qty 45

## 2014-07-24 NOTE — Patient Instructions (Signed)
[facility] Discharge Instructions for Patients Receiving Chemotherapy  Today you received the following chemotherapy agents adriamycin/cytoxan  To help prevent nausea and vomiting after your treatment, we encourage you to take your nausea medication as directed  If you develop nausea and vomiting that is not controlled by your nausea medication, call the clinic.   BELOW ARE SYMPTOMS THAT SHOULD BE REPORTED IMMEDIATELY:  *FEVER GREATER THAN 100.5 F  *CHILLS WITH OR WITHOUT FEVER  NAUSEA AND VOMITING THAT IS NOT CONTROLLED WITH YOUR NAUSEA MEDICATION  *UNUSUAL SHORTNESS OF BREATH  *UNUSUAL BRUISING OR BLEEDING  TENDERNESS IN MOUTH AND THROAT WITH OR WITHOUT PRESENCE OF ULCERS  *URINARY PROBLEMS  *BOWEL PROBLEMS  UNUSUAL RASH Items with * indicate a potential emergency and should be followed up as soon as possible.  Feel free to call the clinic you have any questions or concerns. The clinic phone number is [phone].  

## 2014-07-25 ENCOUNTER — Ambulatory Visit: Payer: BLUE CROSS/BLUE SHIELD

## 2014-07-26 ENCOUNTER — Ambulatory Visit (HOSPITAL_BASED_OUTPATIENT_CLINIC_OR_DEPARTMENT_OTHER): Payer: BLUE CROSS/BLUE SHIELD

## 2014-07-26 DIAGNOSIS — C773 Secondary and unspecified malignant neoplasm of axilla and upper limb lymph nodes: Secondary | ICD-10-CM

## 2014-07-26 DIAGNOSIS — C50511 Malignant neoplasm of lower-outer quadrant of right female breast: Secondary | ICD-10-CM

## 2014-07-26 DIAGNOSIS — Z5189 Encounter for other specified aftercare: Secondary | ICD-10-CM

## 2014-07-26 MED ORDER — PEGFILGRASTIM INJECTION 6 MG/0.6ML ~~LOC~~
6.0000 mg | PREFILLED_SYRINGE | Freq: Once | SUBCUTANEOUS | Status: AC
  Administered 2014-07-26: 6 mg via SUBCUTANEOUS

## 2014-08-06 ENCOUNTER — Telehealth: Payer: Self-pay | Admitting: *Deleted

## 2014-08-06 ENCOUNTER — Telehealth: Payer: Self-pay | Admitting: Hematology and Oncology

## 2014-08-06 ENCOUNTER — Ambulatory Visit (HOSPITAL_BASED_OUTPATIENT_CLINIC_OR_DEPARTMENT_OTHER): Payer: BLUE CROSS/BLUE SHIELD | Admitting: Hematology and Oncology

## 2014-08-06 ENCOUNTER — Other Ambulatory Visit: Payer: BLUE CROSS/BLUE SHIELD

## 2014-08-06 VITALS — BP 108/50 | HR 72 | Temp 98.2°F | Resp 18 | Ht 67.0 in | Wt 157.2 lb

## 2014-08-06 DIAGNOSIS — Z170 Estrogen receptor positive status [ER+]: Secondary | ICD-10-CM

## 2014-08-06 DIAGNOSIS — C773 Secondary and unspecified malignant neoplasm of axilla and upper limb lymph nodes: Secondary | ICD-10-CM

## 2014-08-06 DIAGNOSIS — C50511 Malignant neoplasm of lower-outer quadrant of right female breast: Secondary | ICD-10-CM

## 2014-08-06 LAB — COMPREHENSIVE METABOLIC PANEL (CC13)
ALT: 9 U/L (ref 0–55)
AST: 17 U/L (ref 5–34)
Albumin: 3.6 g/dL (ref 3.5–5.0)
Alkaline Phosphatase: 89 U/L (ref 40–150)
Anion Gap: 10 mEq/L (ref 3–11)
BUN: 12.8 mg/dL (ref 7.0–26.0)
CALCIUM: 9.2 mg/dL (ref 8.4–10.4)
CHLORIDE: 107 meq/L (ref 98–109)
CO2: 27 mEq/L (ref 22–29)
Creatinine: 0.7 mg/dL (ref 0.6–1.1)
EGFR: >90 mL/min/{1.73_m2} (ref >90)
Glucose: 72 mg/dl (ref 70–140)
Potassium: 4.4 mEq/L (ref 3.5–5.1)
Sodium: 144 mEq/L (ref 136–145)
Total Bilirubin: 0.2 mg/dL (ref 0.20–1.20)
Total Protein: 6.1 g/dL — ABNORMAL LOW (ref 6.4–8.3)

## 2014-08-06 LAB — CBC WITH DIFFERENTIAL/PLATELET
BASO%: 0.9 % (ref 0.0–2.0)
BASOS ABS: 0.1 10*3/uL (ref 0.0–0.1)
EOS%: 0.9 % (ref 0.0–7.0)
Eosinophils Absolute: 0.1 10*3/uL (ref 0.0–0.5)
HEMATOCRIT: 32 % — AB (ref 34.8–46.6)
HEMOGLOBIN: 10.4 g/dL — AB (ref 11.6–15.9)
LYMPH%: 5.3 % — ABNORMAL LOW (ref 14.0–49.7)
MCH: 29.3 pg (ref 25.1–34.0)
MCHC: 32.3 g/dL (ref 31.5–36.0)
MCV: 90.6 fL (ref 79.5–101.0)
MONO#: 1.2 10*3/uL — ABNORMAL HIGH (ref 0.1–0.9)
MONO%: 8.6 % (ref 0.0–14.0)
NEUT#: 12.2 10*3/uL — ABNORMAL HIGH (ref 1.5–6.5)
NEUT%: 84.3 % — AB (ref 38.4–76.8)
Platelets: 160 10*3/uL (ref 145–400)
RBC: 3.54 10*6/uL — ABNORMAL LOW (ref 3.70–5.45)
RDW: 18 % — ABNORMAL HIGH (ref 11.2–14.5)
WBC: 14.5 10*3/uL — ABNORMAL HIGH (ref 3.9–10.3)
lymph#: 0.8 10*3/uL — ABNORMAL LOW (ref 0.9–3.3)

## 2014-08-06 NOTE — Progress Notes (Signed)
Patient Care Team: Colon Branch, MD as PCP - General  DIAGNOSIS: Breast cancer of lower-outer quadrant of right female breast   Staging form: Breast, AJCC 7th Edition     Clinical: Stage IIA (T2(m), N0, M0) - Unsigned     Pathologic: Stage IIIA (T3, N2a, cM0) - Signed by Rulon Eisenmenger, MD on 06/12/2014   SUMMARY OF ONCOLOGIC HISTORY:   Breast cancer of lower-outer quadrant of right female breast   01/31/2014 Breast MRI Right breast mass 3.3 x 2.8 x 3.4 cm with several foci of subcentimeter enhancing nodules extending anteriorly to worsen nipple up to 5.4 cm anterior to the mass    04/17/2014 Surgery Bilateral mastectomies, left: Benign, right breast multifocal ILC grade 2 largest 5.5 cm, posterior margin focally positive, 2 SLN's positive with extracapsular extension + 4/10 axillary LN positive, ER 92%, PR 98%, HER-2 -1.1.3, Ki 67 10%   06/12/2014 -  Chemotherapy Adriamycin and Cytoxan dose dense every 2 weeks x4 followed by Taxol x12    CHIEF COMPLIANT: Tomorrow is week 1 of Taxol  INTERVAL HISTORY: Olivia Acosta is a 54 year old lady who had bilateral mastectomies and is now on adjuvant chemotherapy. She completed 4 cycles of dose dense Adriamycin and Cytoxan. Tomorrow she will start the first dose of weekly Taxol treatments. She reports that she had done very well from chemotherapy after found with mild fatigue as her only major side effect. She also had lack of taste. But she is eating well and is staying active. She is working full-time from her work from home.  REVIEW OF SYSTEMS:   Constitutional: Denies fevers, chills or abnormal weight loss Eyes: Denies blurriness of vision Ears, nose, mouth, throat, and face: Denies mucositis or sore throat Respiratory: Denies cough, dyspnea or wheezes Cardiovascular: Denies palpitation, chest discomfort or lower extremity swelling Gastrointestinal:  Occasional heartburn Skin: Denies abnormal skin rashes Lymphatics: Denies new lymphadenopathy or  easy bruising Neurological:Denies numbness, tingling or new weaknesses Behavioral/Psych: Mood is stable, no new changes  Breast:  denies any pain or lumps or nodules in either breasts All other systems were reviewed with the patient and are negative.  I have reviewed the past medical history, past surgical history, social history and family history with the patient and they are unchanged from previous note.  ALLERGIES:  is allergic to penicillins.  MEDICATIONS:  Current Outpatient Prescriptions  Medication Sig Dispense Refill  . ALPRAZolam (XANAX) 0.5 MG tablet Take 0.5 mg by mouth daily as needed for anxiety or sleep.    Marland Kitchen docusate sodium 100 MG CAPS Take 100 mg by mouth daily. 10 capsule 0  . enoxaparin (LOVENOX) 40 MG/0.4ML injection   0  . esomeprazole (NEXIUM) 20 MG capsule Take 1 capsule (20 mg total) by mouth daily at 12 noon. 30 capsule 1  . HYDROcodone-acetaminophen (NORCO/VICODIN) 5-325 MG per tablet Take 1-2 tablets by mouth every 4 (four) hours as needed for moderate pain or severe pain. 30 tablet 0  . HYDROmorphone (DILAUDID) 2 MG tablet   0  . methocarbamol (ROBAXIN) 500 MG tablet Take 1 tablet (500 mg total) by mouth 4 (four) times daily. 40 tablet 1  . promethazine (PHENERGAN) 25 MG tablet   0  . valACYclovir (VALTREX) 500 MG tablet Take 500 mg by mouth daily.     No current facility-administered medications for this visit.    PHYSICAL EXAMINATION: ECOG PERFORMANCE STATUS: 1 - Symptomatic but completely ambulatory  Filed Vitals:   08/06/14 0828  BP: 108/50  Pulse: 72  Temp: 98.2 F (36.8 C)  Resp: 18   Filed Weights   08/06/14 0828  Weight: 157 lb 3.2 oz (71.305 kg)    GENERAL:alert, no distress and comfortable SKIN: skin color, texture, turgor are normal, no rashes or significant lesions EYES: normal, Conjunctiva are pink and non-injected, sclera clear OROPHARYNX:no exudate, no erythema and lips, buccal mucosa, and tongue normal  NECK: supple, thyroid  normal size, non-tender, without nodularity LYMPH:  no palpable lymphadenopathy in the cervical, axillary or inguinal LUNGS: clear to auscultation and percussion with normal breathing effort HEART: regular rate & rhythm and no murmurs and no lower extremity edema ABDOMEN:abdomen soft, non-tender and normal bowel sounds Musculoskeletal:no cyanosis of digits and no clubbing  NEURO: alert & oriented x 3 with fluent speech, no focal motor/sensory deficits   LABORATORY DATA:  I have reviewed the data as listed   Chemistry      Component Value Date/Time   NA 144 08/06/2014 0804   NA 140 04/09/2014 0846   K 4.4 08/06/2014 0804   K 4.1 04/09/2014 0846   CL 103 04/09/2014 0846   CO2 27 08/06/2014 0804   CO2 27 04/09/2014 0846   BUN 12.8 08/06/2014 0804   BUN 21 04/09/2014 0846   CREATININE 0.7 08/06/2014 0804   CREATININE 0.77 04/09/2014 0846      Component Value Date/Time   CALCIUM 9.2 08/06/2014 0804   CALCIUM 9.5 04/09/2014 0846   ALKPHOS 89 08/06/2014 0804   AST 17 08/06/2014 0804   ALT 9 08/06/2014 0804   BILITOT 0.20 08/06/2014 0804       Lab Results  Component Value Date   WBC 14.5* 08/06/2014   HGB 10.4* 08/06/2014   HCT 32.0* 08/06/2014   MCV 90.6 08/06/2014   PLT 160 08/06/2014   NEUTROABS 12.2* 08/06/2014    ASSESSMENT & PLAN:  Breast cancer of lower-outer quadrant of right female breast Right breast invasive lobular cancer 5.5 cm multifocal disease with 6/12 lymph nodes positive T3, N2, M0 stage IIIa, ER 92%, PR 98%, HER-2 negative ratio 1.13, Ki-67 10% status post bilateral mastectomies on 04/17/2014  Current treatment:Adriamycin and Cytoxan dose dense every 2 weeks x4 followed by Taxol x12  Completed 4 cycles of Adriamycin and Cytoxan Tomorrow is week 1/12 Taxol Counseled her extensively regarding Taxol side effects including neuropathy risks. If she does neuropathy we will switch her to Abraxane. We will also be monitoring of blood counts very  closely.  Toxicities of chemotherapy: 1. Grade 3 neutropenia: Chemotherapy dose reduced with cycle 2, Adriamycin 50 mg/m and Cytoxan 500 mg/m 2. Headache related to Zofran: Resolved with discontinuing Zofran 3. Severe heartburn: prescribed Nexium 20 mg daily, Decadron discontinued 4. Small mouth sores 5. Fatigue grade 1  Patient had questions regarding breast reconstruction. She wanted to know if she should get an implant prior to starting radiation since she is very concerned that deflating the expander may make it difficult in the future to reexpanded after radiation.  Monitoring closely for chemotoxicities. Return to clinic in 1 weeks for clinic follow-up.    Orders Placed This Encounter  Procedures  . CBC with Differential    Standing Status: Future     Number of Occurrences:      Standing Expiration Date: 08/06/2015  . Comprehensive metabolic panel (Cmet) - [facility]    Standing Status: Future     Number of Occurrences:      Standing Expiration Date: 08/06/2015   The patient has a good  understanding of the overall plan. she agrees with it. She will call with any problems that may develop before her next visit here.   Rulon Eisenmenger, MD

## 2014-08-06 NOTE — Telephone Encounter (Signed)
Per staff message and POF I have scheduled appts. Advised scheduler of appts. JMW  

## 2014-08-06 NOTE — Telephone Encounter (Signed)
per pof to sch pt appt-gave pt copy opf sch

## 2014-08-06 NOTE — Assessment & Plan Note (Signed)
Right breast invasive lobular cancer 5.5 cm multifocal disease with 6/12 lymph nodes positive T3, N2, M0 stage IIIa, ER 92%, PR 98%, HER-2 negative ratio 1.13, Ki-67 10% status post bilateral mastectomies on 04/17/2014  Current treatment:Adriamycin and Cytoxan dose dense every 2 weeks x4 followed by Taxol x12  Completed 4 cycles of Adriamycin and Cytoxan Today is week 1/12 Taxol  Toxicities of chemotherapy: 1. Grade 3 neutropenia: Chemotherapy dose reduced with cycle 2, Adriamycin 50 mg/m and Cytoxan 500 mg/m 2. Headache related to Zofran: Resolved with discontinuing Zofran 3. Severe heartburn: prescribed Nexium 20 mg daily, Decadron discontinued 4. Small mouth sores  Blood counts are adequate for treatment today. Monitoring closely for chemotoxicities. Return to clinic in 2 weeks for clinic follow-up in weekly for Taxol treatments.

## 2014-08-07 ENCOUNTER — Ambulatory Visit (HOSPITAL_BASED_OUTPATIENT_CLINIC_OR_DEPARTMENT_OTHER): Payer: BLUE CROSS/BLUE SHIELD

## 2014-08-07 ENCOUNTER — Other Ambulatory Visit: Payer: Self-pay | Admitting: Hematology and Oncology

## 2014-08-07 DIAGNOSIS — C50511 Malignant neoplasm of lower-outer quadrant of right female breast: Secondary | ICD-10-CM

## 2014-08-07 DIAGNOSIS — Z5111 Encounter for antineoplastic chemotherapy: Secondary | ICD-10-CM

## 2014-08-07 MED ORDER — DEXAMETHASONE SODIUM PHOSPHATE 10 MG/ML IJ SOLN
INTRAMUSCULAR | Status: AC
  Filled 2014-08-07: qty 1

## 2014-08-07 MED ORDER — DEXAMETHASONE SODIUM PHOSPHATE 10 MG/ML IJ SOLN
10.0000 mg | Freq: Once | INTRAMUSCULAR | Status: AC
  Administered 2014-08-07: 10 mg via INTRAVENOUS

## 2014-08-07 MED ORDER — DIPHENHYDRAMINE HCL 50 MG/ML IJ SOLN
50.0000 mg | Freq: Once | INTRAMUSCULAR | Status: AC
  Administered 2014-08-07: 50 mg via INTRAVENOUS

## 2014-08-07 MED ORDER — ONDANSETRON 8 MG/NS 50 ML IVPB
INTRAVENOUS | Status: AC
  Filled 2014-08-07: qty 8

## 2014-08-07 MED ORDER — ONDANSETRON 8 MG/50ML IVPB (CHCC)
8.0000 mg | Freq: Once | INTRAVENOUS | Status: AC
  Administered 2014-08-07: 8 mg via INTRAVENOUS

## 2014-08-07 MED ORDER — FAMOTIDINE IN NACL 20-0.9 MG/50ML-% IV SOLN
20.0000 mg | Freq: Once | INTRAVENOUS | Status: AC
  Administered 2014-08-07: 20 mg via INTRAVENOUS

## 2014-08-07 MED ORDER — PACLITAXEL CHEMO INJECTION 300 MG/50ML
80.0000 mg/m2 | Freq: Once | INTRAVENOUS | Status: AC
  Administered 2014-08-07: 150 mg via INTRAVENOUS
  Filled 2014-08-07: qty 25

## 2014-08-07 MED ORDER — DIPHENHYDRAMINE HCL 50 MG/ML IJ SOLN
INTRAMUSCULAR | Status: AC
Start: 2014-08-07 — End: 2014-08-07
  Filled 2014-08-07: qty 1

## 2014-08-07 MED ORDER — SODIUM CHLORIDE 0.9 % IV SOLN
Freq: Once | INTRAVENOUS | Status: AC
  Administered 2014-08-07: 09:00:00 via INTRAVENOUS

## 2014-08-07 MED ORDER — HEPARIN SOD (PORK) LOCK FLUSH 100 UNIT/ML IV SOLN
500.0000 [IU] | Freq: Once | INTRAVENOUS | Status: AC | PRN
  Administered 2014-08-07: 500 [IU]
  Filled 2014-08-07: qty 5

## 2014-08-07 MED ORDER — SODIUM CHLORIDE 0.9 % IJ SOLN
10.0000 mL | INTRAMUSCULAR | Status: DC | PRN
  Administered 2014-08-07: 10 mL
  Filled 2014-08-07: qty 10

## 2014-08-07 MED ORDER — FAMOTIDINE IN NACL 20-0.9 MG/50ML-% IV SOLN
INTRAVENOUS | Status: AC
  Filled 2014-08-07: qty 50

## 2014-08-07 NOTE — Patient Instructions (Signed)
[facility] Discharge Instructions for Patients Receiving Chemotherapy  Today you received the following chemotherapy agents Taxol  To help prevent nausea and vomiting after your treatment, we encourage you to take your nausea medication     If you develop nausea and vomiting that is not controlled by your nausea medication, call the clinic.   BELOW ARE SYMPTOMS THAT SHOULD BE REPORTED IMMEDIATELY:  *FEVER GREATER THAN 100.5 F  *CHILLS WITH OR WITHOUT FEVER  NAUSEA AND VOMITING THAT IS NOT CONTROLLED WITH YOUR NAUSEA MEDICATION  *UNUSUAL SHORTNESS OF BREATH  *UNUSUAL BRUISING OR BLEEDING  TENDERNESS IN MOUTH AND THROAT WITH OR WITHOUT PRESENCE OF ULCERS  *URINARY PROBLEMS  *BOWEL PROBLEMS  UNUSUAL RASH Items with * indicate a potential emergency and should be followed up as soon as possible.  Feel free to call the clinic you have any questions or concerns. The clinic phone number is [phone].   Paclitaxel injection What is this medicine? PACLITAXEL (PAK li TAX el) is a chemotherapy drug. It targets fast dividing cells, like cancer cells, and causes these cells to die. This medicine is used to treat ovarian cancer, breast cancer, and other cancers. This medicine may be used for other purposes; ask your health care provider or pharmacist if you have questions. COMMON BRAND NAME(S): Onxol, Taxol What should I tell my health care provider before I take this medicine? They need to know if you have any of these conditions: -blood disorders -irregular heartbeat -infection (especially a virus infection such as chickenpox, cold sores, or herpes) -liver disease -previous or ongoing radiation therapy -an unusual or allergic reaction to paclitaxel, alcohol, polyoxyethylated castor oil, other chemotherapy agents, other medicines, foods, dyes, or preservatives -pregnant or trying to get pregnant -breast-feeding How should I use this medicine? This drug is given  as an infusion into a vein. It is administered in a hospital or clinic by a specially trained health care professional. Talk to your pediatrician regarding the use of this medicine in children. Special care may be needed. Overdosage: If you think you have taken too much of this medicine contact a poison control center or emergency room at once. NOTE: This medicine is only for you. Do not share this medicine with others. What if I miss a dose? It is important not to miss your dose. Call your doctor or health care professional if you are unable to keep an appointment. What may interact with this medicine? Do not take this medicine with any of the following medications: -disulfiram -metronidazole This medicine may also interact with the following medications: -cyclosporine -diazepam -ketoconazole -medicines to increase blood counts like filgrastim, pegfilgrastim, sargramostim -other chemotherapy drugs like cisplatin, doxorubicin, epirubicin, etoposide, teniposide, vincristine -quinidine -testosterone -vaccines -verapamil Talk to your doctor or health care professional before taking any of these medicines: -acetaminophen -aspirin -ibuprofen -ketoprofen -naproxen This list may not describe all possible interactions. Give your health care provider a list of all the medicines, herbs, non-prescription drugs, or dietary supplements you use. Also tell them if you smoke, drink alcohol, or use illegal drugs. Some items may interact with your medicine. What should I watch for while using this medicine? Your condition will be monitored carefully while you are receiving this medicine. You will need important blood work done while you are taking this medicine. This drug may make you feel generally unwell. This is not uncommon, as chemotherapy can affect healthy cells as well as cancer cells. Report any side effects. Continue your course of treatment  even though you feel ill unless your doctor tells you  to stop. In some cases, you may be given additional medicines to help with side effects. Follow all directions for their use. Call your doctor or health care professional for advice if you get a fever, chills or sore throat, or other symptoms of a cold or flu. Do not treat yourself. This drug decreases your body's ability to fight infections. Try to avoid being around people who are sick. This medicine may increase your risk to bruise or bleed. Call your doctor or health care professional if you notice any unusual bleeding. Be careful brushing and flossing your teeth or using a toothpick because you may get an infection or bleed more easily. If you have any dental work done, tell your dentist you are receiving this medicine. Avoid taking products that contain aspirin, acetaminophen, ibuprofen, naproxen, or ketoprofen unless instructed by your doctor. These medicines may hide a fever. Do not become pregnant while taking this medicine. Women should inform their doctor if they wish to become pregnant or think they might be pregnant. There is a potential for serious side effects to an unborn child. Talk to your health care professional or pharmacist for more information. Do not breast-feed an infant while taking this medicine. Men are advised not to father a child while receiving this medicine. What side effects may I notice from receiving this medicine? Side effects that you should report to your doctor or health care professional as soon as possible: -allergic reactions like skin rash, itching or hives, swelling of the face, lips, or tongue -low blood counts - This drug may decrease the number of white blood cells, red blood cells and platelets. You may be at increased risk for infections and bleeding. -signs of infection - fever or chills, cough, sore throat, pain or difficulty passing urine -signs of decreased platelets or bleeding - bruising, pinpoint red spots on the skin, black, tarry stools,  nosebleeds -signs of decreased red blood cells - unusually weak or tired, fainting spells, lightheadedness -breathing problems -chest pain -high or low blood pressure -mouth sores -nausea and vomiting -pain, swelling, redness or irritation at the injection site -pain, tingling, numbness in the hands or feet -slow or irregular heartbeat -swelling of the ankle, feet, hands Side effects that usually do not require medical attention (report to your doctor or health care professional if they continue or are bothersome): -bone pain -complete hair loss including hair on your head, underarms, pubic hair, eyebrows, and eyelashes -changes in the color of fingernails -diarrhea -loosening of the fingernails -loss of appetite -muscle or joint pain -red flush to skin -sweating This list may not describe all possible side effects. Call your doctor for medical advice about side effects. You may report side effects to FDA at 1-800-FDA-1088. Where should I keep my medicine? This drug is given in a hospital or clinic and will not be stored at home. NOTE: This sheet is a summary. It may not cover all possible information. If you have questions about this medicine, talk to your doctor, pharmacist, or health care provider.  2015, [employer] Standard. (2012-07-16 16:41:21)

## 2014-08-08 ENCOUNTER — Telehealth: Payer: Self-pay

## 2014-08-08 NOTE — Telephone Encounter (Signed)
Cheeks are flushed. Able to eat, drinking water. Stated she was going to ramp up the water to 2 more cups before she goes home from work. Has had 2 cups water and a glass of milk. No nausea. No change in taste. No GI problems. Call if any questions any time.

## 2014-08-08 NOTE — Telephone Encounter (Signed)
-----   Message from [facility], [place] sent at 08/07/2014 10:55 AM EST ----- Regarding: "1st time chemotherapy" Patient received Taxol for the 1st time today, per Dr. Lindi Adie.  Tolerated treatment well.

## 2014-08-14 ENCOUNTER — Other Ambulatory Visit (HOSPITAL_BASED_OUTPATIENT_CLINIC_OR_DEPARTMENT_OTHER): Payer: BLUE CROSS/BLUE SHIELD

## 2014-08-14 ENCOUNTER — Telehealth: Payer: Self-pay | Admitting: Hematology and Oncology

## 2014-08-14 ENCOUNTER — Ambulatory Visit (HOSPITAL_BASED_OUTPATIENT_CLINIC_OR_DEPARTMENT_OTHER): Payer: BLUE CROSS/BLUE SHIELD

## 2014-08-14 ENCOUNTER — Ambulatory Visit (HOSPITAL_BASED_OUTPATIENT_CLINIC_OR_DEPARTMENT_OTHER): Payer: BLUE CROSS/BLUE SHIELD | Admitting: Hematology and Oncology

## 2014-08-14 VITALS — BP 109/69 | HR 85 | Temp 98.9°F | Resp 18 | Ht 67.0 in | Wt 155.3 lb

## 2014-08-14 DIAGNOSIS — D6481 Anemia due to antineoplastic chemotherapy: Secondary | ICD-10-CM

## 2014-08-14 DIAGNOSIS — Z5111 Encounter for antineoplastic chemotherapy: Secondary | ICD-10-CM

## 2014-08-14 DIAGNOSIS — K1370 Unspecified lesions of oral mucosa: Secondary | ICD-10-CM

## 2014-08-14 DIAGNOSIS — R12 Heartburn: Secondary | ICD-10-CM

## 2014-08-14 DIAGNOSIS — D709 Neutropenia, unspecified: Secondary | ICD-10-CM

## 2014-08-14 DIAGNOSIS — C50511 Malignant neoplasm of lower-outer quadrant of right female breast: Secondary | ICD-10-CM

## 2014-08-14 DIAGNOSIS — J040 Acute laryngitis: Secondary | ICD-10-CM

## 2014-08-14 DIAGNOSIS — J449 Chronic obstructive pulmonary disease, unspecified: Secondary | ICD-10-CM

## 2014-08-14 DIAGNOSIS — R5383 Other fatigue: Secondary | ICD-10-CM

## 2014-08-14 LAB — CBC WITH DIFFERENTIAL/PLATELET
BASO%: 0.8 % (ref 0.0–2.0)
Basophils Absolute: 0.1 10*3/uL (ref 0.0–0.1)
EOS%: 0.5 % (ref 0.0–7.0)
Eosinophils Absolute: 0 10*3/uL (ref 0.0–0.5)
HEMATOCRIT: 30.6 % — AB (ref 34.8–46.6)
HGB: 10.1 g/dL — ABNORMAL LOW (ref 11.6–15.9)
LYMPH#: 0.3 10*3/uL — AB (ref 0.9–3.3)
LYMPH%: 3.1 % — ABNORMAL LOW (ref 14.0–49.7)
MCH: 29.7 pg (ref 25.1–34.0)
MCHC: 33.1 g/dL (ref 31.5–36.0)
MCV: 89.8 fL (ref 79.5–101.0)
MONO#: 1 10*3/uL — ABNORMAL HIGH (ref 0.1–0.9)
MONO%: 10.3 % (ref 0.0–14.0)
NEUT#: 8.6 10*3/uL — ABNORMAL HIGH (ref 1.5–6.5)
NEUT%: 85.3 % — AB (ref 38.4–76.8)
Platelets: 246 10*3/uL (ref 145–400)
RBC: 3.41 10*6/uL — ABNORMAL LOW (ref 3.70–5.45)
RDW: 18.5 % — ABNORMAL HIGH (ref 11.2–14.5)
WBC: 10.1 10*3/uL (ref 3.9–10.3)

## 2014-08-14 LAB — COMPREHENSIVE METABOLIC PANEL (CC13)
ALK PHOS: 73 U/L (ref 40–150)
ALT: 23 U/L (ref 0–55)
ANION GAP: 10 meq/L (ref 3–11)
AST: 22 U/L (ref 5–34)
Albumin: 3.6 g/dL (ref 3.5–5.0)
BILIRUBIN TOTAL: 0.29 mg/dL (ref 0.20–1.20)
BUN: 15.9 mg/dL (ref 7.0–26.0)
CALCIUM: 9.2 mg/dL (ref 8.4–10.4)
CO2: 28 mEq/L (ref 22–29)
CREATININE: 0.7 mg/dL (ref 0.6–1.1)
Chloride: 103 mEq/L (ref 98–109)
Glucose: 89 mg/dl (ref 70–140)
POTASSIUM: 4.4 meq/L (ref 3.5–5.1)
Sodium: 141 mEq/L (ref 136–145)
TOTAL PROTEIN: 6.5 g/dL (ref 6.4–8.3)

## 2014-08-14 MED ORDER — FAMOTIDINE IN NACL 20-0.9 MG/50ML-% IV SOLN
20.0000 mg | Freq: Once | INTRAVENOUS | Status: AC
  Administered 2014-08-14: 20 mg via INTRAVENOUS

## 2014-08-14 MED ORDER — PACLITAXEL CHEMO INJECTION 300 MG/50ML
80.0000 mg/m2 | Freq: Once | INTRAVENOUS | Status: AC
  Administered 2014-08-14: 150 mg via INTRAVENOUS
  Filled 2014-08-14: qty 25

## 2014-08-14 MED ORDER — HEPARIN SOD (PORK) LOCK FLUSH 100 UNIT/ML IV SOLN
500.0000 [IU] | Freq: Once | INTRAVENOUS | Status: AC | PRN
  Administered 2014-08-14: 500 [IU]
  Filled 2014-08-14: qty 5

## 2014-08-14 MED ORDER — SODIUM CHLORIDE 0.9 % IJ SOLN
10.0000 mL | INTRAMUSCULAR | Status: DC | PRN
  Administered 2014-08-14: 10 mL
  Filled 2014-08-14: qty 10

## 2014-08-14 MED ORDER — SODIUM CHLORIDE 0.9 % IV SOLN
Freq: Once | INTRAVENOUS | Status: AC
  Administered 2014-08-14: 10:00:00 via INTRAVENOUS

## 2014-08-14 MED ORDER — AZITHROMYCIN 250 MG PO TABS: ORAL_TABLET | ORAL | Status: DC

## 2014-08-14 MED ORDER — DIPHENHYDRAMINE HCL 50 MG/ML IJ SOLN
INTRAMUSCULAR | Status: AC
  Filled 2014-08-14: qty 1

## 2014-08-14 MED ORDER — DEXAMETHASONE SODIUM PHOSPHATE 100 MG/10ML IJ SOLN
Freq: Once | INTRAMUSCULAR | Status: AC
  Administered 2014-08-14: 10:00:00 via INTRAVENOUS
  Filled 2014-08-14: qty 4

## 2014-08-14 MED ORDER — DIPHENHYDRAMINE HCL 50 MG/ML IJ SOLN
50.0000 mg | Freq: Once | INTRAMUSCULAR | Status: AC
  Administered 2014-08-14: 50 mg via INTRAVENOUS

## 2014-08-14 MED ORDER — FAMOTIDINE IN NACL 20-0.9 MG/50ML-% IV SOLN
INTRAVENOUS | Status: AC
  Filled 2014-08-14: qty 50

## 2014-08-14 NOTE — Patient Instructions (Signed)
[facility] Discharge Instructions for Patients Receiving Chemotherapy  Today you received the following chemotherapy agents taxol  To help prevent nausea and vomiting after your treatment, we encourage you to take your nausea medication as directed   If you develop nausea and vomiting that is not controlled by your nausea medication, call the clinic.   BELOW ARE SYMPTOMS THAT SHOULD BE REPORTED IMMEDIATELY:  *FEVER GREATER THAN 100.5 F  *CHILLS WITH OR WITHOUT FEVER  NAUSEA AND VOMITING THAT IS NOT CONTROLLED WITH YOUR NAUSEA MEDICATION  *UNUSUAL SHORTNESS OF BREATH  *UNUSUAL BRUISING OR BLEEDING  TENDERNESS IN MOUTH AND THROAT WITH OR WITHOUT PRESENCE OF ULCERS  *URINARY PROBLEMS  *BOWEL PROBLEMS  UNUSUAL RASH Items with * indicate a potential emergency and should be followed up as soon as possible.  Feel free to call the clinic you have any questions or concerns. The clinic phone number is [phone].  

## 2014-08-14 NOTE — Progress Notes (Signed)
Patient Care Team: Colon Branch, MD as PCP - General  DIAGNOSIS: Breast cancer of lower-outer quadrant of right female breast   Staging form: Breast, AJCC 7th Edition     Clinical: Stage IIA (T2(m), N0, M0) - Unsigned     Pathologic: Stage IIIA (T3, N2a, cM0) - Signed by Rulon Eisenmenger, MD on 06/12/2014   SUMMARY OF ONCOLOGIC HISTORY:   Breast cancer of lower-outer quadrant of right female breast   01/31/2014 Breast MRI Right breast mass 3.3 x 2.8 x 3.4 cm with several foci of subcentimeter enhancing nodules extending anteriorly to worsen nipple up to 5.4 cm anterior to the mass    04/17/2014 Surgery Bilateral mastectomies, left: Benign, right breast multifocal ILC grade 2 largest 5.5 cm, posterior margin focally positive, 2 SLN's positive with extracapsular extension + 4/10 axillary LN positive, ER 92%, PR 98%, HER-2 -1.1.3, Ki 67 10%   06/12/2014 -  Chemotherapy Adriamycin and Cytoxan dose dense every 2 weeks x4 followed by Taxol x12    CHIEF COMPLIANT: Taxol week 2/12  INTERVAL HISTORY: Olivia Acosta is a 54 year old lady with above-mentioned history of right-sided breast cancer underwent bilateral mastectomies and is now on adjuvant chemotherapy. Today is week 2 of Taxol. She tolerated Taxol fairly well without any major problems. She has good energy levels. She felt mild tingling for a day and then it went away from the fingers. She is still very active and had walked 7 months recently. Continues to work full-time. She works from home.  REVIEW OF SYSTEMS:   Constitutional: Denies fevers, chills or abnormal weight loss Eyes: Denies blurriness of vision Ears, nose, mouth, throat, and face: Denies mucositis or sore throat Respiratory: Denies cough, dyspnea or wheezes Cardiovascular: Denies palpitation, chest discomfort or lower extremity swelling Gastrointestinal:  Denies nausea, heartburn or change in bowel habits Skin: Denies abnormal skin rashes Lymphatics: Denies new lymphadenopathy or  easy bruising Neurological:Denies numbness, tingling or new weaknesses Behavioral/Psych: Mood is stable, no new changes  All other systems were reviewed with the patient and are negative.  I have reviewed the past medical history, past surgical history, social history and family history with the patient and they are unchanged from previous note.  ALLERGIES:  is allergic to penicillins.  MEDICATIONS:  Current Outpatient Prescriptions  Medication Sig Dispense Refill  . ALPRAZolam (XANAX) 0.5 MG tablet Take 0.5 mg by mouth daily as needed for anxiety or sleep.    Marland Kitchen azithromycin (ZITHROMAX Z-PAK) 250 MG tablet Use as directed 6 each 0  . dexamethasone (DECADRON) 4 MG tablet   1  . docusate sodium 100 MG CAPS Take 100 mg by mouth daily. 10 capsule 0  . enoxaparin (LOVENOX) 40 MG/0.4ML injection   0  . esomeprazole (NEXIUM) 20 MG capsule Take 1 capsule (20 mg total) by mouth daily at 12 noon. 30 capsule 1  . HYDROcodone-acetaminophen (NORCO/VICODIN) 5-325 MG per tablet Take 1-2 tablets by mouth every 4 (four) hours as needed for moderate pain or severe pain. 30 tablet 0  . HYDROmorphone (DILAUDID) 2 MG tablet   0  . lidocaine-prilocaine (EMLA) cream   3  . LORazepam (ATIVAN) 0.5 MG tablet   0  . methocarbamol (ROBAXIN) 500 MG tablet Take 1 tablet (500 mg total) by mouth 4 (four) times daily. 40 tablet 1  . ondansetron (ZOFRAN) 8 MG tablet   1  . prochlorperazine (COMPAZINE) 10 MG tablet   1  . promethazine (PHENERGAN) 25 MG tablet   0  .  valACYclovir (VALTREX) 500 MG tablet Take 500 mg by mouth daily.     No current facility-administered medications for this visit.    PHYSICAL EXAMINATION: ECOG PERFORMANCE STATUS: 1 - Symptomatic but completely ambulatory  Filed Vitals:   08/14/14 0844  BP: 109/69  Pulse: 85  Temp: 98.9 F (37.2 C)  Resp: 18   Filed Weights   08/14/14 0844  Weight: 155 lb 4.8 oz (70.444 kg)    GENERAL:alert, no distress and comfortable SKIN: skin color,  texture, turgor are normal, no rashes or significant lesions EYES: normal, Conjunctiva are pink and non-injected, sclera clear OROPHARYNX:no exudate, no erythema and lips, buccal mucosa, and tongue normal  NECK: supple, thyroid normal size, non-tender, without nodularity LYMPH:  no palpable lymphadenopathy in the cervical, axillary or inguinal LUNGS: clear to auscultation and percussion with normal breathing effort HEART: regular rate & rhythm and no murmurs and no lower extremity edema ABDOMEN:abdomen soft, non-tender and normal bowel sounds Musculoskeletal:no cyanosis of digits and no clubbing  NEURO: alert & oriented x 3 with fluent speech, no focal motor/sensory deficits   LABORATORY DATA:  I have reviewed the data as listed   Chemistry      Component Value Date/Time   NA 141 08/14/2014 0834   NA 140 04/09/2014 0846   K 4.4 08/14/2014 0834   K 4.1 04/09/2014 0846   CL 103 04/09/2014 0846   CO2 28 08/14/2014 0834   CO2 27 04/09/2014 0846   BUN 15.9 08/14/2014 0834   BUN 21 04/09/2014 0846   CREATININE 0.7 08/14/2014 0834   CREATININE 0.77 04/09/2014 0846      Component Value Date/Time   CALCIUM 9.2 08/14/2014 0834   CALCIUM 9.5 04/09/2014 0846   ALKPHOS 73 08/14/2014 0834   AST 22 08/14/2014 0834   ALT 23 08/14/2014 0834   BILITOT 0.29 08/14/2014 0834       Lab Results  Component Value Date   WBC 10.1 08/14/2014   HGB 10.1* 08/14/2014   HCT 30.6* 08/14/2014   MCV 89.8 08/14/2014   PLT 246 08/14/2014   NEUTROABS 8.6* 08/14/2014   ASSESSMENT & PLAN:  Breast cancer of lower-outer quadrant of right female breast Right breast invasive lobular cancer 5.5 cm multifocal disease with 6/12 lymph nodes positive T3, N2, M0 stage IIIa, ER 92%, PR 98%, HER-2 negative ratio 1.13, Ki-67 10% status post bilateral mastectomies on 04/17/2014  Treatment plan:Adriamycin and Cytoxan dose dense every 2 weeks x4 followed by Taxol x12  Completed 4 cycles of Adriamycin and  Cytoxan Current treatment: Week 2/12 Taxol Counseled her extensively regarding Taxol side effects including neuropathy risks. If she does neuropathy we will switch her to Abraxane. We will also be monitoring of blood counts very closely.  Toxicities of chemotherapy: 1. Grade 3 neutropenia: Chemotherapy dose reduced with cycle 2, Adriamycin 50 mg/m and Cytoxan 500 mg/m 2. Headache related to Zofran: Resolved with discontinuing Zofran 3. Severe heartburn: prescribed Nexium 20 mg daily, Decadron discontinued 4. Small mouth sores 5. Fatigue grade 1 6. Bronchitis/laryngitis 08/14/2014: Prescribed azithromycin 7. Chemotherapy-induced anemia hemoglobin 10.1 being monitored  Return to clinic in 2 weeks for clinic follow-up in weekly for chemotherapy  No orders of the defined types were placed in this encounter.   The patient has a good understanding of the overall plan. she agrees with it. She will call with any problems that may develop before her next visit here.   Rulon Eisenmenger, MD

## 2014-08-14 NOTE — Telephone Encounter (Signed)
appts made and pt will get avs in chemo today,went ahead and added q 2week appts    anne

## 2014-08-14 NOTE — Assessment & Plan Note (Signed)
Right breast invasive lobular cancer 5.5 cm multifocal disease with 6/12 lymph nodes positive T3, N2, M0 stage IIIa, ER 92%, PR 98%, HER-2 negative ratio 1.13, Ki-67 10% status post bilateral mastectomies on 04/17/2014  Treatment plan:Adriamycin and Cytoxan dose dense every 2 weeks x4 followed by Taxol x12  Completed 4 cycles of Adriamycin and Cytoxan Current treatment: Week 2/12 Taxol Counseled her extensively regarding Taxol side effects including neuropathy risks. If she does neuropathy we will switch her to Abraxane. We will also be monitoring of blood counts very closely.  Toxicities of chemotherapy: 1. Grade 3 neutropenia: Chemotherapy dose reduced with cycle 2, Adriamycin 50 mg/m and Cytoxan 500 mg/m 2. Headache related to Zofran: Resolved with discontinuing Zofran 3. Severe heartburn: prescribed Nexium 20 mg daily, Decadron discontinued 4. Small mouth sores 5. Fatigue grade 1  Return to clinic in 2 weeks for clinic follow-up in weekly for chemotherapy

## 2014-08-21 ENCOUNTER — Other Ambulatory Visit (HOSPITAL_BASED_OUTPATIENT_CLINIC_OR_DEPARTMENT_OTHER): Payer: BLUE CROSS/BLUE SHIELD

## 2014-08-21 ENCOUNTER — Ambulatory Visit (HOSPITAL_BASED_OUTPATIENT_CLINIC_OR_DEPARTMENT_OTHER): Payer: BLUE CROSS/BLUE SHIELD | Admitting: Hematology and Oncology

## 2014-08-21 ENCOUNTER — Ambulatory Visit (HOSPITAL_BASED_OUTPATIENT_CLINIC_OR_DEPARTMENT_OTHER): Payer: BLUE CROSS/BLUE SHIELD

## 2014-08-21 VITALS — BP 103/72 | HR 74 | Temp 98.0°F | Resp 18 | Ht 67.0 in | Wt 154.1 lb

## 2014-08-21 DIAGNOSIS — C50911 Malignant neoplasm of unspecified site of right female breast: Secondary | ICD-10-CM

## 2014-08-21 DIAGNOSIS — Z170 Estrogen receptor positive status [ER+]: Secondary | ICD-10-CM

## 2014-08-21 DIAGNOSIS — D6481 Anemia due to antineoplastic chemotherapy: Secondary | ICD-10-CM

## 2014-08-21 DIAGNOSIS — C773 Secondary and unspecified malignant neoplasm of axilla and upper limb lymph nodes: Secondary | ICD-10-CM

## 2014-08-21 DIAGNOSIS — C50511 Malignant neoplasm of lower-outer quadrant of right female breast: Secondary | ICD-10-CM

## 2014-08-21 DIAGNOSIS — D701 Agranulocytosis secondary to cancer chemotherapy: Secondary | ICD-10-CM

## 2014-08-21 DIAGNOSIS — Z5111 Encounter for antineoplastic chemotherapy: Secondary | ICD-10-CM

## 2014-08-21 LAB — CBC WITH DIFFERENTIAL/PLATELET
BASO%: 1.9 % (ref 0.0–2.0)
BASOS ABS: 0.1 10*3/uL (ref 0.0–0.1)
EOS%: 6.8 % (ref 0.0–7.0)
Eosinophils Absolute: 0.3 10*3/uL (ref 0.0–0.5)
HCT: 31.4 % — ABNORMAL LOW (ref 34.8–46.6)
HEMOGLOBIN: 10.4 g/dL — AB (ref 11.6–15.9)
LYMPH#: 0.5 10*3/uL — AB (ref 0.9–3.3)
LYMPH%: 13.9 % — ABNORMAL LOW (ref 14.0–49.7)
MCH: 30 pg (ref 25.1–34.0)
MCHC: 33.1 g/dL (ref 31.5–36.0)
MCV: 90.5 fL (ref 79.5–101.0)
MONO#: 0.5 10*3/uL (ref 0.1–0.9)
MONO%: 13.1 % (ref 0.0–14.0)
NEUT%: 64.3 % (ref 38.4–76.8)
NEUTROS ABS: 2.4 10*3/uL (ref 1.5–6.5)
PLATELETS: 256 10*3/uL (ref 145–400)
RBC: 3.47 10*6/uL — AB (ref 3.70–5.45)
RDW: 17.5 % — AB (ref 11.2–14.5)
WBC: 3.7 10*3/uL — AB (ref 3.9–10.3)

## 2014-08-21 LAB — COMPREHENSIVE METABOLIC PANEL (CC13)
ALT: 40 U/L (ref 0–55)
AST: 33 U/L (ref 5–34)
Albumin: 3.8 g/dL (ref 3.5–5.0)
Alkaline Phosphatase: 71 U/L (ref 40–150)
Anion Gap: 10 mEq/L (ref 3–11)
BILIRUBIN TOTAL: 0.26 mg/dL (ref 0.20–1.20)
BUN: 20.3 mg/dL (ref 7.0–26.0)
CHLORIDE: 106 meq/L (ref 98–109)
CO2: 26 mEq/L (ref 22–29)
CREATININE: 0.8 mg/dL (ref 0.6–1.1)
Calcium: 9.3 mg/dL (ref 8.4–10.4)
EGFR: 88 mL/min/{1.73_m2} — ABNORMAL LOW (ref >90)
Glucose: 91 mg/dl (ref 70–140)
Potassium: 4.4 mEq/L (ref 3.5–5.1)
Sodium: 142 mEq/L (ref 136–145)
Total Protein: 6.8 g/dL (ref 6.4–8.3)

## 2014-08-21 MED ORDER — HEPARIN SOD (PORK) LOCK FLUSH 100 UNIT/ML IV SOLN
500.0000 [IU] | Freq: Once | INTRAVENOUS | Status: AC | PRN
  Administered 2014-08-21: 500 [IU]
  Filled 2014-08-21: qty 5

## 2014-08-21 MED ORDER — DIPHENHYDRAMINE HCL 50 MG/ML IJ SOLN
50.0000 mg | Freq: Once | INTRAMUSCULAR | Status: AC
  Administered 2014-08-21: 50 mg via INTRAVENOUS

## 2014-08-21 MED ORDER — SODIUM CHLORIDE 0.9 % IJ SOLN
10.0000 mL | INTRAMUSCULAR | Status: DC | PRN
  Administered 2014-08-21: 10 mL
  Filled 2014-08-21: qty 10

## 2014-08-21 MED ORDER — DIPHENHYDRAMINE HCL 50 MG/ML IJ SOLN
INTRAMUSCULAR | Status: AC
  Filled 2014-08-21: qty 1

## 2014-08-21 MED ORDER — FAMOTIDINE IN NACL 20-0.9 MG/50ML-% IV SOLN
20.0000 mg | Freq: Once | INTRAVENOUS | Status: AC
  Administered 2014-08-21: 20 mg via INTRAVENOUS

## 2014-08-21 MED ORDER — SODIUM CHLORIDE 0.9 % IV SOLN
Freq: Once | INTRAVENOUS | Status: AC
  Administered 2014-08-21: 16:00:00 via INTRAVENOUS

## 2014-08-21 MED ORDER — SODIUM CHLORIDE 0.9 % IV SOLN
Freq: Once | INTRAVENOUS | Status: AC
  Administered 2014-08-21: 16:00:00 via INTRAVENOUS
  Filled 2014-08-21: qty 4

## 2014-08-21 MED ORDER — DEXTROSE 5 % IV SOLN
80.0000 mg/m2 | Freq: Once | INTRAVENOUS | Status: AC
  Administered 2014-08-21: 150 mg via INTRAVENOUS
  Filled 2014-08-21: qty 25

## 2014-08-21 MED ORDER — FAMOTIDINE IN NACL 20-0.9 MG/50ML-% IV SOLN
INTRAVENOUS | Status: AC
  Filled 2014-08-21: qty 50

## 2014-08-21 NOTE — Assessment & Plan Note (Signed)
Right breast invasive lobular cancer 5.5 cm multifocal disease with 6/12 lymph nodes positive T3, N2, M0 stage IIIa, ER 92%, PR 98%, HER-2 negative ratio 1.13, Ki-67 10% status post bilateral mastectomies on 04/17/2014  Treatment plan:Adriamycin and Cytoxan dose dense every 2 weeks x4 followed by Taxol x12  Completed 4 cycles of Adriamycin and Cytoxan Current treatment: Week 3/12 Taxol Counseled her extensively regarding Taxol side effects including neuropathy risks. If she does neuropathy we will switch her to Abraxane. We will also be monitoring of blood counts very closely.  Toxicities of chemotherapy: 1. Grade 3 neutropenia: Chemotherapy dose reduced with cycle 2, Adriamycin 50 mg/m and Cytoxan 500 mg/m 2. Headache related to Zofran: Resolved with discontinuing Zofran 3. Severe heartburn: prescribed Nexium 20 mg daily, Decadron discontinued 4. Small mouth sores 5. Fatigue grade 1 6. Bronchitis/laryngitis 08/14/2014: Prescribed azithromycin 7. Chemotherapy-induced anemia hemoglobin 10.1 being monitored  Return to clinic in 2 weeks for clinic follow-up in weekly for chemotherapy

## 2014-08-21 NOTE — Progress Notes (Signed)
Patient Care Team: Colon Branch, MD as PCP - General  DIAGNOSIS: Breast cancer of lower-outer quadrant of right female breast   Staging form: Breast, AJCC 7th Edition     Clinical: Stage IIA (T2(m), N0, M0) - Unsigned     Pathologic: Stage IIIA (T3, N2a, cM0) - Signed by Rulon Eisenmenger, MD on 06/12/2014   SUMMARY OF ONCOLOGIC HISTORY:   Breast cancer of lower-outer quadrant of right female breast   01/31/2014 Breast MRI Right breast mass 3.3 x 2.8 x 3.4 cm with several foci of subcentimeter enhancing nodules extending anteriorly to worsen nipple up to 5.4 cm anterior to the mass    04/17/2014 Surgery Bilateral mastectomies, left: Benign, right breast multifocal ILC grade 2 largest 5.5 cm, posterior margin focally positive, 2 SLN's positive with extracapsular extension + 4/10 axillary LN positive, ER 92%, PR 98%, HER-2 -1.1.3, Ki 67 10%   06/12/2014 -  Chemotherapy Adriamycin and Cytoxan dose dense every 2 weeks x4 followed by Taxol x12    CHIEF COMPLIANT: Week 3 Taxol: Marked improvement in sinus congestion with azithromycin  INTERVAL HISTORY: Olivia Acosta is a 54 year old lady with above-mentioned history of right-sided breast cancer who is currently on adjuvant chemotherapy. She is currently on weekly Taxol. She had sinus congestion and cough and we prescribed her azithromycin. This has helped resolve her symptoms. She is feeling a lot better today. Denies any fevers or chills. She has even walked 5K recently and felt good. She has plans to walk the bridge run in [facility].  REVIEW OF SYSTEMS:   Constitutional: Denies fevers, chills or abnormal weight loss Eyes: Denies blurriness of vision Ears, nose, mouth, throat, and face: Denies mucositis or sore throat Respiratory: Denies cough, dyspnea or wheezes Cardiovascular: Denies palpitation, chest discomfort or lower extremity swelling Gastrointestinal:  Denies nausea, heartburn or change in bowel habits Skin: Denies abnormal  skin rashes Lymphatics: Denies new lymphadenopathy or easy bruising Neurological:Denies numbness, tingling or new weaknesses Behavioral/Psych: Mood is stable, no new changes  Breast:  denies any pain or lumps or nodules in either breasts All other systems were reviewed with the patient and are negative.  I have reviewed the past medical history, past surgical history, social history and family history with the patient and they are unchanged from previous note.  ALLERGIES:  is allergic to penicillins.  MEDICATIONS:  Current Outpatient Prescriptions  Medication Sig Dispense Refill  . dexamethasone (DECADRON) 4 MG tablet   1  . esomeprazole (NEXIUM) 20 MG capsule Take 1 capsule (20 mg total) by mouth daily at 12 noon. 30 capsule 1  . lidocaine-prilocaine (EMLA) cream   3  . LORazepam (ATIVAN) 0.5 MG tablet   0  . ondansetron (ZOFRAN) 8 MG tablet   1  . prochlorperazine (COMPAZINE) 10 MG tablet   1  . valACYclovir (VALTREX) 500 MG tablet Take 500 mg by mouth daily.     No current facility-administered medications for this visit.    PHYSICAL EXAMINATION: ECOG PERFORMANCE STATUS: 1 - Symptomatic but completely ambulatory  Filed Vitals:   08/21/14 1434  BP: 103/72  Pulse: 74  Temp: 98 F (36.7 C)  Resp: 18   Filed Weights   08/21/14 1434  Weight: 154 lb 1.6 oz (69.899 kg)    GENERAL:alert, no distress and comfortable SKIN: skin color, texture, turgor are normal, no rashes or significant lesions EYES: normal, Conjunctiva are pink and non-injected, sclera clear OROPHARYNX:no exudate, no erythema and lips, buccal mucosa, and tongue  normal  NECK: supple, thyroid normal size, non-tender, without nodularity LYMPH:  no palpable lymphadenopathy in the cervical, axillary or inguinal LUNGS: clear to auscultation and percussion with normal breathing effort HEART: regular rate & rhythm and no murmurs and no lower extremity edema ABDOMEN:abdomen soft, non-tender and normal bowel  sounds Musculoskeletal:no cyanosis of digits and no clubbing  NEURO: alert & oriented x 3 with fluent speech, no focal motor/sensory deficits   LABORATORY DATA:  I have reviewed the data as listed   Chemistry      Component Value Date/Time   NA 141 08/14/2014 0834   NA 140 04/09/2014 0846   K 4.4 08/14/2014 0834   K 4.1 04/09/2014 0846   CL 103 04/09/2014 0846   CO2 28 08/14/2014 0834   CO2 27 04/09/2014 0846   BUN 15.9 08/14/2014 0834   BUN 21 04/09/2014 0846   CREATININE 0.7 08/14/2014 0834   CREATININE 0.77 04/09/2014 0846      Component Value Date/Time   CALCIUM 9.2 08/14/2014 0834   CALCIUM 9.5 04/09/2014 0846   ALKPHOS 73 08/14/2014 0834   AST 22 08/14/2014 0834   ALT 23 08/14/2014 0834   BILITOT 0.29 08/14/2014 0834       Lab Results  Component Value Date   WBC 3.7* 08/21/2014   HGB 10.4* 08/21/2014   HCT 31.4* 08/21/2014   MCV 90.5 08/21/2014   PLT 256 08/21/2014   NEUTROABS 2.4 08/21/2014   ASSESSMENT & PLAN:  Breast cancer of lower-outer quadrant of right female breast Right breast invasive lobular cancer 5.5 cm multifocal disease with 6/12 lymph nodes positive T3, N2, M0 stage IIIa, ER 92%, PR 98%, HER-2 negative ratio 1.13, Ki-67 10% status post bilateral mastectomies on 04/17/2014  Treatment plan:Adriamycin and Cytoxan dose dense every 2 weeks x4 followed by Taxol x12  Completed 4 cycles of Adriamycin and Cytoxan Current treatment: Week 3/12 Taxol Counseled her extensively regarding Taxol side effects including neuropathy risks. If she does neuropathy we will switch her to Abraxane. We will also be monitoring of blood counts very closely.  Toxicities of chemotherapy: 1. Grade 3 neutropenia: Chemotherapy dose reduced with cycle 2, Adriamycin 50 mg/m and Cytoxan 500 mg/m 2. Headache related to Zofran: Resolved with discontinuing Zofran 3. Severe heartburn: prescribed Nexium 20 mg daily, Decadron discontinued 4. Small mouth sores 5. Fatigue grade  1 6. Bronchitis/laryngitis 08/14/2014: Resolved with azithromycin 7. Chemotherapy-induced anemia hemoglobin 10.5 being monitored  Return to clinic in 2 weeks for clinic follow-up in weekly for chemotherapy  No orders of the defined types were placed in this encounter.   The patient has a good understanding of the overall plan. she agrees with it. She will call with any problems that may develop before her next visit here.   Rulon Eisenmenger, MD

## 2014-08-21 NOTE — Patient Instructions (Signed)
[facility] Discharge Instructions for Patients Receiving Chemotherapy  Today you received the following chemotherapy agents:  Taxol  To help prevent nausea and vomiting after your treatment, we encourage you to take your nausea medication as prescribed.   If you develop nausea and vomiting that is not controlled by your nausea medication, call the clinic.   BELOW ARE SYMPTOMS THAT SHOULD BE REPORTED IMMEDIATELY:  *FEVER GREATER THAN 100.5 F  *CHILLS WITH OR WITHOUT FEVER  NAUSEA AND VOMITING THAT IS NOT CONTROLLED WITH YOUR NAUSEA MEDICATION  *UNUSUAL SHORTNESS OF BREATH  *UNUSUAL BRUISING OR BLEEDING  TENDERNESS IN MOUTH AND THROAT WITH OR WITHOUT PRESENCE OF ULCERS  *URINARY PROBLEMS  *BOWEL PROBLEMS  UNUSUAL RASH Items with * indicate a potential emergency and should be followed up as soon as possible.  Feel free to call the clinic you have any questions or concerns. The clinic phone number is [phone].  Please show the CHEMO ALERT CARD at check-in to the Emergency Department and triage nurse.   

## 2014-08-22 ENCOUNTER — Telehealth: Payer: Self-pay | Admitting: Hematology and Oncology

## 2014-08-22 NOTE — Telephone Encounter (Signed)
Spoke with patient and she is aware of her new appointments/split for 3/30 and 3/31    anne

## 2014-08-27 ENCOUNTER — Other Ambulatory Visit: Payer: BLUE CROSS/BLUE SHIELD

## 2014-08-27 ENCOUNTER — Other Ambulatory Visit: Payer: Self-pay | Admitting: *Deleted

## 2014-08-27 ENCOUNTER — Ambulatory Visit: Payer: BLUE CROSS/BLUE SHIELD | Admitting: Hematology and Oncology

## 2014-08-27 DIAGNOSIS — C50511 Malignant neoplasm of lower-outer quadrant of right female breast: Secondary | ICD-10-CM

## 2014-08-28 ENCOUNTER — Ambulatory Visit (HOSPITAL_BASED_OUTPATIENT_CLINIC_OR_DEPARTMENT_OTHER): Payer: BLUE CROSS/BLUE SHIELD

## 2014-08-28 ENCOUNTER — Other Ambulatory Visit (HOSPITAL_BASED_OUTPATIENT_CLINIC_OR_DEPARTMENT_OTHER): Payer: BLUE CROSS/BLUE SHIELD

## 2014-08-28 ENCOUNTER — Telehealth: Payer: Self-pay | Admitting: Hematology and Oncology

## 2014-08-28 ENCOUNTER — Ambulatory Visit (HOSPITAL_BASED_OUTPATIENT_CLINIC_OR_DEPARTMENT_OTHER): Payer: BLUE CROSS/BLUE SHIELD | Admitting: Hematology and Oncology

## 2014-08-28 VITALS — BP 113/65 | HR 72 | Temp 98.2°F | Resp 18 | Ht 67.0 in | Wt 157.2 lb

## 2014-08-28 DIAGNOSIS — D701 Agranulocytosis secondary to cancer chemotherapy: Secondary | ICD-10-CM | POA: Diagnosis not present

## 2014-08-28 DIAGNOSIS — R5383 Other fatigue: Secondary | ICD-10-CM

## 2014-08-28 DIAGNOSIS — C773 Secondary and unspecified malignant neoplasm of axilla and upper limb lymph nodes: Secondary | ICD-10-CM

## 2014-08-28 DIAGNOSIS — C50511 Malignant neoplasm of lower-outer quadrant of right female breast: Secondary | ICD-10-CM

## 2014-08-28 DIAGNOSIS — Z5111 Encounter for antineoplastic chemotherapy: Secondary | ICD-10-CM

## 2014-08-28 DIAGNOSIS — D6481 Anemia due to antineoplastic chemotherapy: Secondary | ICD-10-CM | POA: Diagnosis not present

## 2014-08-28 DIAGNOSIS — R12 Heartburn: Secondary | ICD-10-CM

## 2014-08-28 DIAGNOSIS — K1230 Oral mucositis (ulcerative), unspecified: Secondary | ICD-10-CM

## 2014-08-28 LAB — CBC WITH DIFFERENTIAL/PLATELET
BASO%: 3.4 % — AB (ref 0.0–2.0)
Basophils Absolute: 0.1 10*3/uL (ref 0.0–0.1)
EOS%: 7.3 % — ABNORMAL HIGH (ref 0.0–7.0)
Eosinophils Absolute: 0.2 10*3/uL (ref 0.0–0.5)
HEMATOCRIT: 32.2 % — AB (ref 34.8–46.6)
HEMOGLOBIN: 10.6 g/dL — AB (ref 11.6–15.9)
LYMPH#: 0.4 10*3/uL — AB (ref 0.9–3.3)
LYMPH%: 14 % (ref 14.0–49.7)
MCH: 29.9 pg (ref 25.1–34.0)
MCHC: 32.9 g/dL (ref 31.5–36.0)
MCV: 90.8 fL (ref 79.5–101.0)
MONO#: 0.3 10*3/uL (ref 0.1–0.9)
MONO%: 9.8 % (ref 0.0–14.0)
NEUT#: 2.1 10*3/uL (ref 1.5–6.5)
NEUT%: 65.5 % (ref 38.4–76.8)
Platelets: 246 10*3/uL (ref 145–400)
RBC: 3.55 10*6/uL — ABNORMAL LOW (ref 3.70–5.45)
RDW: 19.7 % — ABNORMAL HIGH (ref 11.2–14.5)
WBC: 3.2 10*3/uL — AB (ref 3.9–10.3)

## 2014-08-28 LAB — COMPREHENSIVE METABOLIC PANEL (CC13)
ALT: 32 U/L (ref 0–55)
AST: 26 U/L (ref 5–34)
Albumin: 3.7 g/dL (ref 3.5–5.0)
Alkaline Phosphatase: 80 U/L (ref 40–150)
Anion Gap: 9 mEq/L (ref 3–11)
BILIRUBIN TOTAL: 0.29 mg/dL (ref 0.20–1.20)
BUN: 14.3 mg/dL (ref 7.0–26.0)
CALCIUM: 9.2 mg/dL (ref 8.4–10.4)
CHLORIDE: 108 meq/L (ref 98–109)
CO2: 25 meq/L (ref 22–29)
CREATININE: 0.7 mg/dL (ref 0.6–1.1)
EGFR: >90 mL/min/{1.73_m2} (ref >90)
Glucose: 99 mg/dl (ref 70–140)
Potassium: 4.2 mEq/L (ref 3.5–5.1)
SODIUM: 142 meq/L (ref 136–145)
Total Protein: 6.5 g/dL (ref 6.4–8.3)

## 2014-08-28 MED ORDER — HEPARIN SOD (PORK) LOCK FLUSH 100 UNIT/ML IV SOLN
500.0000 [IU] | Freq: Once | INTRAVENOUS | Status: AC | PRN
  Administered 2014-08-28: 500 [IU]
  Filled 2014-08-28: qty 5

## 2014-08-28 MED ORDER — SODIUM CHLORIDE 0.9 % IJ SOLN
10.0000 mL | INTRAMUSCULAR | Status: DC | PRN
  Administered 2014-08-28: 10 mL
  Filled 2014-08-28: qty 10

## 2014-08-28 MED ORDER — DIPHENHYDRAMINE HCL 50 MG/ML IJ SOLN
INTRAMUSCULAR | Status: AC
  Filled 2014-08-28: qty 1

## 2014-08-28 MED ORDER — SODIUM CHLORIDE 0.9 % IV SOLN
Freq: Once | INTRAVENOUS | Status: AC
  Administered 2014-08-28: 10:00:00 via INTRAVENOUS
  Filled 2014-08-28: qty 4

## 2014-08-28 MED ORDER — DIPHENHYDRAMINE HCL 50 MG/ML IJ SOLN
50.0000 mg | Freq: Once | INTRAMUSCULAR | Status: AC
  Administered 2014-08-28: 50 mg via INTRAVENOUS

## 2014-08-28 MED ORDER — SODIUM CHLORIDE 0.9 % IV SOLN
Freq: Once | INTRAVENOUS | Status: AC
  Administered 2014-08-28: 10:00:00 via INTRAVENOUS

## 2014-08-28 MED ORDER — FAMOTIDINE IN NACL 20-0.9 MG/50ML-% IV SOLN
20.0000 mg | Freq: Once | INTRAVENOUS | Status: AC
  Administered 2014-08-28: 20 mg via INTRAVENOUS

## 2014-08-28 MED ORDER — FAMOTIDINE IN NACL 20-0.9 MG/50ML-% IV SOLN
INTRAVENOUS | Status: AC
  Filled 2014-08-28: qty 50

## 2014-08-28 MED ORDER — DEXTROSE 5 % IV SOLN
80.0000 mg/m2 | Freq: Once | INTRAVENOUS | Status: AC
  Administered 2014-08-28: 150 mg via INTRAVENOUS
  Filled 2014-08-28: qty 25

## 2014-08-28 NOTE — Assessment & Plan Note (Signed)
Right breast invasive lobular cancer 5.5 cm multifocal disease with 6/12 lymph nodes positive T3, N2, M0 stage IIIa, ER 92%, PR 98%, HER-2 negative ratio 1.13, Ki-67 10% status post bilateral mastectomies on 04/17/2014  Treatment plan:Adriamycin and Cytoxan dose dense every 2 weeks x4 followed by Taxol x12  Completed 4 cycles of Adriamycin and Cytoxan Current treatment: Week 4/12 [place] her extensively regarding Taxol side effects including neuropathy risks. If she does neuropathy we will switch her to Abraxane. We will also be monitoring of blood counts very closely.  Toxicities of chemotherapy: 1. Grade 3 neutropenia: Chemotherapy dose reduced with cycle 2, Adriamycin 50 mg/m and Cytoxan 500 mg/m 2. Headache related to Zofran: Resolved with discontinuing Zofran 3. Severe heartburn: prescribed Nexium 20 mg daily, Decadron discontinued 4. Small mouth sores 5. Fatigue grade 1 6. Bronchitis/laryngitis 08/14/2014: Resolved with azithromycin 7. Chemotherapy-induced anemia hemoglobin 10.5 being monitored  Return to clinic in 2 weeks for clinic follow-up in weekly for chemotherapy

## 2014-08-28 NOTE — Telephone Encounter (Signed)
3/30 appointment cancelled per pof and lab was moved to 3/31

## 2014-08-28 NOTE — Patient Instructions (Signed)
[facility] Discharge Instructions for Patients Receiving Chemotherapy  Today you received the following chemotherapy agents Taxol  To help prevent nausea and vomiting after your treatment, we encourage you to take your nausea medication as directed/prescribed   If you develop nausea and vomiting that is not controlled by your nausea medication, call the clinic.   BELOW ARE SYMPTOMS THAT SHOULD BE REPORTED IMMEDIATELY:  *FEVER GREATER THAN 100.5 F  *CHILLS WITH OR WITHOUT FEVER  NAUSEA AND VOMITING THAT IS NOT CONTROLLED WITH YOUR NAUSEA MEDICATION  *UNUSUAL SHORTNESS OF BREATH  *UNUSUAL BRUISING OR BLEEDING  TENDERNESS IN MOUTH AND THROAT WITH OR WITHOUT PRESENCE OF ULCERS  *URINARY PROBLEMS  *BOWEL PROBLEMS  UNUSUAL RASH Items with * indicate a potential emergency and should be followed up as soon as possible.  Feel free to call the clinic you have any questions or concerns. The clinic phone number is [phone].  Please show the [place] at check-in to the Emergency Department and triage nurse.

## 2014-08-28 NOTE — Progress Notes (Signed)
Patient Care Team: Colon Branch, MD as PCP - General  DIAGNOSIS: Breast cancer of lower-outer quadrant of right female breast   Staging form: Breast, AJCC 7th Edition     Clinical: Stage IIA (T2(m), N0, M0) - Unsigned     Pathologic: Stage IIIA (T3, N2a, cM0) - Signed by Rulon Eisenmenger, MD on 06/12/2014   SUMMARY OF ONCOLOGIC HISTORY:   Breast cancer of lower-outer quadrant of right female breast   01/31/2014 Breast MRI Right breast mass 3.3 x 2.8 x 3.4 cm with several foci of subcentimeter enhancing nodules extending anteriorly to worsen nipple up to 5.4 cm anterior to the mass    04/17/2014 Surgery Bilateral mastectomies, left: Benign, right breast multifocal ILC grade 2 largest 5.5 cm, posterior margin focally positive, 2 SLN's positive with extracapsular extension + 4/10 axillary LN positive, ER 92%, PR 98%, HER-2 -1.1.3, Ki 67 10%   06/12/2014 -  Chemotherapy Adriamycin and Cytoxan dose dense every 2 weeks x4 followed by Taxol x12    CHIEF COMPLIANT: Follow-up of breast cancer  INTERVAL HISTORY: Olivia Acosta is a 54 year old with above-mentioned history of right-sided breast cancer currently on adjuvant chemotherapy. She is taking Taxol weekly. She reports no nausea or vomiting. Good energy levels. Denies any neuropathy. Denies any fevers or chills.  REVIEW OF SYSTEMS:   Constitutional: Denies fevers, chills or abnormal weight loss Eyes: Denies blurriness of vision Ears, nose, mouth, throat, and face: Denies mucositis or sore throat Respiratory: Denies cough, dyspnea or wheezes Cardiovascular: Denies palpitation, chest discomfort or lower extremity swelling Gastrointestinal:  Denies nausea, heartburn or change in bowel habits Skin: Denies abnormal skin rashes Lymphatics: Denies new lymphadenopathy or easy bruising Neurological:Denies numbness, tingling or new weaknesses Behavioral/Psych: Mood is stable, no new changes  Breast:  denies any pain or lumps or nodules in either  breasts All other systems were reviewed with the patient and are negative.  I have reviewed the past medical history, past surgical history, social history and family history with the patient and they are unchanged from previous note.  ALLERGIES:  is allergic to penicillins.  MEDICATIONS:  Current Outpatient Prescriptions  Medication Sig Dispense Refill  . dexamethasone (DECADRON) 4 MG tablet   1  . esomeprazole (NEXIUM) 20 MG capsule Take 1 capsule (20 mg total) by mouth daily at 12 noon. 30 capsule 1  . lidocaine-prilocaine (EMLA) cream   3  . LORazepam (ATIVAN) 0.5 MG tablet   0  . ondansetron (ZOFRAN) 8 MG tablet   1  . prochlorperazine (COMPAZINE) 10 MG tablet   1  . valACYclovir (VALTREX) 500 MG tablet Take 500 mg by mouth daily.     No current facility-administered medications for this visit.    PHYSICAL EXAMINATION: ECOG PERFORMANCE STATUS: 0 - Asymptomatic  Filed Vitals:   08/28/14 0903  BP: 113/65  Pulse: 72  Temp: 98.2 F (36.8 C)  Resp: 18   Filed Weights   08/28/14 0903  Weight: 157 lb 3.2 oz (71.305 kg)    GENERAL:alert, no distress and comfortable SKIN: skin color, texture, turgor are normal, no rashes or significant lesions EYES: normal, Conjunctiva are pink and non-injected, sclera clear OROPHARYNX:no exudate, no erythema and lips, buccal mucosa, and tongue normal  NECK: supple, thyroid normal size, non-tender, without nodularity LYMPH:  no palpable lymphadenopathy in the cervical, axillary or inguinal LUNGS: clear to auscultation and percussion with normal breathing effort HEART: regular rate & rhythm and no murmurs and no lower extremity edema ABDOMEN:abdomen soft,  non-tender and normal bowel sounds Musculoskeletal:no cyanosis of digits and no clubbing  NEURO: alert & oriented x 3 with fluent speech, no focal motor/sensory deficits  LABORATORY DATA:  I have reviewed the data as listed   Chemistry      Component Value Date/Time   NA 142  08/28/2014 0824   NA 140 04/09/2014 0846   K 4.2 08/28/2014 0824   K 4.1 04/09/2014 0846   CL 103 04/09/2014 0846   CO2 25 08/28/2014 0824   CO2 27 04/09/2014 0846   BUN 14.3 08/28/2014 0824   BUN 21 04/09/2014 0846   CREATININE 0.7 08/28/2014 0824   CREATININE 0.77 04/09/2014 0846      Component Value Date/Time   CALCIUM 9.2 08/28/2014 0824   CALCIUM 9.5 04/09/2014 0846   ALKPHOS 80 08/28/2014 0824   AST 26 08/28/2014 0824   ALT 32 08/28/2014 0824   BILITOT 0.29 08/28/2014 0824       Lab Results  Component Value Date   WBC 3.2* 08/28/2014   HGB 10.6* 08/28/2014   HCT 32.2* 08/28/2014   MCV 90.8 08/28/2014   PLT 246 08/28/2014   NEUTROABS 2.1 08/28/2014   ASSESSMENT & PLAN:  Breast cancer of lower-outer quadrant of right female breast Right breast invasive lobular cancer 5.5 cm multifocal disease with 6/12 lymph nodes positive T3, N2, M0 stage IIIa, ER 92%, PR 98%, HER-2 negative ratio 1.13, Ki-67 10% status post bilateral mastectomies on 04/17/2014  Treatment plan:Adriamycin and Cytoxan dose dense every 2 weeks x4 followed by Taxol x12  Completed 4 cycles of Adriamycin and Cytoxan Current treatment: Week 4/12 Taxol Counseled her extensively regarding Taxol side effects including neuropathy risks. If she does neuropathy we will switch her to Abraxane. We will also be monitoring of blood counts very closely.  Toxicities of chemotherapy: 1. Grade 3 neutropenia: Chemotherapy dose reduced with cycle 2, Adriamycin 50 mg/m and Cytoxan 500 mg/m 2. Headache related to Zofran: Resolved with discontinuing Zofran 3. Severe heartburn: prescribed Nexium 20 mg daily, Decadron discontinued 4. Small mouth sores 5. Fatigue grade 1 6. Bronchitis/laryngitis 08/14/2014: Resolved with azithromycin 7. Chemotherapy-induced anemia hemoglobin 10.6 being monitored  The patient's neutrophil count has been slowly trending downwards. I anticipate that in the future we may have to reduce the  dosage of Paxil. She does not have any neuropathy. Return to clinic in 2 weeks for clinic follow-up in weekly for chemotherapy  No orders of the defined types were placed in this encounter.   The patient has a good understanding of the overall plan. she agrees with it. She will call with any problems that may develop before her next visit here.   Rulon Eisenmenger, MD

## 2014-09-03 ENCOUNTER — Ambulatory Visit: Payer: BLUE CROSS/BLUE SHIELD | Admitting: Hematology and Oncology

## 2014-09-03 ENCOUNTER — Other Ambulatory Visit: Payer: BLUE CROSS/BLUE SHIELD

## 2014-09-04 ENCOUNTER — Other Ambulatory Visit (HOSPITAL_BASED_OUTPATIENT_CLINIC_OR_DEPARTMENT_OTHER): Payer: BLUE CROSS/BLUE SHIELD

## 2014-09-04 ENCOUNTER — Ambulatory Visit (HOSPITAL_BASED_OUTPATIENT_CLINIC_OR_DEPARTMENT_OTHER): Payer: BLUE CROSS/BLUE SHIELD

## 2014-09-04 ENCOUNTER — Other Ambulatory Visit: Payer: BLUE CROSS/BLUE SHIELD

## 2014-09-04 VITALS — BP 110/65 | HR 59 | Temp 97.8°F | Resp 18

## 2014-09-04 DIAGNOSIS — C773 Secondary and unspecified malignant neoplasm of axilla and upper limb lymph nodes: Secondary | ICD-10-CM | POA: Diagnosis not present

## 2014-09-04 DIAGNOSIS — C50511 Malignant neoplasm of lower-outer quadrant of right female breast: Secondary | ICD-10-CM | POA: Diagnosis not present

## 2014-09-04 DIAGNOSIS — Z5111 Encounter for antineoplastic chemotherapy: Secondary | ICD-10-CM | POA: Diagnosis not present

## 2014-09-04 DIAGNOSIS — C50911 Malignant neoplasm of unspecified site of right female breast: Secondary | ICD-10-CM

## 2014-09-04 LAB — COMPREHENSIVE METABOLIC PANEL (CC13)
ALT: 37 U/L (ref 0–55)
ANION GAP: 13 meq/L — AB (ref 3–11)
AST: 29 U/L (ref 5–34)
Albumin: 3.7 g/dL (ref 3.5–5.0)
Alkaline Phosphatase: 73 U/L (ref 40–150)
BUN: 16.4 mg/dL (ref 7.0–26.0)
CALCIUM: 9 mg/dL (ref 8.4–10.4)
CO2: 24 mEq/L (ref 22–29)
Chloride: 105 mEq/L (ref 98–109)
Creatinine: 0.7 mg/dL (ref 0.6–1.1)
GLUCOSE: 131 mg/dL (ref 70–140)
Potassium: 4.2 mEq/L (ref 3.5–5.1)
Sodium: 142 mEq/L (ref 136–145)
Total Bilirubin: 0.41 mg/dL (ref 0.20–1.20)
Total Protein: 6.4 g/dL (ref 6.4–8.3)

## 2014-09-04 LAB — CBC WITH DIFFERENTIAL/PLATELET
BASO%: 2.4 % — AB (ref 0.0–2.0)
Basophils Absolute: 0.1 10*3/uL (ref 0.0–0.1)
EOS%: 4.9 % (ref 0.0–7.0)
Eosinophils Absolute: 0.2 10*3/uL (ref 0.0–0.5)
HCT: 31.9 % — ABNORMAL LOW (ref 34.8–46.6)
HEMOGLOBIN: 10.4 g/dL — AB (ref 11.6–15.9)
LYMPH%: 13.1 % — ABNORMAL LOW (ref 14.0–49.7)
MCH: 29.9 pg (ref 25.1–34.0)
MCHC: 32.7 g/dL (ref 31.5–36.0)
MCV: 91.5 fL (ref 79.5–101.0)
MONO#: 0.3 10*3/uL (ref 0.1–0.9)
MONO%: 5.7 % (ref 0.0–14.0)
NEUT#: 3.3 10*3/uL (ref 1.5–6.5)
NEUT%: 73.9 % (ref 38.4–76.8)
PLATELETS: 222 10*3/uL (ref 145–400)
RBC: 3.48 10*6/uL — AB (ref 3.70–5.45)
RDW: 19.1 % — ABNORMAL HIGH (ref 11.2–14.5)
WBC: 4.5 10*3/uL (ref 3.9–10.3)
lymph#: 0.6 10*3/uL — ABNORMAL LOW (ref 0.9–3.3)

## 2014-09-04 MED ORDER — FAMOTIDINE IN NACL 20-0.9 MG/50ML-% IV SOLN
20.0000 mg | Freq: Once | INTRAVENOUS | Status: AC
  Administered 2014-09-04: 20 mg via INTRAVENOUS

## 2014-09-04 MED ORDER — SODIUM CHLORIDE 0.9 % IV SOLN
Freq: Once | INTRAVENOUS | Status: AC
  Administered 2014-09-04: 16:00:00 via INTRAVENOUS
  Filled 2014-09-04: qty 4

## 2014-09-04 MED ORDER — HEPARIN SOD (PORK) LOCK FLUSH 100 UNIT/ML IV SOLN
500.0000 [IU] | Freq: Once | INTRAVENOUS | Status: DC | PRN
  Filled 2014-09-04: qty 5

## 2014-09-04 MED ORDER — DIPHENHYDRAMINE HCL 50 MG/ML IJ SOLN
INTRAMUSCULAR | Status: AC
  Filled 2014-09-04: qty 1

## 2014-09-04 MED ORDER — DIPHENHYDRAMINE HCL 50 MG/ML IJ SOLN
50.0000 mg | Freq: Once | INTRAMUSCULAR | Status: AC
  Administered 2014-09-04: 50 mg via INTRAVENOUS

## 2014-09-04 MED ORDER — SODIUM CHLORIDE 0.9 % IV SOLN
Freq: Once | INTRAVENOUS | Status: AC
  Administered 2014-09-04: 16:00:00 via INTRAVENOUS

## 2014-09-04 MED ORDER — PACLITAXEL CHEMO INJECTION 300 MG/50ML
80.0000 mg/m2 | Freq: Once | INTRAVENOUS | Status: AC
  Administered 2014-09-04: 150 mg via INTRAVENOUS
  Filled 2014-09-04: qty 25

## 2014-09-04 MED ORDER — SODIUM CHLORIDE 0.9 % IJ SOLN
10.0000 mL | INTRAMUSCULAR | Status: DC | PRN
  Filled 2014-09-04: qty 10

## 2014-09-04 MED ORDER — FAMOTIDINE IN NACL 20-0.9 MG/50ML-% IV SOLN
INTRAVENOUS | Status: AC
  Filled 2014-09-04: qty 50

## 2014-09-04 NOTE — Patient Instructions (Signed)
[facility] Discharge Instructions for Patients Receiving Chemotherapy  Today you received the following chemotherapy agents Taxol  To help prevent nausea and vomiting after your treatment, we encourage you to take your nausea medication as directed/prescribed   If you develop nausea and vomiting that is not controlled by your nausea medication, call the clinic.   BELOW ARE SYMPTOMS THAT SHOULD BE REPORTED IMMEDIATELY:  *FEVER GREATER THAN 100.5 F  *CHILLS WITH OR WITHOUT FEVER  NAUSEA AND VOMITING THAT IS NOT CONTROLLED WITH YOUR NAUSEA MEDICATION  *UNUSUAL SHORTNESS OF BREATH  *UNUSUAL BRUISING OR BLEEDING  TENDERNESS IN MOUTH AND THROAT WITH OR WITHOUT PRESENCE OF ULCERS  *URINARY PROBLEMS  *BOWEL PROBLEMS  UNUSUAL RASH Items with * indicate a potential emergency and should be followed up as soon as possible.  Feel free to call the clinic you have any questions or concerns. The clinic phone number is [phone].  Please show the [place] at check-in to the Emergency Department and triage nurse.

## 2014-09-10 ENCOUNTER — Other Ambulatory Visit: Payer: Self-pay

## 2014-09-10 DIAGNOSIS — C50511 Malignant neoplasm of lower-outer quadrant of right female breast: Secondary | ICD-10-CM

## 2014-09-11 ENCOUNTER — Ambulatory Visit: Payer: BLUE CROSS/BLUE SHIELD

## 2014-09-11 ENCOUNTER — Ambulatory Visit (HOSPITAL_BASED_OUTPATIENT_CLINIC_OR_DEPARTMENT_OTHER): Payer: BLUE CROSS/BLUE SHIELD

## 2014-09-11 ENCOUNTER — Other Ambulatory Visit (HOSPITAL_BASED_OUTPATIENT_CLINIC_OR_DEPARTMENT_OTHER): Payer: BLUE CROSS/BLUE SHIELD

## 2014-09-11 ENCOUNTER — Telehealth: Payer: Self-pay | Admitting: Hematology and Oncology

## 2014-09-11 ENCOUNTER — Ambulatory Visit (HOSPITAL_BASED_OUTPATIENT_CLINIC_OR_DEPARTMENT_OTHER): Payer: BLUE CROSS/BLUE SHIELD | Admitting: Hematology and Oncology

## 2014-09-11 VITALS — BP 108/66 | HR 75 | Temp 98.0°F | Resp 18 | Ht 67.0 in | Wt 157.2 lb

## 2014-09-11 DIAGNOSIS — D6481 Anemia due to antineoplastic chemotherapy: Secondary | ICD-10-CM

## 2014-09-11 DIAGNOSIS — D701 Agranulocytosis secondary to cancer chemotherapy: Secondary | ICD-10-CM

## 2014-09-11 DIAGNOSIS — C773 Secondary and unspecified malignant neoplasm of axilla and upper limb lymph nodes: Secondary | ICD-10-CM

## 2014-09-11 DIAGNOSIS — Z5111 Encounter for antineoplastic chemotherapy: Secondary | ICD-10-CM | POA: Diagnosis not present

## 2014-09-11 DIAGNOSIS — Z170 Estrogen receptor positive status [ER+]: Secondary | ICD-10-CM

## 2014-09-11 DIAGNOSIS — C50511 Malignant neoplasm of lower-outer quadrant of right female breast: Secondary | ICD-10-CM

## 2014-09-11 LAB — COMPREHENSIVE METABOLIC PANEL (CC13)
ALT: 35 U/L (ref 0–55)
ANION GAP: 13 meq/L — AB (ref 3–11)
AST: 32 U/L (ref 5–34)
Albumin: 4 g/dL (ref 3.5–5.0)
Alkaline Phosphatase: 80 U/L (ref 40–150)
BILIRUBIN TOTAL: 0.4 mg/dL (ref 0.20–1.20)
BUN: 15.4 mg/dL (ref 7.0–26.0)
CHLORIDE: 105 meq/L (ref 98–109)
CO2: 25 meq/L (ref 22–29)
CREATININE: 0.7 mg/dL (ref 0.6–1.1)
Calcium: 9.2 mg/dL (ref 8.4–10.4)
GLUCOSE: 108 mg/dL (ref 70–140)
Potassium: 3.7 mEq/L (ref 3.5–5.1)
Sodium: 142 mEq/L (ref 136–145)
Total Protein: 6.8 g/dL (ref 6.4–8.3)

## 2014-09-11 LAB — CBC WITH DIFFERENTIAL/PLATELET
BASO%: 2.8 % — ABNORMAL HIGH (ref 0.0–2.0)
Basophils Absolute: 0.1 10*3/uL (ref 0.0–0.1)
EOS%: 5.8 % (ref 0.0–7.0)
Eosinophils Absolute: 0.2 10*3/uL (ref 0.0–0.5)
HCT: 33.1 % — ABNORMAL LOW (ref 34.8–46.6)
HGB: 10.8 g/dL — ABNORMAL LOW (ref 11.6–15.9)
LYMPH%: 10.1 % — ABNORMAL LOW (ref 14.0–49.7)
MCH: 30.1 pg (ref 25.1–34.0)
MCHC: 32.5 g/dL (ref 31.5–36.0)
MCV: 92.5 fL (ref 79.5–101.0)
MONO#: 0.3 10*3/uL (ref 0.1–0.9)
MONO%: 9.7 % (ref 0.0–14.0)
NEUT#: 2.2 10*3/uL (ref 1.5–6.5)
NEUT%: 71.6 % (ref 38.4–76.8)
PLATELETS: 201 10*3/uL (ref 145–400)
RBC: 3.58 10*6/uL — AB (ref 3.70–5.45)
RDW: 18.2 % — ABNORMAL HIGH (ref 11.2–14.5)
WBC: 3 10*3/uL — ABNORMAL LOW (ref 3.9–10.3)
lymph#: 0.3 10*3/uL — ABNORMAL LOW (ref 0.9–3.3)

## 2014-09-11 MED ORDER — HEPARIN SOD (PORK) LOCK FLUSH 100 UNIT/ML IV SOLN
500.0000 [IU] | Freq: Once | INTRAVENOUS | Status: AC | PRN
  Administered 2014-09-11: 500 [IU]
  Filled 2014-09-11: qty 5

## 2014-09-11 MED ORDER — DIPHENHYDRAMINE HCL 50 MG/ML IJ SOLN
50.0000 mg | Freq: Once | INTRAMUSCULAR | Status: AC
  Administered 2014-09-11: 50 mg via INTRAVENOUS

## 2014-09-11 MED ORDER — FAMOTIDINE IN NACL 20-0.9 MG/50ML-% IV SOLN
20.0000 mg | Freq: Once | INTRAVENOUS | Status: AC
  Administered 2014-09-11: 20 mg via INTRAVENOUS

## 2014-09-11 MED ORDER — SODIUM CHLORIDE 0.9 % IV SOLN
Freq: Once | INTRAVENOUS | Status: AC
  Administered 2014-09-11: 15:00:00 via INTRAVENOUS

## 2014-09-11 MED ORDER — FAMOTIDINE IN NACL 20-0.9 MG/50ML-% IV SOLN
INTRAVENOUS | Status: AC
  Filled 2014-09-11: qty 50

## 2014-09-11 MED ORDER — SODIUM CHLORIDE 0.9 % IJ SOLN
10.0000 mL | INTRAMUSCULAR | Status: DC | PRN
  Administered 2014-09-11: 10 mL
  Filled 2014-09-11: qty 10

## 2014-09-11 MED ORDER — SODIUM CHLORIDE 0.9 % IV SOLN
Freq: Once | INTRAVENOUS | Status: AC
  Administered 2014-09-11: 16:00:00 via INTRAVENOUS
  Filled 2014-09-11: qty 4

## 2014-09-11 MED ORDER — DIPHENHYDRAMINE HCL 50 MG/ML IJ SOLN
INTRAMUSCULAR | Status: AC
  Filled 2014-09-11: qty 1

## 2014-09-11 MED ORDER — PACLITAXEL CHEMO INJECTION 300 MG/50ML
80.0000 mg/m2 | Freq: Once | INTRAVENOUS | Status: AC
  Administered 2014-09-11: 150 mg via INTRAVENOUS
  Filled 2014-09-11: qty 25

## 2014-09-11 NOTE — Assessment & Plan Note (Signed)
Right breast invasive lobular cancer 5.5 cm multifocal disease with 6/12 lymph nodes positive T3, N2, M0 stage IIIa, ER 92%, PR 98%, HER-2 negative ratio 1.13, Ki-67 10% status post bilateral mastectomies on 04/17/2014  Treatment plan:Adriamycin and Cytoxan dose dense every 2 weeks x4 followed by Taxol x12  Completed 4 cycles of Adriamycin and Cytoxan Current treatment: Week 6/12 Taxol Counseled her extensively regarding Taxol side effects including neuropathy risks. If she does neuropathy we will switch her to Abraxane. We will also be monitoring of blood counts very closely.  Toxicities of chemotherapy: 1. Grade 3 neutropenia: Chemotherapy dose reduced with cycle 2, Adriamycin 50 mg/m and Cytoxan 500 mg/m 2. Headache related to Zofran: Resolved with discontinuing Zofran 3. Severe heartburn: prescribed Nexium 20 mg daily, Decadron discontinued 4. Small mouth sores 5. Fatigue grade 1 6. Bronchitis/laryngitis 08/14/2014: Resolved with azithromycin 7. Chemotherapy-induced anemia hemoglobin 10.6 being monitored 8. Allergies: Taking Claritin-D but still has bronchitis-like symptoms if it does not get better we might have to treat her with antibiotics  The patient's neutrophil count has been slowly trending downwards. I anticipate that in the future we may have to reduce the dosage of Taxol. She does not have any neuropathy. Return to clinic in 3 weeks for clinic follow-up in weekly for chemotherapy  

## 2014-09-11 NOTE — Progress Notes (Signed)
Patient Care Team: Colon Branch, MD as PCP - General  DIAGNOSIS: Breast cancer of lower-outer quadrant of right female breast   Staging form: Breast, AJCC 7th Edition     Clinical: Stage IIA (T2(m), N0, M0) - Unsigned     Pathologic: Stage IIIA (T3, N2a, cM0) - Signed by Rulon Eisenmenger, MD on 06/12/2014   SUMMARY OF ONCOLOGIC HISTORY:   Breast cancer of lower-outer quadrant of right female breast   01/31/2014 Breast MRI Right breast mass 3.3 x 2.8 x 3.4 cm with several foci of subcentimeter enhancing nodules extending anteriorly to worsen nipple up to 5.4 cm anterior to the mass    04/17/2014 Surgery Bilateral mastectomies, left: Benign, right breast multifocal ILC grade 2 largest 5.5 cm, posterior margin focally positive, 2 SLN's positive with extracapsular extension + 4/10 axillary LN positive, ER 92%, PR 98%, HER-2 -1.1.3, Ki 67 10%   06/12/2014 -  Chemotherapy Adriamycin and Cytoxan dose dense every 2 weeks x4 followed by Taxol x12    CHIEF COMPLIANT: Weeks 6/12 Taxol  INTERVAL HISTORY: Olivia Acosta is a 54 year old with above-mentioned history of right breast cancer currently on adjuvant chemotherapy with Taxol. Today is week 6. She is tolerating it fairly well. Denies any nausea vomiting. Studies in energy levels. She had a small subconjunctival hemorrhage. She also complains of allergy symptoms with cough and upper respiratory discomfort. Denies any fevers or chills.  REVIEW OF SYSTEMS:   Constitutional: Denies fevers, chills or abnormal weight loss Eyes: Denies blurriness of vision Ears, nose, mouth, throat, and face: Denies mucositis or sore throat Respiratory: Denies cough, dyspnea or wheezes Cardiovascular: Denies palpitation, chest discomfort or lower extremity swelling Gastrointestinal:  Denies nausea, heartburn or change in bowel habits Skin: Denies abnormal skin rashes Lymphatics: Denies new lymphadenopathy or easy bruising Neurological:mild tingling of the tips of  fingers Behavioral/Psych: Mood is stable, no new changes  Breast:  denies any pain or lumps or nodules in either breasts All other systems were reviewed with the patient and are negative.  I have reviewed the past medical history, past surgical history, social history and family history with the patient and they are unchanged from previous note.  ALLERGIES:  is allergic to penicillins.  MEDICATIONS:  Current Outpatient Prescriptions  Medication Sig Dispense Refill  . dexamethasone (DECADRON) 4 MG tablet   1  . esomeprazole (NEXIUM) 20 MG capsule Take 1 capsule (20 mg total) by mouth daily at 12 noon. 30 capsule 1  . lidocaine-prilocaine (EMLA) cream   3  . LORazepam (ATIVAN) 0.5 MG tablet   0  . ondansetron (ZOFRAN) 8 MG tablet   1  . prochlorperazine (COMPAZINE) 10 MG tablet   1  . valACYclovir (VALTREX) 500 MG tablet Take 500 mg by mouth daily.     No current facility-administered medications for this visit.    PHYSICAL EXAMINATION: ECOG PERFORMANCE STATUS: 1 - Symptomatic but completely ambulatory  Filed Vitals:   09/11/14 1431  BP: 108/66  Pulse: 75  Temp: 98 F (36.7 C)  Resp: 18   Filed Weights   09/11/14 1431  Weight: 157 lb 3.2 oz (71.305 kg)    GENERAL:alert, no distress and comfortable SKIN: skin color, texture, turgor are normal, no rashes or significant lesions EYES: normal, Conjunctiva are pink and non-injected, sclera clear OROPHARYNX:no exudate, no erythema and lips, buccal mucosa, and tongue normal  NECK: supple, thyroid normal size, non-tender, without nodularity LYMPH:  no palpable lymphadenopathy in the cervical, axillary or inguinal LUNGS:  clear to auscultation and percussion with normal breathing effort HEART: regular rate & rhythm and no murmurs and no lower extremity edema ABDOMEN:abdomen soft, non-tender and normal bowel sounds Musculoskeletal:no cyanosis of digits and no clubbing  NEURO: alert & oriented x 3 with fluent speech, no focal  motor/sensory deficits  LABORATORY DATA:  I have reviewed the data as listed   Chemistry      Component Value Date/Time   NA 142 09/11/2014 1343   NA 140 04/09/2014 0846   K 3.7 09/11/2014 1343   K 4.1 04/09/2014 0846   CL 103 04/09/2014 0846   CO2 25 09/11/2014 1343   CO2 27 04/09/2014 0846   BUN 15.4 09/11/2014 1343   BUN 21 04/09/2014 0846   CREATININE 0.7 09/11/2014 1343   CREATININE 0.77 04/09/2014 0846      Component Value Date/Time   CALCIUM 9.2 09/11/2014 1343   CALCIUM 9.5 04/09/2014 0846   ALKPHOS 80 09/11/2014 1343   AST 32 09/11/2014 1343   ALT 35 09/11/2014 1343   BILITOT 0.40 09/11/2014 1343       Lab Results  Component Value Date   WBC 3.0* 09/11/2014   HGB 10.8* 09/11/2014   HCT 33.1* 09/11/2014   MCV 92.5 09/11/2014   PLT 201 09/11/2014   NEUTROABS 2.2 09/11/2014    ASSESSMENT & PLAN:  Breast cancer of lower-outer quadrant of right female breast Right breast invasive lobular cancer 5.5 cm multifocal disease with 6/12 lymph nodes positive T3, N2, M0 stage IIIa, ER 92%, PR 98%, HER-2 negative ratio 1.13, Ki-67 10% status post bilateral mastectomies on 04/17/2014  Treatment plan:Adriamycin and Cytoxan dose dense every 2 weeks x4 followed by Taxol x12  Completed 4 cycles of Adriamycin and Cytoxan Current treatment: Week 6/12 Taxol Counseled her extensively regarding Taxol side effects including neuropathy risks. If she does neuropathy we will switch her to Abraxane. We will also be monitoring of blood counts very closely.  Toxicities of chemotherapy: 1. Grade 3 neutropenia: Chemotherapy dose reduced with cycle 2, Adriamycin 50 mg/m and Cytoxan 500 mg/m 2. Headache related to Zofran: Resolved with discontinuing Zofran 3. Severe heartburn: prescribed Nexium 20 mg daily, Decadron discontinued 4. Small mouth sores 5. Fatigue grade 1 6. Bronchitis/laryngitis 08/14/2014: Resolved with azithromycin 7. Chemotherapy-induced anemia hemoglobin 10.6 being  monitored 8. Allergies: Taking Claritin-D but still has bronchitis-like symptoms if it does not get better we might have to treat her with antibiotics  The patient's neutrophil count has been slowly trending downwards. I anticipate that in the future we may have to reduce the dosage of Taxol. She does not have any neuropathy. Return to clinic in 3 weeks for clinic follow-up in weekly for chemotherapy     No orders of the defined types were placed in this encounter.   The patient has a good understanding of the overall plan. she agrees with it. She will call with any problems that may develop before her next visit here.   Rulon Eisenmenger, MD

## 2014-09-11 NOTE — Telephone Encounter (Signed)
Appointment added and patient will get a new avs in chemo today   anne

## 2014-09-11 NOTE — Progress Notes (Signed)
Discharged at 1735 ambulatory in no distress.

## 2014-09-11 NOTE — Patient Instructions (Signed)
[facility] Discharge Instructions for Patients Receiving Chemotherapy  Today you received the following chemotherapy agents taxol  To help prevent nausea and vomiting after your treatment, we encourage you to take your nausea medication as prescribed   If you develop nausea and vomiting that is not controlled by your nausea medication, call the clinic.   BELOW ARE SYMPTOMS THAT SHOULD BE REPORTED IMMEDIATELY:  *FEVER GREATER THAN 100.5 F  *CHILLS WITH OR WITHOUT FEVER  NAUSEA AND VOMITING THAT IS NOT CONTROLLED WITH YOUR NAUSEA MEDICATION  *UNUSUAL SHORTNESS OF BREATH  *UNUSUAL BRUISING OR BLEEDING  TENDERNESS IN MOUTH AND THROAT WITH OR WITHOUT PRESENCE OF ULCERS  *URINARY PROBLEMS  *BOWEL PROBLEMS  UNUSUAL RASH Items with * indicate a potential emergency and should be followed up as soon as possible.  Feel free to call the clinic you have any questions or concerns. The clinic phone number is [phone].  Please show the CHEMO ALERT CARD at check-in to the Emergency Department and triage nurse.   

## 2014-09-14 ENCOUNTER — Encounter (HOSPITAL_COMMUNITY): Payer: Self-pay | Admitting: *Deleted

## 2014-09-14 ENCOUNTER — Emergency Department (HOSPITAL_COMMUNITY): Payer: BLUE CROSS/BLUE SHIELD

## 2014-09-14 ENCOUNTER — Inpatient Hospital Stay (HOSPITAL_COMMUNITY)
Admission: EM | Admit: 2014-09-14 | Discharge: 2014-09-16 | DRG: 203 | Disposition: A | Discharge status: Home / Self Care | Payer: BLUE CROSS/BLUE SHIELD | Attending: Internal Medicine | Admitting: Internal Medicine

## 2014-09-14 DIAGNOSIS — R5081 Fever presenting with conditions classified elsewhere: Secondary | ICD-10-CM

## 2014-09-14 DIAGNOSIS — C50519 Malignant neoplasm of lower-outer quadrant of unspecified female breast: Secondary | ICD-10-CM | POA: Diagnosis present

## 2014-09-14 DIAGNOSIS — Z79899 Other long term (current) drug therapy: Secondary | ICD-10-CM

## 2014-09-14 DIAGNOSIS — J209 Acute bronchitis, unspecified: Principal | ICD-10-CM

## 2014-09-14 DIAGNOSIS — C50511 Malignant neoplasm of lower-outer quadrant of right female breast: Secondary | ICD-10-CM | POA: Diagnosis present

## 2014-09-14 DIAGNOSIS — J208 Acute bronchitis due to other specified organisms: Secondary | ICD-10-CM

## 2014-09-14 DIAGNOSIS — R059 Cough, unspecified: Secondary | ICD-10-CM

## 2014-09-14 DIAGNOSIS — R6889 Other general symptoms and signs: Secondary | ICD-10-CM | POA: Diagnosis not present

## 2014-09-14 DIAGNOSIS — Z9013 Acquired absence of bilateral breasts and nipples: Secondary | ICD-10-CM | POA: Diagnosis present

## 2014-09-14 DIAGNOSIS — R509 Fever, unspecified: Secondary | ICD-10-CM | POA: Diagnosis not present

## 2014-09-14 DIAGNOSIS — D72819 Decreased white blood cell count, unspecified: Secondary | ICD-10-CM | POA: Diagnosis present

## 2014-09-14 DIAGNOSIS — D709 Neutropenia, unspecified: Secondary | ICD-10-CM | POA: Diagnosis present

## 2014-09-14 DIAGNOSIS — R05 Cough: Secondary | ICD-10-CM

## 2014-09-14 LAB — URINALYSIS, ROUTINE W REFLEX MICROSCOPIC
BILIRUBIN URINE: NEGATIVE
Glucose, UA: NEGATIVE mg/dL
Hgb urine dipstick: NEGATIVE
Ketones, ur: NEGATIVE mg/dL
LEUKOCYTES UA: NEGATIVE
NITRITE: NEGATIVE
Protein, ur: NEGATIVE mg/dL
Specific Gravity, Urine: 1.003 — ABNORMAL LOW (ref 1.005–1.030)
Urobilinogen, UA: 0.2 mg/dL (ref 0.0–1.0)
pH: 7 (ref 5.0–8.0)

## 2014-09-14 LAB — COMPREHENSIVE METABOLIC PANEL
ALK PHOS: 57 U/L (ref 39–117)
ALT: 25 U/L (ref 0–35)
AST: 27 U/L (ref 0–37)
Albumin: 3.7 g/dL (ref 3.5–5.2)
Anion gap: 10 (ref 5–15)
BUN: 14 mg/dL (ref 6–23)
CALCIUM: 8.4 mg/dL (ref 8.4–10.5)
CO2: 26 mmol/L (ref 19–32)
Chloride: 102 mmol/L (ref 96–112)
Creatinine, Ser: 0.68 mg/dL (ref 0.50–1.10)
GFR calc Af Amer: >90 mL/min (ref >90)
GFR calc non Af Amer: >90 mL/min (ref >90)
Glucose, Bld: 106 mg/dL — ABNORMAL HIGH (ref 70–99)
POTASSIUM: 3.5 mmol/L (ref 3.5–5.1)
Sodium: 138 mmol/L (ref 135–145)
Total Bilirubin: 0.5 mg/dL (ref 0.3–1.2)
Total Protein: 6.2 g/dL (ref 6.0–8.3)

## 2014-09-14 LAB — CBC WITH DIFFERENTIAL/PLATELET
Basophils Absolute: 0 10*3/uL (ref 0.0–0.1)
Basophils Relative: 0 % (ref 0–1)
Eosinophils Absolute: 0.1 10*3/uL (ref 0.0–0.7)
Eosinophils Relative: 3 % (ref 0–5)
HEMATOCRIT: 28.5 % — AB (ref 36.0–46.0)
HEMOGLOBIN: 9.6 g/dL — AB (ref 12.0–15.0)
Lymphocytes Relative: 8 % — ABNORMAL LOW (ref 12–46)
Lymphs Abs: 0.2 10*3/uL — ABNORMAL LOW (ref 0.7–4.0)
MCH: 31.5 pg (ref 26.0–34.0)
MCHC: 33.7 g/dL (ref 30.0–36.0)
MCV: 93.4 fL (ref 78.0–100.0)
MONO ABS: 0.2 10*3/uL (ref 0.1–1.0)
Monocytes Relative: 8 % (ref 3–12)
Neutro Abs: 2.2 10*3/uL (ref 1.7–7.7)
Neutrophils Relative %: 81 % — ABNORMAL HIGH (ref 43–77)
Platelets: 171 10*3/uL (ref 150–400)
RBC: 3.05 MIL/uL — ABNORMAL LOW (ref 3.87–5.11)
RDW: 16.4 % — ABNORMAL HIGH (ref 11.5–15.5)
WBC: 2.7 10*3/uL — ABNORMAL LOW (ref 4.0–10.5)

## 2014-09-14 LAB — RAPID STREP SCREEN (MED CTR MEBANE ONLY): Streptococcus, Group A Screen (Direct): NEGATIVE

## 2014-09-14 LAB — LACTIC ACID, PLASMA
LACTIC ACID, VENOUS: 1.2 mmol/L (ref 0.5–2.0)
Lactic Acid, Venous: 0.6 mmol/L (ref 0.5–2.0)

## 2014-09-14 LAB — INFLUENZA PANEL BY PCR (TYPE A & B)
H1N1 flu by pcr: NOT DETECTED
Influenza A By PCR: NEGATIVE
Influenza B By PCR: NEGATIVE

## 2014-09-14 MED ORDER — AZITHROMYCIN 250 MG PO TABS
250.0000 mg | ORAL_TABLET | Freq: Every day | ORAL | Status: DC
  Administered 2014-09-15 – 2014-09-16 (×2): 250 mg via ORAL
  Filled 2014-09-14 (×2): qty 1

## 2014-09-14 MED ORDER — VALACYCLOVIR HCL 500 MG PO TABS
500.0000 mg | ORAL_TABLET | Freq: Every day | ORAL | Status: DC
  Administered 2014-09-15 – 2014-09-16 (×2): 500 mg via ORAL
  Filled 2014-09-14 (×2): qty 1

## 2014-09-14 MED ORDER — DEXTROMETHORPHAN POLISTIREX 30 MG/5ML PO LQCR
15.0000 mg | Freq: Two times a day (BID) | ORAL | Status: DC
  Administered 2014-09-14 – 2014-09-16 (×5): 15 mg via ORAL
  Filled 2014-09-14 (×7): qty 5

## 2014-09-14 MED ORDER — DEXTROSE 5 % IV SOLN
2.0000 g | Freq: Three times a day (TID) | INTRAVENOUS | Status: DC
  Administered 2014-09-14 – 2014-09-16 (×6): 2 g via INTRAVENOUS
  Filled 2014-09-14 (×7): qty 2

## 2014-09-14 MED ORDER — ALUM & MAG HYDROXIDE-SIMETH 200-200-20 MG/5ML PO SUSP: 30.0000 mL | Freq: Four times a day (QID) | ORAL | Status: DC | PRN

## 2014-09-14 MED ORDER — SODIUM CHLORIDE 0.9 % IV SOLN: INTRAVENOUS | Status: DC

## 2014-09-14 MED ORDER — VANCOMYCIN HCL IN DEXTROSE 750-5 MG/150ML-% IV SOLN
750.0000 mg | Freq: Three times a day (TID) | INTRAVENOUS | Status: DC
  Administered 2014-09-14 – 2014-09-16 (×6): 750 mg via INTRAVENOUS
  Filled 2014-09-14 (×7): qty 150

## 2014-09-14 MED ORDER — SODIUM CHLORIDE 0.9 % IJ SOLN
3.0000 mL | Freq: Two times a day (BID) | INTRAMUSCULAR | Status: DC
  Administered 2014-09-15 – 2014-09-16 (×2): 3 mL via INTRAVENOUS

## 2014-09-14 MED ORDER — SODIUM CHLORIDE 0.9 % IJ SOLN
3.0000 mL | INTRAMUSCULAR | Status: DC | PRN
  Administered 2014-09-16: 3 mL via INTRAVENOUS
  Filled 2014-09-14: qty 3

## 2014-09-14 MED ORDER — LIDOCAINE-PRILOCAINE 2.5-2.5 % EX CREA
TOPICAL_CREAM | Freq: Once | CUTANEOUS | Status: AC
  Administered 2014-09-14: 12:00:00 via TOPICAL
  Filled 2014-09-14: qty 5

## 2014-09-14 MED ORDER — SODIUM CHLORIDE 0.9 % IV BOLUS (SEPSIS)
500.0000 mL | Freq: Once | INTRAVENOUS | Status: AC
  Administered 2014-09-14: 500 mL via INTRAVENOUS

## 2014-09-14 MED ORDER — ONDANSETRON HCL 4 MG/2ML IJ SOLN: 4.0000 mg | Freq: Four times a day (QID) | INTRAMUSCULAR | Status: DC | PRN

## 2014-09-14 MED ORDER — OXYMETAZOLINE HCL 0.05 % NA SOLN: 1.0000 | Freq: Two times a day (BID) | NASAL | Status: DC | PRN

## 2014-09-14 MED ORDER — LORATADINE 10 MG PO TABS
10.0000 mg | ORAL_TABLET | Freq: Every day | ORAL | Status: DC
  Administered 2014-09-14 – 2014-09-16 (×3): 10 mg via ORAL
  Filled 2014-09-14 (×4): qty 1

## 2014-09-14 MED ORDER — ONDANSETRON HCL 4 MG PO TABS: 4.0000 mg | ORAL_TABLET | Freq: Four times a day (QID) | ORAL | Status: DC | PRN

## 2014-09-14 MED ORDER — LORAZEPAM 0.5 MG PO TABS: 0.5000 mg | ORAL_TABLET | Freq: Four times a day (QID) | ORAL | Status: DC | PRN

## 2014-09-14 MED ORDER — AZITHROMYCIN 500 MG PO TABS
500.0000 mg | ORAL_TABLET | Freq: Every day | ORAL | Status: AC
  Administered 2014-09-14: 500 mg via ORAL
  Filled 2014-09-14: qty 1

## 2014-09-14 MED ORDER — ACETAMINOPHEN 500 MG PO TABS
500.0000 mg | ORAL_TABLET | Freq: Four times a day (QID) | ORAL | Status: DC | PRN
  Administered 2014-09-14 – 2014-09-15 (×3): 500 mg via ORAL
  Filled 2014-09-14 (×3): qty 1

## 2014-09-14 MED ORDER — ENOXAPARIN SODIUM 40 MG/0.4ML ~~LOC~~ SOLN
40.0000 mg | SUBCUTANEOUS | Status: DC
  Administered 2014-09-14 – 2014-09-15 (×2): 40 mg via SUBCUTANEOUS
  Filled 2014-09-14 (×3): qty 0.4

## 2014-09-14 MED ORDER — SODIUM CHLORIDE 0.9 % IV SOLN
250.0000 mL | INTRAVENOUS | Status: DC | PRN
Start: 2014-09-14 — End: 2014-09-16

## 2014-09-14 NOTE — H&P (Addendum)
[facility] History and Physical  Olivia Acosta TIR:[id number] DOB: 1961/03/17 DOA: 09/14/2014   PCP: Kathlene November, MD    Chief Complaint: flu like symptoms  HPI: Olivia Acosta is a 54 y.o. female with PMH of breast CA curently udergoing chemo- last treatment was this Thursday. She began having nasal drainage prior to the chemo on Wednesday. She then developed a sore throat, cough with green sputum, body aches and sinus congestion but no sinus pain. She had a fever of 101 at home at 8 AM today. No shortness of breath or chest pain. She has a dry scratchy throat. Rapid strep screen is negative. CXR clear. She is being admitted for acute bronchitis in setting of leukopenia/ immune compromised state.    General: The patient denies anorexia, fever, weight loss Cardiac: Denies chest pain, syncope, palpitations, + pedal edema last week after walking 12 miles last weekend when she was out of town Respiratory: Denies  shortness of breath, wheezing- other per HPI GI: Denies severe , abdominal pain, nausea, vomiting, diarrhea and constipation- uses Nexium after chemo for indigestion/heartburn GU: Denies hematuria, incontinence, dysuria  Musculoskeletal: diffuse body aches today Skin: has fine bumps on her top of her hands for a week now- started when she was out of town- no erythema Neurologic: Denies focal weakness or numbness, change in vision Psychiatry: Denies depression or anxiety. Hematologic: + easy bruising or bleeding   Past Medical History  Diagnosis Date  . Breast cancer     Past Surgical History  Procedure Laterality Date  . Tonsillectomy and adenoidectomy  1974  . Stricture of the uretha  1975  . Tonsillectomy    . Salivary stone removal  09  . Breast reconstruction Bilateral 04/17/2014  . Complete mastectomy w/ sentinel node biopsy Bilateral 04/17/2014    NIPPLE SPARING RECONSTRUCTION  . Breast reconstruction with placement of tissue expander and flex hd (acellular  hydrated dermis) Bilateral 04/17/2014    Procedure: BILATERAL BREAST RECONSTRUCTION WITH PLACEMENT OF TISSUE EXPANDER AND FLEX HD (ACELLULAR HYDRATED DERMIS);  Surgeon: Crissie Reese, MD;  Location: [place];  Service: Plastics;  Laterality: Bilateral;  . Mastectomy w/ sentinel node biopsy Bilateral 04/17/2014    Procedure: BILATERAL NIPPLE SPARING MASTECTOMY WITH SENTINEL LYMPH NODE BIOPSY;  Surgeon: Excell Seltzer, MD;  Location: [place];  Service: General;  Laterality: Bilateral;  . Portacath placement Left 05/27/2014    Procedure: INSERTION PORT-A-CATH;  Surgeon: Excell Seltzer, MD;  Location: [place];  Service: General;  Laterality: Left;  . Port a cath revision Right 05/27/2014    Procedure: PORT A CATH REVISION;  Surgeon: Excell Seltzer, MD;  Location: [place];  Service: General;  Laterality: Right;    Social History: works in [employer]- does not smoke- occasionally has a beer Lives at home with one child    Allergies  Allergen Reactions  . Penicillins Hives    Family History  Problem Relation Age of Onset  . Cancer Father   . Cancer Maternal Grandmother   . Cancer Paternal Grandmother      Prior to Admission medications   Medication Sig Start Date End Date Taking? Authorizing Provider  acetaminophen (TYLENOL) 500 MG tablet Take 500 mg by mouth every 6 (six) hours as needed for fever.   Yes Historical Provider, MD  dexamethasone (DECADRON) 4 MG tablet Take 4 mg by mouth as directed.  06/05/14  Yes Historical Provider, MD  DM-Phenylephrine-Acetaminophen (ALKA-SELTZER PLS SINUS & COUGH PO) Take 1 tablet by mouth every  6 (six) hours as needed (congestion/cough).   Yes Historical Provider, MD  esomeprazole (NEXIUM) 20 MG capsule Take 1 capsule (20 mg total) by mouth daily at 12 noon. 07/10/14  Yes Nicholas Lose, MD  lidocaine-prilocaine (EMLA) cream Apply topically once.  06/05/14  Yes Historical Provider, MD  LORazepam (ATIVAN) 0.5 MG tablet  Take 0.5 mg by mouth every 6 (six) hours as needed for anxiety or sleep.  06/05/14  Yes Historical Provider, MD  valACYclovir (VALTREX) 500 MG tablet Take 500 mg by mouth daily.   Yes Historical Provider, MD     Physical Exam: Filed Vitals:   09/14/14 1140 09/14/14 1214 09/14/14 1256 09/14/14 1515  BP: 123/69  106/61 119/74  Pulse: 82  76 85  Temp: 98.9 F (37.2 C) 98.6 F (37 C) 98.4 F (36.9 C) 99 F (37.2 C)  TempSrc: Oral Oral Oral Oral  Resp: 17  20   SpO2: 100%  100% 100%     General: AAO x 3, no distress HEENT: Normocephalic and Atraumatic, Mucous membranes pink                PERRLA; EOM intact; No scleral icterus,                 Nares: Patent, Oropharynx: Clear, Fair Dentition                 Neck: FROM, no cervical lymphadenopathy, thyromegaly, carotid bruit or JVD;  Sinus congestion/ clear nasal discharge noted-  Breasts: deferred CHEST WALL: No tenderness  CHEST: Normal respiration, clear to auscultation bilaterally - hoarse cough HEART: Regular rate and rhythm; no murmurs rubs or gallops  BACK: No kyphosis or scoliosis; no CVA tenderness  GI: Positive Bowel Sounds, soft, non-tender; no masses, no organomegaly Rectal Exam: deferred MSK: No cyanosis, clubbing, or edema Genitalia: not examined  SKIN:  no rash or ulceration  CNS: Alert and Oriented x 4, Nonfocal exam, CN 2-12 intact  Labs on Admission:  Basic Metabolic Panel:  Recent Labs Lab 09/11/14 1343 09/14/14 1235  NA 142 138  K 3.7 3.5  CL  --  102  CO2 25 26  GLUCOSE 108 106*  BUN 15.4 14  CREATININE 0.7 0.68  CALCIUM 9.2 8.4   Liver Function Tests:  Recent Labs Lab 09/11/14 1343 09/14/14 1235  AST 32 27  ALT 35 25  ALKPHOS 80 57  BILITOT 0.40 0.5  PROT 6.8 6.2  ALBUMIN 4.0 3.7   No results for input(s): LIPASE, AMYLASE in the last 168 hours. No results for input(s): AMMONIA in the last 168 hours. CBC:  Recent Labs Lab 09/11/14 1343 09/14/14 1235  WBC 3.0* 2.7*  NEUTROABS  2.2 2.2  HGB 10.8* 9.6*  HCT 33.1* 28.5*  MCV 92.5 93.4  PLT 201 171   Cardiac Enzymes: No results for input(s): CKTOTAL, CKMB, CKMBINDEX, TROPONINI in the last 168 hours.  BNP (last 3 results) No results for input(s): BNP in the last 8760 hours.  ProBNP (last 3 results) No results for input(s): PROBNP in the last 8760 hours.  CBG: No results for input(s): GLUCAP in the last 168 hours.  Radiological Exams on Admission: Dg Chest 2 View  09/14/2014   CLINICAL DATA:  Productive cough on Thursday. Fever and congestion. Patient undergoing chemotherapy for breast cancer.  EXAM: CHEST  2 VIEW  COMPARISON:  None.  FINDINGS: Bilateral tissue expanders are present. RIGHT IJ power port. Tip of the power port is in the upper to mid SVC.  RIGHT axillary dissection clips.  Cardiopericardial silhouette within normal limits. Mediastinal contours normal. Trachea midline. No airspace disease or effusion.  IMPRESSION: 1. No active cardiopulmonary disease. 2. Postsurgical changes of the chest associated with given history of breast cancer.   Electronically Signed   By: Dereck Ligas M.D.   On: 09/14/2014 13:26      Assessment/Plan Principal Problem:   Fever/ acute bronchitis / flu like symptoms - coughing up green mucous - started on Vanc and Cefepime in ER- will add zithromax  -will f/u blood cultures- if negative, down grade antibiotics to possibly zithromax only - obtain influenza panel-  - states that Claritin D helps with body aches- will order - also give Afrin for sinus congestion, tylenol for fevers  Active Problems:   Breast cancer of lower-outer quadrant of right female breast - s/p b/l mastectomy - s/p chemo on Thurs with Taxol - follow with Dr Lindi Adie    Leukopenia - monitor for further leukopenia- not severely neutropenic yet  Severe Heartburn - only after chemo- takes Nexium for 3 days- today would be her third but she states she has no heartburn and wants to skip the dose  today   Consulted:   Code Status: full code  Family Communication:   DVT Prophylaxis:Lovenox  Time spent: 53 min  [place], MD [facility]  If 7PM-7AM, please contact night-coverage [email or web address] 09/14/2014, 3:31 PM

## 2014-09-14 NOTE — ED Notes (Signed)
Attempted iv stick and was unable , nurse using port.  klj

## 2014-09-14 NOTE — ED Provider Notes (Signed)
CSN: [id number]     Arrival date & time 09/14/14  1134 History   First MD Initiated Contact with Patient 09/14/14 1142     Chief Complaint  Patient presents with  . ca pt, fever, cough      HPI Pt was seen at 1205.  Per pt, c/o gradual onset and persistence of constant sore throat, runny/stuffy nose, sinus congestion, and cough for the past 3 to 4 days. Has been associated with generalized body aches and home fevers to "101." LD tylenol at 1000 PTA. Pt is currently receiving chemotherapy for breast CA with her LD 3 days ago. Denies rash, no CP/SOB, no N/V/D, no abd pain.     Past Medical History  Diagnosis Date  . Breast cancer    Past Surgical History  Procedure Laterality Date  . Tonsillectomy and adenoidectomy  1974  . Stricture of the uretha  1975  . Tonsillectomy    . Salivary stone removal  09  . Breast reconstruction Bilateral 04/17/2014  . Complete mastectomy w/ sentinel node biopsy Bilateral 04/17/2014    NIPPLE SPARING RECONSTRUCTION  . Breast reconstruction with placement of tissue expander and flex hd (acellular hydrated dermis) Bilateral 04/17/2014    Procedure: BILATERAL BREAST RECONSTRUCTION WITH PLACEMENT OF TISSUE EXPANDER AND FLEX HD (ACELLULAR HYDRATED DERMIS);  Surgeon: Crissie Reese, MD;  Location: [place];  Service: Plastics;  Laterality: Bilateral;  . Mastectomy w/ sentinel node biopsy Bilateral 04/17/2014    Procedure: BILATERAL NIPPLE SPARING MASTECTOMY WITH SENTINEL LYMPH NODE BIOPSY;  Surgeon: Excell Seltzer, MD;  Location: [place];  Service: General;  Laterality: Bilateral;  . Portacath placement Left 05/27/2014    Procedure: INSERTION PORT-A-CATH;  Surgeon: Excell Seltzer, MD;  Location: [place];  Service: General;  Laterality: Left;  . Port a cath revision Right 05/27/2014    Procedure: PORT A CATH REVISION;  Surgeon: Excell Seltzer, MD;  Location: [place];  Service: General;  Laterality: Right;   Family History   Problem Relation Age of Onset  . Cancer Father   . Cancer Maternal Grandmother   . Cancer Paternal Grandmother    History  Substance Use Topics  . Smoking status: Never Smoker   . Smokeless tobacco: Never Used  . Alcohol Use: Yes     Comment: occ wine   OB History    Gravida Para Term Preterm AB TAB SAB Ectopic Multiple Living   2 2              Obstetric Comments   Menarche age 89, G51, P62, Menopause age 26, BC x 6 years, No HRT     Review of Systems ROS: Statement: All systems negative except as marked or noted in the HPI; Constitutional: +fever and chills, generalized body aches. ; ; Eyes: Negative for eye pain, redness and discharge. ; ; ENMT: Negative for ear pain, hoarseness, +nasal congestion, rhinorrhea, sinus pressure and sore throat. ; ; Cardiovascular: Negative for chest pain, palpitations, diaphoresis, dyspnea and peripheral edema. ; ; Respiratory: +cough. Negative for wheezing and stridor. ; ; Gastrointestinal: Negative for nausea, vomiting, diarrhea, abdominal pain, blood in stool, hematemesis, jaundice and rectal bleeding. . ; ; Genitourinary: Negative for dysuria, flank pain and hematuria. ; ; Musculoskeletal: Negative for back pain and neck pain. Negative for swelling and trauma.; ; Skin: Negative for pruritus, rash, abrasions, blisters, bruising and skin lesion.; ; Neuro: Negative for headache, lightheadedness and neck stiffness. Negative for weakness, altered level of consciousness , altered mental  status, extremity weakness, paresthesias, involuntary movement, seizure and syncope.      Allergies  Penicillins  Home Medications   Prior to Admission medications   Medication Sig Start Date End Date Taking? Authorizing Provider  dexamethasone (DECADRON) 4 MG tablet  06/05/14   Historical Provider, MD  esomeprazole (NEXIUM) 20 MG capsule Take 1 capsule (20 mg total) by mouth daily at 12 noon. 07/10/14   Nicholas Lose, MD  lidocaine-prilocaine (EMLA) cream  06/05/14    Historical Provider, MD  LORazepam (ATIVAN) 0.5 MG tablet  06/05/14   Historical Provider, MD  ondansetron (ZOFRAN) 8 MG tablet  06/05/14   Historical Provider, MD  prochlorperazine (COMPAZINE) 10 MG tablet  06/05/14   Historical Provider, MD  valACYclovir (VALTREX) 500 MG tablet Take 500 mg by mouth daily.    Historical Provider, MD   BP 106/61 mmHg  Pulse 76  Temp(Src) 98.4 F (36.9 C) (Oral)  Resp 20  SpO2 100%  LMP 05/28/2011 Physical Exam  1210: Physical examination:  Nursing notes reviewed; Vital signs and O2 SAT reviewed;  Constitutional: Well developed, Well nourished, Well hydrated, In no acute distress; Head:  Normocephalic, atraumatic; Eyes: EOMI, PERRL, No scleral icterus; ENMT: TM's clear bilat. +edemetous nasal turbinates bilat with clear rhinorrhea. Mouth and pharynx without lesions. No tonsillar exudates. No intra-oral edema. No submandibular or sublingual edema. No hoarse voice, no drooling, no stridor. No pain with manipulation of larynx. No trismus. Mouth and pharynx normal, Mucous membranes moist; Neck: Supple, Full range of motion, No lymphadenopathy; Cardiovascular: Regular rate and rhythm, No gallop; Respiratory: Breath sounds clear & equal bilaterally, No wheezes.  Speaking full sentences with ease, Normal respiratory effort/excursion; Chest: Nontender, Movement normal; Abdomen: Soft, Nontender, Nondistended, Normal bowel sounds; Genitourinary: No CVA tenderness; Extremities: Pulses normal, No tenderness, No edema, No calf edema or asymmetry.; Neuro: AA&Ox3, Major CN grossly intact.  Speech clear. No gross focal motor or sensory deficits in extremities.; Skin: Color normal, Warm, Dry.   ED Course  Procedures     EKG Interpretation None      MDM  MDM Reviewed: previous chart, nursing note and vitals Reviewed previous: labs Interpretation: labs and x-ray     Results for orders placed or performed during the hospital encounter of 09/14/14  Rapid strep screen   Result Value Ref Range   Streptococcus, Group A Screen (Direct) NEGATIVE NEGATIVE  CBC WITH DIFFERENTIAL  Result Value Ref Range   WBC 2.7 (L) 4.0 - 10.5 K/uL   RBC 3.05 (L) 3.87 - 5.11 MIL/uL   Hemoglobin 9.6 (L) 12.0 - 15.0 g/dL   HCT 28.5 (L) 36.0 - 46.0 %   MCV 93.4 78.0 - 100.0 fL   MCH 31.5 26.0 - 34.0 pg   MCHC 33.7 30.0 - 36.0 g/dL   RDW 16.4 (H) 11.5 - 15.5 %   Platelets 171 150 - 400 K/uL   Neutrophils Relative % 81 (H) 43 - 77 %   Neutro Abs 2.2 1.7 - 7.7 K/uL   Lymphocytes Relative 8 (L) 12 - 46 %   Lymphs Abs 0.2 (L) 0.7 - 4.0 K/uL   Monocytes Relative 8 3 - 12 %   Monocytes Absolute 0.2 0.1 - 1.0 K/uL   Eosinophils Relative 3 0 - 5 %   Eosinophils Absolute 0.1 0.0 - 0.7 K/uL   Basophils Relative 0 0 - 1 %   Basophils Absolute 0.0 0.0 - 0.1 K/uL  Comprehensive metabolic panel  Result Value Ref Range   Sodium 138  135 - 145 mmol/L   Potassium 3.5 3.5 - 5.1 mmol/L   Chloride 102 96 - 112 mmol/L   CO2 26 19 - 32 mmol/L   Glucose, Bld 106 (H) 70 - 99 mg/dL   BUN 14 6 - 23 mg/dL   Creatinine, Ser 0.68 0.50 - 1.10 mg/dL   Calcium 8.4 8.4 - 10.5 mg/dL   Total Protein 6.2 6.0 - 8.3 g/dL   Albumin 3.7 3.5 - 5.2 g/dL   AST 27 0 - 37 U/L   ALT 25 0 - 35 U/L   Alkaline Phosphatase 57 39 - 117 U/L   Total Bilirubin 0.5 0.3 - 1.2 mg/dL   GFR calc non Af Amer >90 >90 mL/min   GFR calc Af Amer >90 >90 mL/min   Anion gap 10 5 - 15  Urinalysis with microscopic  Result Value Ref Range   Color, Urine YELLOW YELLOW   APPearance CLEAR CLEAR   Specific Gravity, Urine 1.003 (L) 1.005 - 1.030   pH 7.0 5.0 - 8.0   Glucose, UA NEGATIVE NEGATIVE mg/dL   Hgb urine dipstick NEGATIVE NEGATIVE   Bilirubin Urine NEGATIVE NEGATIVE   Ketones, ur NEGATIVE NEGATIVE mg/dL   Protein, ur NEGATIVE NEGATIVE mg/dL   Urobilinogen, UA 0.2 0.0 - 1.0 mg/dL   Nitrite NEGATIVE NEGATIVE   Leukocytes, UA NEGATIVE NEGATIVE  Lactic acid, plasma  Result Value Ref Range   Lactic Acid, Venous 1.2 0.5  - 2.0 mmol/L   Dg Chest 2 View 09/14/2014   CLINICAL DATA:  Productive cough on Thursday. Fever and congestion. Patient undergoing chemotherapy for breast cancer.  EXAM: CHEST  2 VIEW  COMPARISON:  None.  FINDINGS: Bilateral tissue expanders are present. RIGHT IJ power port. Tip of the power port is in the upper to mid SVC. RIGHT axillary dissection clips.  Cardiopericardial silhouette within normal limits. Mediastinal contours normal. Trachea midline. No airspace disease or effusion.  IMPRESSION: 1. No active cardiopulmonary disease. 2. Postsurgical changes of the chest associated with given history of breast cancer.   Electronically Signed   By: Dereck Ligas M.D.   On: 09/14/2014 13:26    1355:  Influenza, BCx2 and UC pending. Will start broad spectrum abx. Dx and testing d/w pt.  Questions answered.  Verb understanding, agreeable to admit.  T/C to [facility]. Wynelle Cleveland, case discussed, including:  HPI, pertinent PM/SHx, VS/PE, dx testing, ED course and treatment:  Agreeable to admit, requests to write temporary orders, obtain medical bed to team [facility].   Francine Graven, DO 09/17/14 1559

## 2014-09-14 NOTE — ED Notes (Signed)
Pt reports currently being treated for breast cancer, received chemo on Thursday 4/7, productive cough started on Thursday. Body aches 5/10. Fever 101 at home, took tylenol 650mg  at 1000.

## 2014-09-14 NOTE — ED Notes (Signed)
Pt requests emla cream to port.  Ordered this, called pharmacy and will apply, access and draw blood once ready.  Pt aware of plan.

## 2014-09-14 NOTE — Progress Notes (Signed)
ANTIBIOTIC CONSULT NOTE - INITIAL  Pharmacy Consult for Cefepime and Vancomycin Indication: Febrile neutropenia  Allergies  Allergen Reactions  . Penicillins Hives    Patient Measurements:   Wt=71 kg  Vital Signs: Temp: 98.4 F (36.9 C) (04/10 1256) Temp Source: Oral (04/10 1256) BP: 106/61 mmHg (04/10 1256) Pulse Rate: 76 (04/10 1256) Intake/Output from previous day:   Intake/Output from this shift:    Labs:  Recent Labs  09/14/14 1235  WBC 2.7*  HGB 9.6*  PLT 171  CREATININE 0.68   Estimated Creatinine Clearance: 80.9 mL/min (by C-G formula based on Cr of 0.68). No results for input(s): VANCOTROUGH, VANCOPEAK, VANCORANDOM, GENTTROUGH, GENTPEAK, GENTRANDOM, TOBRATROUGH, TOBRAPEAK, TOBRARND, AMIKACINPEAK, AMIKACINTROU, AMIKACIN in the last 72 hours.   Microbiology: Recent Results (from the past 720 hour(s))  Rapid strep screen     Status: None   Collection Time: 09/14/14  1:00 PM  Result Value Ref Range Status   Streptococcus, Group A Screen (Direct) NEGATIVE NEGATIVE Final    Comment: (NOTE) A Rapid Antigen test may result negative if the antigen level in the sample is below the detection level of this test. The FDA has not cleared this test as a stand-alone test therefore the rapid antigen negative result has reflexed to a Group A Strep culture.     Medical History: Past Medical History  Diagnosis Date  . Breast cancer     Medications:  Scheduled:  . vancomycin  750 mg Intravenous 3 times per day   Infusions:  . sodium chloride    . ceFEPime (MAXIPIME) IV    . sodium chloride     Assessment: 27 yoF with hx Breast Ca, received chemo on 4/7 now with productive cough, body aches and T=101. Cefepime and Vancomycin per Rx for Febrile neutropenia.   Goal of Therapy:  Vancomycin trough level 15-20 mcg/ml  Plan:   Cefepime 2gm IV q8h  Vancomycin 750mg  IV q8h  F/u SCr/cultures/levels as needed  Lawana Pai R 09/14/2014,1:59 PM

## 2014-09-15 ENCOUNTER — Telehealth: Payer: Self-pay | Admitting: *Deleted

## 2014-09-15 DIAGNOSIS — D72819 Decreased white blood cell count, unspecified: Secondary | ICD-10-CM

## 2014-09-15 DIAGNOSIS — J209 Acute bronchitis, unspecified: Principal | ICD-10-CM

## 2014-09-15 LAB — CBC
HCT: 28.5 % — ABNORMAL LOW (ref 36.0–46.0)
Hemoglobin: 9.2 g/dL — ABNORMAL LOW (ref 12.0–15.0)
MCH: 30.4 pg (ref 26.0–34.0)
MCHC: 32.3 g/dL (ref 30.0–36.0)
MCV: 94.1 fL (ref 78.0–100.0)
PLATELETS: 157 10*3/uL (ref 150–400)
RBC: 3.03 MIL/uL — ABNORMAL LOW (ref 3.87–5.11)
RDW: 16.5 % — AB (ref 11.5–15.5)
WBC: 2.5 10*3/uL — ABNORMAL LOW (ref 4.0–10.5)

## 2014-09-15 LAB — URINE CULTURE
Colony Count: NO GROWTH
Culture: NO GROWTH

## 2014-09-15 NOTE — Telephone Encounter (Signed)
Received telephone advice record from [facility], sent to scan. Patient currently admitted at [facility].

## 2014-09-15 NOTE — Progress Notes (Addendum)
[facility] Progress Note   James Lafalce OVZ:[id number] DOB: [birth year] DOA: 09/14/2014 PCP: Kathlene November, MD  Brief narrative: Olivia Acosta is a 54 y.o. female with PMH of breast CA curently udergoing chemo- last treatment with Taxol was this Thursday. She presented with cough with green sputum, sinus congestion, scratchy throat and fever of 101.    Subjective: Still coughing up green mucous. Sinus congestion somewhat improved. Tells me her left nostrils is open today. Drinking fluids well. No new symptoms.   Assessment/Plan: Principal Problem:   Fever/  Flu-like symptoms/ Acute bronchitis in setting of leukopenia (not neutropenic fever) -this may be a viral illness but as she is immunocompromised, have admitted and obtained cultures and started broad spectrum antibiotics - last fever was 100.5 at 5 AM today - she has a Port as well- blood cx neg so far - UA and U cx neg - CXR negative for pneumonia - influenza panel negative - rapid strep screen negative  - if all cultures negative tomorrow, recommend to stop all antibiotics except for coverage for acute bronchitis- using Zithromax to cover for acute bronchitis (to include pertussis coverage as well). - cont Claritin + Dextromethorphan which she states helps with her body aches  Active Problems:   Breast cancer of lower-outer quadrant of right female breast - outpt chemo per Dr Lindi Adie    Leukopenia - WBC count slightly down today - recheck tomorrow   Code Status: full code Family Communication:  Disposition Plan: home hopefully tomorrow DVT prophylaxis: lovenox Consultants: Procedures:  Antibiotics: Anti-infectives    Start     Dose/Rate Route Frequency Ordered Stop   09/15/14 1000  valACYclovir (VALTREX) tablet 500 mg     500 mg Oral Daily 09/14/14 1441     09/15/14 1000  azithromycin (ZITHROMAX) tablet 250 mg     250 mg Oral Daily 09/14/14 1542 09/19/14 0959   09/14/14 1545  azithromycin (ZITHROMAX) tablet  500 mg     500 mg Oral Daily 09/14/14 1542 09/14/14 1848   09/14/14 1430  vancomycin (VANCOCIN) IVPB 750 mg/150 ml premix     750 mg 150 mL/hr over 60 Minutes Intravenous 3 times per day 09/14/14 1353     09/14/14 1400  ceFEPIme (MAXIPIME) 2 g in dextrose 5 % 50 mL IVPB     2 g 100 mL/hr over 30 Minutes Intravenous 3 times per day 09/14/14 1352        Objective: Filed Weights   09/14/14 1641  Weight: 71.215 kg (157 lb)    Intake/Output Summary (Last 24 hours) at 09/15/14 1410 Last data filed at 09/15/14 1300  Gross per 24 hour  Intake 1253.8 ml  Output      0 ml  Net 1253.8 ml     Vitals Filed Vitals:   09/14/14 2300 09/15/14 0517 09/15/14 0616 09/15/14 1325  BP:  107/67  110/66  Pulse:  88  89  Temp: 99.8 F (37.7 C) 100.5 F (38.1 C) 98.9 F (37.2 C) 98.1 F (36.7 C)  TempSrc: Oral Oral Oral Oral  Resp:  16  18  Height:      Weight:      SpO2:  98%  100%    Exam:  General:  Pt is alert, not in acute distress  HEENT: No icterus, No thrush  Cardiovascular: regular rate and rhythm, S1/S2 No murmur  Respiratory: clear to auscultation bilaterally   Abdomen: Soft, +Bowel sounds, non tender, non distended, no guarding  MSK: No LE edema, cyanosis  or clubbing  Data Reviewed: Basic Metabolic Panel:  Recent Labs Lab 09/11/14 1343 09/14/14 1235  NA 142 138  K 3.7 3.5  CL  --  102  CO2 25 26  GLUCOSE 108 106*  BUN 15.4 14  CREATININE 0.7 0.68  CALCIUM 9.2 8.4   Liver Function Tests:  Recent Labs Lab 09/11/14 1343 09/14/14 1235  AST 32 27  ALT 35 25  ALKPHOS 80 57  BILITOT 0.40 0.5  PROT 6.8 6.2  ALBUMIN 4.0 3.7   No results for input(s): LIPASE, AMYLASE in the last 168 hours. No results for input(s): AMMONIA in the last 168 hours. CBC:  Recent Labs Lab 09/11/14 1343 09/14/14 1235 09/15/14 0534  WBC 3.0* 2.7* 2.5*  NEUTROABS 2.2 2.2  --   HGB 10.8* 9.6* 9.2*  HCT 33.1* 28.5* 28.5*  MCV 92.5 93.4 94.1  PLT 201 171 157   Cardiac  Enzymes: No results for input(s): CKTOTAL, CKMB, CKMBINDEX, TROPONINI in the last 168 hours. BNP (last 3 results) No results for input(s): BNP in the last 8760 hours.  ProBNP (last 3 results) No results for input(s): PROBNP in the last 8760 hours.  CBG: No results for input(s): GLUCAP in the last 168 hours.  Recent Results (from the past 240 hour(s))  Culture, blood (routine x 2)     Status: None (Preliminary result)   Collection Time: 09/14/14 12:27 PM  Result Value Ref Range Status   Specimen Description BLOOD LEFT ANTECUBITAL  Final   Special Requests BOTTLES DRAWN AEROBIC AND ANAEROBIC 5CC EACH  Final   Culture   Final           BLOOD CULTURE RECEIVED NO GROWTH TO DATE CULTURE WILL BE HELD FOR 5 DAYS BEFORE ISSUING A FINAL NEGATIVE REPORT Performed at [facility]    Report Status PENDING  Incomplete  Rapid strep screen     Status: None   Collection Time: 09/14/14  1:00 PM  Result Value Ref Range Status   Streptococcus, Group A Screen (Direct) NEGATIVE NEGATIVE Final    Comment: (NOTE) A Rapid Antigen test may result negative if the antigen level in the sample is below the detection level of this test. The FDA has not cleared this test as a stand-alone test therefore the rapid antigen negative result has reflexed to a Group A Strep culture.   Culture, blood (routine x 2)     Status: None (Preliminary result)   Collection Time: 09/14/14  2:36 PM  Result Value Ref Range Status   Specimen Description BLOOD R PORT  Final   Special Requests BOTTLES DRAWN AEROBIC AND ANAEROBIC 5CC EACH  Final   Culture   Final           BLOOD CULTURE RECEIVED NO GROWTH TO DATE CULTURE WILL BE HELD FOR 5 DAYS BEFORE ISSUING A FINAL NEGATIVE REPORT Performed at [facility]    Report Status PENDING  Incomplete     Studies:  Recent x-ray studies have been reviewed in detail by the Attending Physician  Scheduled Meds:  Scheduled Meds: . azithromycin  250 mg Oral Daily  .  ceFEPime (MAXIPIME) IV  2 g Intravenous 3 times per day  . dextromethorphan  15 mg Oral BID  . enoxaparin (LOVENOX) injection  40 mg Subcutaneous Q24H  . loratadine  10 mg Oral Daily  . sodium chloride  3 mL Intravenous Q12H  . valACYclovir  500 mg Oral Daily  . vancomycin  750 mg Intravenous 3  times per day   Continuous Infusions:   Time spent on care of this patient: 35 min   [place], MD 09/15/2014, 2:10 PM  LOS: 1 day   [facility] Office  [phone] Pager - Text Page per [email or web address]  If 7PM-7AM, please contact night-coverage [email or web address]

## 2014-09-16 LAB — CBC
HCT: 29.1 % — ABNORMAL LOW (ref 36.0–46.0)
Hemoglobin: 9.5 g/dL — ABNORMAL LOW (ref 12.0–15.0)
MCH: 30.7 pg (ref 26.0–34.0)
MCHC: 32.6 g/dL (ref 30.0–36.0)
MCV: 94.2 fL (ref 78.0–100.0)
Platelets: 165 K/uL (ref 150–400)
RBC: 3.09 MIL/uL — ABNORMAL LOW (ref 3.87–5.11)
RDW: 16 % — ABNORMAL HIGH (ref 11.5–15.5)
WBC: 2.1 K/uL — ABNORMAL LOW (ref 4.0–10.5)

## 2014-09-16 LAB — BASIC METABOLIC PANEL WITH GFR
Anion gap: 5 (ref 5–15)
BUN: 13 mg/dL (ref 6–23)
CO2: 27 mmol/L (ref 19–32)
Calcium: 8.3 mg/dL — ABNORMAL LOW (ref 8.4–10.5)
Chloride: 106 mmol/L (ref 96–112)
Creatinine, Ser: 0.52 mg/dL (ref 0.50–1.10)
GFR calc Af Amer: >90 mL/min
GFR calc non Af Amer: >90 mL/min
Glucose, Bld: 101 mg/dL — ABNORMAL HIGH (ref 70–99)
Potassium: 3.7 mmol/L (ref 3.5–5.1)
Sodium: 138 mmol/L (ref 135–145)

## 2014-09-16 LAB — CULTURE, GROUP A STREP: STREP A CULTURE: NEGATIVE

## 2014-09-16 MED ORDER — HEPARIN SOD (PORK) LOCK FLUSH 100 UNIT/ML IV SOLN
500.0000 [IU] | INTRAVENOUS | Status: DC | PRN
  Administered 2014-09-16: 500 [IU]
  Filled 2014-09-16: qty 5

## 2014-09-16 MED ORDER — DEXTROMETHORPHAN POLISTIREX 30 MG/5ML PO LQCR: 15.0000 mg | Freq: Two times a day (BID) | ORAL | Status: DC

## 2014-09-16 MED ORDER — AZITHROMYCIN 250 MG PO TABS: 250.0000 mg | ORAL_TABLET | Freq: Every day | ORAL | Status: DC

## 2014-09-16 MED ORDER — HEPARIN SOD (PORK) LOCK FLUSH 100 UNIT/ML IV SOLN: 500.0000 [IU] | INTRAVENOUS | Status: DC

## 2014-09-16 MED ORDER — LORAZEPAM 0.5 MG PO TABS: 0.5000 mg | ORAL_TABLET | Freq: Four times a day (QID) | ORAL | Status: DC | PRN

## 2014-09-16 NOTE — Discharge Instructions (Signed)
Cough, Adult  A cough is a reflex that helps clear your throat and airways. It can help heal the body or may be a reaction to an irritated airway. A cough may only last 2 or 3 weeks (acute) or may last more than 8 weeks (chronic).  CAUSES Acute cough:  Viral or bacterial infections. Chronic cough:  Infections.  Allergies.  Asthma.  Post-nasal drip.  Smoking.  Heartburn or acid reflux.  Some medicines.  Chronic lung problems (COPD).  Cancer. SYMPTOMS   Cough.  Fever.  Chest pain.  Increased breathing rate.  High-pitched whistling sound when breathing (wheezing).  Colored mucus that you cough up (sputum). TREATMENT   A bacterial cough may be treated with antibiotic medicine.  A viral cough must run its course and will not respond to antibiotics.  Your caregiver may recommend other treatments if you have a chronic cough. HOME CARE INSTRUCTIONS   Only take over-the-counter or prescription medicines for pain, discomfort, or fever as directed by your caregiver. Use cough suppressants only as directed by your caregiver.  Use a cold steam vaporizer or humidifier in your bedroom or home to help loosen secretions.  Sleep in a semi-upright position if your cough is worse at night.  Rest as needed.  Stop smoking if you smoke. SEEK IMMEDIATE MEDICAL CARE IF:   You have pus in your sputum.  Your cough starts to worsen.  You cannot control your cough with suppressants and are losing sleep.  You begin coughing up blood.  You have difficulty breathing.  You develop pain which is getting worse or is uncontrolled with medicine.  You have a fever. MAKE SURE YOU:   Understand these instructions.  Will watch your condition.  Will get help right away if you are not doing well or get worse. Document Released: 11/19/2010 Document Revised: 08/15/2011 Document Reviewed: 11/19/2010 ExitCare Patient Information 2015 [employer]. This information is not intended  to replace advice given to you by your health care provider. Make sure you discuss any questions you have with your health care provider.  

## 2014-09-16 NOTE — Discharge Summary (Signed)
Physician Discharge Summary  Olivia Acosta AES:[id number] DOB: [birth year] DOA: 09/14/2014  PCP: Kathlene November, MD  Admit date: 09/14/2014 Discharge date: 09/16/2014  Recommendations for Outpatient Follow-up:  1. Pt will need to follow up with PCP in 2-3 weeks post discharge 2. Please obtain BMP to evaluate electrolytes and kidney function 3. Please also check CBC to evaluate Hg and Hct levels 4. Continue Zithromax for 6 more days post discharge   Discharge Diagnoses:  Principal Problem:   Fever Active Problems:   Breast cancer of lower-outer quadrant of right female breast   Leukopenia   Flu-like symptoms   Acute bronchitis   Discharge Condition: Stable  Diet recommendation: Heart healthy diet discussed in details   History of present illness:  54 y.o. female with PMH of breast CA curently udergoing chemo- last treatment with Taxol was one week prior to this admission. She presented with cough with green sputum, sinus congestion, scratchy throat and fever of 101.    Assessment/Plan: Principal Problem:  Sepsis in pt with neutropenic fever, immunocompromised state - criteria met on admission with T 101.15F, HR 94, WBC 2, source ? Bronchitis vs URI - started on broad spectrum ABX initially and since she has done well and feels better, transition to Zithromax upon discharge to complete therapy  - she has a Port as well- blood cx neg so far - UA and U cx neg - CXR negative for pneumonia - influenza panel negative - rapid strep screen negative  - pt wants to go home   Active Problems:  Breast cancer of lower-outer quadrant of right female breast - outpt chemo per Dr Lindi Adie   Leukopenia - WBC improving    Code Status: full code Disposition Plan: home   Procedures/Studies: CXR 09/14/2014   No active cardiopulmonary disease. 2. Postsurgical changes of the chest associated with given history of breast cancer.     Consultations:  None   Antibiotics:  Zithromax for 6  more days post discharge   Discharge Exam: Filed Vitals:   09/16/14 0503  BP: 101/63  Pulse: 77  Temp: 98.2 F (36.8 C)  Resp: 18   Filed Vitals:   09/15/14 0616 09/15/14 1325 09/15/14 2142 09/16/14 0503  BP:  110/66 98/60 101/63  Pulse:  89 82 77  Temp: 98.9 F (37.2 C) 98.1 F (36.7 C) 99.3 F (37.4 C) 98.2 F (36.8 C)  TempSrc: Oral Oral Oral Oral  Resp:  $Remo'18 18 18  'pOjyx$ Height:      Weight:      SpO2:  100% 100% 98%    General: Pt is alert, follows commands appropriately, not in acute distress Cardiovascular: Regular rate and rhythm, S1/S2 +, no murmurs, no rubs, no gallops Respiratory: Clear to auscultation bilaterally, no wheezing, no crackles, no rhonchi Abdominal: Soft, non tender, non distended, bowel sounds +, no guarding Extremities: no edema, no cyanosis, pulses palpable bilaterally DP and PT Neuro: Grossly nonfocal  Discharge Instructions  Discharge Instructions    Diet - low sodium heart healthy    Complete by:  As directed      Increase activity slowly    Complete by:  As directed             Medication List    TAKE these medications        acetaminophen 500 MG tablet  Commonly known as:  TYLENOL  Take 500 mg by mouth every 6 (six) hours as needed for fever.     ALKA-SELTZER PLS SINUS &  COUGH PO  Take 1 tablet by mouth every 6 (six) hours as needed (congestion/cough).     azithromycin 250 MG tablet  Commonly known as:  ZITHROMAX  Take 1 tablet (250 mg total) by mouth daily.     dexamethasone 4 MG tablet  Commonly known as:  DECADRON  Take 4 mg by mouth as directed.     dextromethorphan 30 MG/5ML liquid  Commonly known as:  DELSYM  Take 2.5 mLs (15 mg total) by mouth 2 (two) times daily.     esomeprazole 20 MG capsule  Commonly known as:  NEXIUM  Take 1 capsule (20 mg total) by mouth daily at 12 noon.     lidocaine-prilocaine cream  Commonly known as:  EMLA  Apply topically once.     LORazepam 0.5 MG tablet  Commonly known as:   ATIVAN  Take 1 tablet (0.5 mg total) by mouth every 6 (six) hours as needed for anxiety or sleep.     valACYclovir 500 MG tablet  Commonly known as:  VALTREX  Take 500 mg by mouth daily.            Follow-up Information    Follow up with Kathlene November, MD.   Specialty:  Internal Medicine   Contact information:   [address] [place] [address] [phone]       Follow up with Faye Ramsay, MD.   Specialty:  Internal Medicine   Why:  call my cell phone [phone]   Contact information:   [address] [place] [address] [phone]        The results of significant diagnostics from this hospitalization (including imaging, microbiology, ancillary and laboratory) are listed below for reference.     Microbiology: Recent Results (from the past 240 hour(s))  Urine culture     Status: None   Collection Time: 09/14/14 11:50 AM  Result Value Ref Range Status   Specimen Description URINE, CLEAN CATCH  Final   Special Requests NONE  Final   Colony Count NO GROWTH Performed at [facility]   Final   Culture NO GROWTH Performed at [facility]   Final   Report Status 09/15/2014 FINAL  Final  Culture, blood (routine x 2)     Status: None (Preliminary result)   Collection Time: 09/14/14 12:27 PM  Result Value Ref Range Status   Specimen Description BLOOD LEFT ANTECUBITAL  Final   Special Requests BOTTLES DRAWN AEROBIC AND ANAEROBIC 5CC EACH  Final   Culture   Final           BLOOD CULTURE RECEIVED NO GROWTH TO DATE CULTURE WILL BE HELD FOR 5 DAYS BEFORE ISSUING A FINAL NEGATIVE REPORT Performed at [facility]    Report Status PENDING  Incomplete  Rapid strep screen     Status: None   Collection Time: 09/14/14  1:00 PM  Result Value Ref Range Status   Streptococcus, Group A Screen (Direct) NEGATIVE NEGATIVE Final    Comment: (NOTE) A Rapid Antigen test may result negative if the antigen level in  the sample is below the detection level of this test. The FDA has not cleared this test as a stand-alone test therefore the rapid antigen negative result has reflexed to a Group A Strep culture.   Culture, blood (routine x 2)     Status: None (Preliminary result)   Collection Time: 09/14/14  2:36 PM  Result Value Ref Range Status  Specimen Description BLOOD R PORT  Final   Special Requests BOTTLES DRAWN AEROBIC AND ANAEROBIC 5CC EACH  Final   Culture   Final           BLOOD CULTURE RECEIVED NO GROWTH TO DATE CULTURE WILL BE HELD FOR 5 DAYS BEFORE ISSUING A FINAL NEGATIVE REPORT Performed at [facility]    Report Status PENDING  Incomplete     Labs: Basic Metabolic Panel:  Recent Labs Lab 09/11/14 1343 09/14/14 1235 09/16/14 0512  NA 142 138 138  K 3.7 3.5 3.7  CL  --  102 106  CO2 $Re'25 26 27  'qzs$ GLUCOSE 108 106* 101*  BUN 15.$Remov'4 14 13  'DFsQfF$ CREATININE 0.7 0.68 0.52  CALCIUM 9.2 8.4 8.3*   Liver Function Tests:  Recent Labs Lab 09/11/14 1343 09/14/14 1235  AST 32 27  ALT 35 25  ALKPHOS 80 57  BILITOT 0.40 0.5  PROT 6.8 6.2  ALBUMIN 4.0 3.7   CBC:  Recent Labs Lab 09/11/14 1343 09/14/14 1235 09/15/14 0534 09/16/14 0512  WBC 3.0* 2.7* 2.5* 2.1*  NEUTROABS 2.2 2.2  --   --   HGB 10.8* 9.6* 9.2* 9.5*  HCT 33.1* 28.5* 28.5* 29.1*  MCV 92.5 93.4 94.1 94.2  PLT 201 171 157 165     SIGNED: Time coordinating discharge: 30 minutes  MAGICK-Hanifah Royse, MD  [facility] 09/16/2014, 9:32 AM Pager [phone]  If 7PM-7AM, please contact night-coverage [email or web address] Password [email or web address]

## 2014-09-17 ENCOUNTER — Telehealth: Payer: Self-pay | Admitting: *Deleted

## 2014-09-17 NOTE — Telephone Encounter (Signed)
Received call from patient stating she just got discharged from [facility] yesterday and that she is due for labs and chemo tomorrow at [facility]. Patient inquiring if Dr. Lindi Adie would like to delay this treatment "since my white blood cell count was low while I was in the hospital." Told patient that this information will be passed along to Dr. Lindi Adie and someone will call her back later today.

## 2014-09-17 NOTE — Telephone Encounter (Signed)
Discussed with Dr. Lindi Adie and patient advised to keep appts for tomorrow. Patient verbalized understanding.

## 2014-09-18 ENCOUNTER — Ambulatory Visit (HOSPITAL_BASED_OUTPATIENT_CLINIC_OR_DEPARTMENT_OTHER): Payer: BLUE CROSS/BLUE SHIELD

## 2014-09-18 ENCOUNTER — Other Ambulatory Visit (HOSPITAL_BASED_OUTPATIENT_CLINIC_OR_DEPARTMENT_OTHER): Payer: BLUE CROSS/BLUE SHIELD

## 2014-09-18 VITALS — BP 118/75 | HR 78 | Temp 98.2°F | Resp 18

## 2014-09-18 DIAGNOSIS — C50511 Malignant neoplasm of lower-outer quadrant of right female breast: Secondary | ICD-10-CM | POA: Diagnosis not present

## 2014-09-18 DIAGNOSIS — Z5111 Encounter for antineoplastic chemotherapy: Secondary | ICD-10-CM | POA: Diagnosis not present

## 2014-09-18 LAB — COMPREHENSIVE METABOLIC PANEL (CC13)
ALT: 25 U/L (ref 0–55)
AST: 27 U/L (ref 5–34)
Albumin: 3.5 g/dL (ref 3.5–5.0)
Alkaline Phosphatase: 70 U/L (ref 40–150)
Anion Gap: 9 mEq/L (ref 3–11)
BUN: 17.8 mg/dL (ref 7.0–26.0)
CHLORIDE: 107 meq/L (ref 98–109)
CO2: 26 mEq/L (ref 22–29)
Calcium: 8.8 mg/dL (ref 8.4–10.4)
Creatinine: 0.7 mg/dL (ref 0.6–1.1)
EGFR: >90 mL/min/{1.73_m2} (ref >90)
Glucose: 107 mg/dl (ref 70–140)
POTASSIUM: 3.6 meq/L (ref 3.5–5.1)
Sodium: 141 mEq/L (ref 136–145)
Total Bilirubin: 0.22 mg/dL (ref 0.20–1.20)
Total Protein: 6.5 g/dL (ref 6.4–8.3)

## 2014-09-18 LAB — CBC WITH DIFFERENTIAL/PLATELET
BASO%: 1 % (ref 0.0–2.0)
Basophils Absolute: 0 10*3/uL (ref 0.0–0.1)
EOS%: 5.8 % (ref 0.0–7.0)
Eosinophils Absolute: 0.2 10*3/uL (ref 0.0–0.5)
HEMATOCRIT: 30.8 % — AB (ref 34.8–46.6)
HGB: 10.2 g/dL — ABNORMAL LOW (ref 11.6–15.9)
LYMPH%: 19.8 % (ref 14.0–49.7)
MCH: 30.5 pg (ref 25.1–34.0)
MCHC: 33 g/dL (ref 31.5–36.0)
MCV: 92.3 fL (ref 79.5–101.0)
MONO#: 0.4 10*3/uL (ref 0.1–0.9)
MONO%: 12.2 % (ref 0.0–14.0)
NEUT#: 2.2 10*3/uL (ref 1.5–6.5)
NEUT%: 61.2 % (ref 38.4–76.8)
PLATELETS: 275 10*3/uL (ref 145–400)
RBC: 3.33 10*6/uL — ABNORMAL LOW (ref 3.70–5.45)
RDW: 17.4 % — ABNORMAL HIGH (ref 11.2–14.5)
WBC: 3.6 10*3/uL — ABNORMAL LOW (ref 3.9–10.3)
lymph#: 0.7 10*3/uL — ABNORMAL LOW (ref 0.9–3.3)

## 2014-09-18 MED ORDER — DEXTROSE 5 % IV SOLN
80.0000 mg/m2 | Freq: Once | INTRAVENOUS | Status: AC
  Administered 2014-09-18: 150 mg via INTRAVENOUS
  Filled 2014-09-18: qty 25

## 2014-09-18 MED ORDER — SODIUM CHLORIDE 0.9 % IV SOLN
Freq: Once | INTRAVENOUS | Status: AC
  Administered 2014-09-18: 15:00:00 via INTRAVENOUS
  Filled 2014-09-18: qty 4

## 2014-09-18 MED ORDER — DIPHENHYDRAMINE HCL 50 MG/ML IJ SOLN
INTRAMUSCULAR | Status: AC
  Filled 2014-09-18: qty 1

## 2014-09-18 MED ORDER — SODIUM CHLORIDE 0.9 % IJ SOLN
10.0000 mL | INTRAMUSCULAR | Status: DC | PRN
  Administered 2014-09-18: 10 mL
  Filled 2014-09-18: qty 10

## 2014-09-18 MED ORDER — FAMOTIDINE IN NACL 20-0.9 MG/50ML-% IV SOLN
20.0000 mg | Freq: Once | INTRAVENOUS | Status: AC
  Administered 2014-09-18: 20 mg via INTRAVENOUS

## 2014-09-18 MED ORDER — DIPHENHYDRAMINE HCL 50 MG/ML IJ SOLN
50.0000 mg | Freq: Once | INTRAMUSCULAR | Status: AC
  Administered 2014-09-18: 50 mg via INTRAVENOUS

## 2014-09-18 MED ORDER — FAMOTIDINE IN NACL 20-0.9 MG/50ML-% IV SOLN
INTRAVENOUS | Status: AC
  Filled 2014-09-18: qty 50

## 2014-09-18 MED ORDER — SODIUM CHLORIDE 0.9 % IV SOLN: Freq: Once | INTRAVENOUS | Status: DC

## 2014-09-18 MED ORDER — HEPARIN SOD (PORK) LOCK FLUSH 100 UNIT/ML IV SOLN
500.0000 [IU] | Freq: Once | INTRAVENOUS | Status: AC | PRN
  Administered 2014-09-18: 500 [IU]
  Filled 2014-09-18: qty 5

## 2014-09-18 NOTE — Patient Instructions (Signed)
[facility] Discharge Instructions for Patients Receiving Chemotherapy  Today you received the following chemotherapy agents taxol  To help prevent nausea and vomiting after your treatment, we encourage you to take your nausea medication as prescribed   If you develop nausea and vomiting that is not controlled by your nausea medication, call the clinic.   BELOW ARE SYMPTOMS THAT SHOULD BE REPORTED IMMEDIATELY:  *FEVER GREATER THAN 100.5 F  *CHILLS WITH OR WITHOUT FEVER  NAUSEA AND VOMITING THAT IS NOT CONTROLLED WITH YOUR NAUSEA MEDICATION  *UNUSUAL SHORTNESS OF BREATH  *UNUSUAL BRUISING OR BLEEDING  TENDERNESS IN MOUTH AND THROAT WITH OR WITHOUT PRESENCE OF ULCERS  *URINARY PROBLEMS  *BOWEL PROBLEMS  UNUSUAL RASH Items with * indicate a potential emergency and should be followed up as soon as possible.  Feel free to call the clinic you have any questions or concerns. The clinic phone number is [phone].  Please show the CHEMO ALERT CARD at check-in to the Emergency Department and triage nurse.   

## 2014-09-19 ENCOUNTER — Telehealth: Payer: Self-pay | Admitting: *Deleted

## 2014-09-19 NOTE — Telephone Encounter (Signed)
Patient called and moved her appts to the afternoon. Plus per desk RN patient only needs MD visit every other treatment. Patient has appts for Monday and will pick up schedule then.

## 2014-09-20 LAB — CULTURE, BLOOD (ROUTINE X 2)
CULTURE: NO GROWTH
Culture: NO GROWTH

## 2014-09-22 ENCOUNTER — Other Ambulatory Visit (HOSPITAL_BASED_OUTPATIENT_CLINIC_OR_DEPARTMENT_OTHER): Payer: BLUE CROSS/BLUE SHIELD

## 2014-09-22 ENCOUNTER — Ambulatory Visit (HOSPITAL_BASED_OUTPATIENT_CLINIC_OR_DEPARTMENT_OTHER): Payer: BLUE CROSS/BLUE SHIELD | Admitting: Hematology and Oncology

## 2014-09-22 ENCOUNTER — Other Ambulatory Visit: Payer: BLUE CROSS/BLUE SHIELD

## 2014-09-22 ENCOUNTER — Telehealth: Payer: Self-pay | Admitting: Hematology and Oncology

## 2014-09-22 VITALS — BP 108/73 | HR 78 | Temp 98.0°F | Resp 18 | Ht 67.0 in | Wt 158.6 lb

## 2014-09-22 DIAGNOSIS — Z170 Estrogen receptor positive status [ER+]: Secondary | ICD-10-CM | POA: Diagnosis not present

## 2014-09-22 DIAGNOSIS — D701 Agranulocytosis secondary to cancer chemotherapy: Secondary | ICD-10-CM | POA: Diagnosis not present

## 2014-09-22 DIAGNOSIS — C50511 Malignant neoplasm of lower-outer quadrant of right female breast: Secondary | ICD-10-CM

## 2014-09-22 DIAGNOSIS — Z9013 Acquired absence of bilateral breasts and nipples: Secondary | ICD-10-CM

## 2014-09-22 LAB — COMPREHENSIVE METABOLIC PANEL (CC13)
ALT: 20 U/L (ref 0–55)
AST: 22 U/L (ref 5–34)
Albumin: 3.6 g/dL (ref 3.5–5.0)
Alkaline Phosphatase: 71 U/L (ref 40–150)
Anion Gap: 12 mEq/L — ABNORMAL HIGH (ref 3–11)
BUN: 14.4 mg/dL (ref 7.0–26.0)
CO2: 25 mEq/L (ref 22–29)
Calcium: 9.2 mg/dL (ref 8.4–10.4)
Chloride: 105 mEq/L (ref 98–109)
Creatinine: 0.7 mg/dL (ref 0.6–1.1)
EGFR: >90 mL/min/{1.73_m2} (ref >90)
Glucose: 90 mg/dl (ref 70–140)
Potassium: 4.1 mEq/L (ref 3.5–5.1)
Sodium: 142 mEq/L (ref 136–145)
Total Bilirubin: 0.44 mg/dL (ref 0.20–1.20)
Total Protein: 6.5 g/dL (ref 6.4–8.3)

## 2014-09-22 LAB — CBC WITH DIFFERENTIAL/PLATELET
BASO%: 1.5 % (ref 0.0–2.0)
BASOS ABS: 0 10*3/uL (ref 0.0–0.1)
EOS%: 10.2 % — ABNORMAL HIGH (ref 0.0–7.0)
Eosinophils Absolute: 0.2 10*3/uL (ref 0.0–0.5)
HCT: 33.1 % — ABNORMAL LOW (ref 34.8–46.6)
HGB: 10.7 g/dL — ABNORMAL LOW (ref 11.6–15.9)
LYMPH%: 18.3 % (ref 14.0–49.7)
MCH: 30.1 pg (ref 25.1–34.0)
MCHC: 32.2 g/dL (ref 31.5–36.0)
MCV: 93.4 fL (ref 79.5–101.0)
MONO#: 0.2 10*3/uL (ref 0.1–0.9)
MONO%: 8.2 % (ref 0.0–14.0)
NEUT#: 1.2 10*3/uL — ABNORMAL LOW (ref 1.5–6.5)
NEUT%: 61.8 % (ref 38.4–76.8)
PLATELETS: 283 10*3/uL (ref 145–400)
RBC: 3.54 10*6/uL — AB (ref 3.70–5.45)
RDW: 17.3 % — ABNORMAL HIGH (ref 11.2–14.5)
WBC: 1.9 10*3/uL — AB (ref 3.9–10.3)
lymph#: 0.4 10*3/uL — ABNORMAL LOW (ref 0.9–3.3)

## 2014-09-22 NOTE — Telephone Encounter (Signed)
per pof ot sch pt appt-pt to get updated copy b4 leaving inf

## 2014-09-22 NOTE — Assessment & Plan Note (Signed)
Right breast invasive lobular cancer 5.5 cm multifocal disease with 6/12 lymph nodes positive T3, N2, M0 stage IIIa, ER 92%, PR 98%, HER-2 negative ratio 1.13, Ki-67 10% status post bilateral mastectomies on 04/17/2014  Treatment plan:Adriamycin and Cytoxan dose dense every 2 weeks x4 followed by Taxol x12  Completed 4 cycles of Adriamycin and Cytoxan Current treatment: Week 8/12 Taxol Counseled her extensively regarding Taxol side effects including neuropathy risks. If she does neuropathy we will switch her to Abraxane. We will also be monitoring of blood counts very closely.  Toxicities of chemotherapy: 1. Grade 3 neutropenia: Chemotherapy dose reduced with cycle 2, Adriamycin 50 mg/m and Cytoxan 500 mg/m 2. Headache related to Zofran: Resolved with discontinuing Zofran 3. Severe heartburn: prescribed Nexium 20 mg daily, Decadron discontinued 4. Small mouth sores 5. Fatigue grade 1 6. Bronchitis/laryngitis 08/14/2014: Resolved with azithromycin 7. Chemotherapy-induced anemia hemoglobin 10.6 being monitored 8. Allergies: Taking Claritin-D but still has bronchitis-like symptoms if it does not get better we might have to treat her with antibiotics 9. Hospitalization 09/14/2014 to 09/16/2014 for neutropenic fever treated with azithromycin  The patient's neutrophil count has been slowly trending downwards. I anticipate that in the future we may have to reduce the dosage of Taxol. She does not have any neuropathy. Return to clinic in 2 weeks for clinic follow-up in weekly for chemotherapy

## 2014-09-22 NOTE — Progress Notes (Signed)
Patient Care Team: Colon Branch, MD as PCP - General  DIAGNOSIS: Breast cancer of lower-outer quadrant of right female breast   Staging form: Breast, AJCC 7th Edition     Clinical: Stage IIA (T2(m), N0, M0) - Unsigned     Pathologic: Stage IIIA (T3, N2a, cM0) - Signed by Rulon Eisenmenger, MD on 06/12/2014   SUMMARY OF ONCOLOGIC HISTORY:   Breast cancer of lower-outer quadrant of right female breast   01/31/2014 Breast MRI Right breast mass 3.3 x 2.8 x 3.4 cm with several foci of subcentimeter enhancing nodules extending anteriorly to worsen nipple up to 5.4 cm anterior to the mass    04/17/2014 Surgery Bilateral mastectomies, left: Benign, right breast multifocal ILC grade 2 largest 5.5 cm, posterior margin focally positive, 2 SLN's positive with extracapsular extension + 4/10 axillary LN positive, ER 92%, PR 98%, HER-2 -1.1.3, Ki 67 10%   06/12/2014 -  Chemotherapy Adriamycin and Cytoxan dose dense every 2 weeks x4 followed by Taxol x12   09/14/2014 - 09/16/2014 Hospital Admission Neutropenic fever    CHIEF COMPLIANT: Recent upper respiratory infection and hospitalization  INTERVAL HISTORY: Olivia Acosta is a 54 year old with above-mentioned history of right breast cancer treated with bilateral mastectomies and is currently on adjuvant chemotherapy. She received cycle 7/12 of Taxol treatment last Thursday. She is here for a follow-up and reports that she is doing very well from chemotherapy standpoint. She was hospitalized for 2 days for upper respiratory tract infection with neutropenia and was treated with IV antibiotics and discharged 2 days later. She is finishing up her azithromycin. Denies any further fevers or chills. Her symptoms are markedly improved.  REVIEW OF SYSTEMS:   Constitutional: Denies fevers, chills or abnormal weight loss Eyes: Denies blurriness of vision Ears, nose, mouth, throat, and face: Denies mucositis or sore throat Respiratory: Denies cough, dyspnea or  wheezes Cardiovascular: Denies palpitation, chest discomfort or lower extremity swelling Gastrointestinal:  Denies nausea, heartburn or change in bowel habits Skin: Denies abnormal skin rashes Lymphatics: Denies new lymphadenopathy or easy bruising Neurological:Denies numbness, tingling or new weaknesses Behavioral/Psych: Mood is stable, no new changes  Breast:  denies any pain or lumps or nodules in either breasts All other systems were reviewed with the patient and are negative.  I have reviewed the past medical history, past surgical history, social history and family history with the patient and they are unchanged from previous note.  ALLERGIES:  is allergic to penicillins.  MEDICATIONS:  Current Outpatient Prescriptions  Medication Sig Dispense Refill  . acetaminophen (TYLENOL) 500 MG tablet Take 500 mg by mouth every 6 (six) hours as needed for fever.    Marland Kitchen azithromycin (ZITHROMAX) 250 MG tablet Take 1 tablet (250 mg total) by mouth daily. 6 each 0  . dexamethasone (DECADRON) 4 MG tablet Take 4 mg by mouth as directed.   1  . dextromethorphan (DELSYM) 30 MG/5ML liquid Take 2.5 mLs (15 mg total) by mouth 2 (two) times daily. 89 mL 0  . DM-Phenylephrine-Acetaminophen (ALKA-SELTZER PLS SINUS & COUGH PO) Take 1 tablet by mouth every 6 (six) hours as needed (congestion/cough).    . esomeprazole (NEXIUM) 20 MG capsule Take 1 capsule (20 mg total) by mouth daily at 12 noon. 30 capsule 1  . lidocaine-prilocaine (EMLA) cream Apply topically once.   3  . LORazepam (ATIVAN) 0.5 MG tablet Take 1 tablet (0.5 mg total) by mouth every 6 (six) hours as needed for anxiety or sleep. 30 tablet 0  .  valACYclovir (VALTREX) 500 MG tablet Take 500 mg by mouth daily.     No current facility-administered medications for this visit.    PHYSICAL EXAMINATION: ECOG PERFORMANCE STATUS: 1 - Symptomatic but completely ambulatory  Filed Vitals:   09/22/14 0847  BP: 108/73  Pulse: 78  Temp: 98 F (36.7 C)   Resp: 18   Filed Weights   09/22/14 0847  Weight: 158 lb 9.6 oz (71.94 kg)    GENERAL:alert, no distress and comfortable SKIN: skin color, texture, turgor are normal, no rashes or significant lesions EYES: normal, Conjunctiva are pink and non-injected, sclera clear OROPHARYNX:no exudate, no erythema and lips, buccal mucosa, and tongue normal  NECK: supple, thyroid normal size, non-tender, without nodularity LYMPH:  no palpable lymphadenopathy in the cervical, axillary or inguinal LUNGS: clear to auscultation and percussion with normal breathing effort HEART: regular rate & rhythm and no murmurs and no lower extremity edema ABDOMEN:abdomen soft, non-tender and normal bowel sounds Musculoskeletal:no cyanosis of digits and no clubbing  NEURO: alert & oriented x 3 with fluent speech, no focal motor/sensory deficits LABORATORY DATA:  I have reviewed the data as listed   Chemistry      Component Value Date/Time   NA 141 09/18/2014 1322   NA 138 09/16/2014 0512   K 3.6 09/18/2014 1322   K 3.7 09/16/2014 0512   CL 106 09/16/2014 0512   CO2 26 09/18/2014 1322   CO2 27 09/16/2014 0512   BUN 17.8 09/18/2014 1322   BUN 13 09/16/2014 0512   CREATININE 0.7 09/18/2014 1322   CREATININE 0.52 09/16/2014 0512      Component Value Date/Time   CALCIUM 8.8 09/18/2014 1322   CALCIUM 8.3* 09/16/2014 0512   ALKPHOS 70 09/18/2014 1322   ALKPHOS 57 09/14/2014 1235   AST 27 09/18/2014 1322   AST 27 09/14/2014 1235   ALT 25 09/18/2014 1322   ALT 25 09/14/2014 1235   BILITOT 0.22 09/18/2014 1322   BILITOT 0.5 09/14/2014 1235       Lab Results  Component Value Date   WBC 1.9* 09/22/2014   HGB 10.7* 09/22/2014   HCT 33.1* 09/22/2014   MCV 93.4 09/22/2014   PLT 283 09/22/2014   NEUTROABS 1.2* 09/22/2014    ASSESSMENT & PLAN:  Breast cancer of lower-outer quadrant of right female breast Right breast invasive lobular cancer 5.5 cm multifocal disease with 6/12 lymph nodes positive T3,  N2, M0 stage IIIa, ER 92%, PR 98%, HER-2 negative ratio 1.13, Ki-67 10% status post bilateral mastectomies on 04/17/2014  Treatment plan:Adriamycin and Cytoxan dose dense every 2 weeks x4 followed by Taxol x12  Completed 4 cycles of Adriamycin and Cytoxan Current treatment: Week 8/12 Taxol Counseled her extensively regarding Taxol side effects including neuropathy risks. If she does neuropathy we will switch her to Abraxane. We will also be monitoring of blood counts very closely.  Toxicities of chemotherapy: 1. Grade 3 neutropenia: Chemotherapy dose reduced with cycle 2, Adriamycin 50 mg/m and Cytoxan 500 mg/m 2. Headache related to Zofran: Resolved with discontinuing Zofran 3. Severe heartburn: prescribed Nexium 20 mg daily, Decadron discontinued 4. Small mouth sores 5. Fatigue grade 1 6. Bronchitis/laryngitis 08/14/2014: Resolved with azithromycin 7. Chemotherapy-induced anemia hemoglobin 10.6 being monitored 8. Allergies: Taking Claritin-D but still has bronchitis-like symptoms if it does not get better we might have to treat her with antibiotics 9. Hospitalization 09/14/2014 to 09/16/2014 for neutropenic fever treated with azithromycin 10 nail bed discomfort: I recommended that she apply coconut oil.  Patient has an [place] of 1200 today. She does not have any fevers. She is finishing her last dose of azithromycin. I anticipate her blood counts to recover I Thursday treatment. We will add blood work for Thursday.  Return to clinic as scheduled and continue with weekly chemotherapies on Thursdays.   No orders of the defined types were placed in this encounter.   The patient has a good understanding of the overall plan. she agrees with it. She will call with any problems that may develop before her next visit here.   Rulon Eisenmenger, MD

## 2014-09-25 ENCOUNTER — Ambulatory Visit: Payer: BLUE CROSS/BLUE SHIELD | Admitting: Hematology and Oncology

## 2014-09-25 ENCOUNTER — Ambulatory Visit (HOSPITAL_BASED_OUTPATIENT_CLINIC_OR_DEPARTMENT_OTHER): Payer: BLUE CROSS/BLUE SHIELD

## 2014-09-25 ENCOUNTER — Other Ambulatory Visit (HOSPITAL_BASED_OUTPATIENT_CLINIC_OR_DEPARTMENT_OTHER): Payer: BLUE CROSS/BLUE SHIELD

## 2014-09-25 ENCOUNTER — Other Ambulatory Visit: Payer: BLUE CROSS/BLUE SHIELD

## 2014-09-25 VITALS — BP 119/74 | HR 76 | Temp 97.6°F | Resp 18

## 2014-09-25 DIAGNOSIS — C50511 Malignant neoplasm of lower-outer quadrant of right female breast: Secondary | ICD-10-CM

## 2014-09-25 DIAGNOSIS — Z5111 Encounter for antineoplastic chemotherapy: Secondary | ICD-10-CM

## 2014-09-25 LAB — COMPREHENSIVE METABOLIC PANEL (CC13)
ALT: 19 U/L (ref 0–55)
ANION GAP: 13 meq/L — AB (ref 3–11)
AST: 21 U/L (ref 5–34)
Albumin: 3.7 g/dL (ref 3.5–5.0)
Alkaline Phosphatase: 80 U/L (ref 40–150)
BUN: 17.1 mg/dL (ref 7.0–26.0)
CALCIUM: 8.9 mg/dL (ref 8.4–10.4)
CHLORIDE: 107 meq/L (ref 98–109)
CO2: 21 meq/L — AB (ref 22–29)
Creatinine: 0.7 mg/dL (ref 0.6–1.1)
EGFR: >90 mL/min/{1.73_m2} (ref >90)
Glucose: 109 mg/dl (ref 70–140)
Potassium: 4 mEq/L (ref 3.5–5.1)
Sodium: 141 mEq/L (ref 136–145)
Total Bilirubin: <0.2 mg/dL (ref 0.20–1.20)
Total Protein: 6.7 g/dL (ref 6.4–8.3)

## 2014-09-25 LAB — CBC WITH DIFFERENTIAL/PLATELET
BASO%: 1 % (ref 0.0–2.0)
Basophils Absolute: 0 10*3/uL (ref 0.0–0.1)
EOS%: 8.1 % — AB (ref 0.0–7.0)
Eosinophils Absolute: 0.3 10*3/uL (ref 0.0–0.5)
HCT: 32 % — ABNORMAL LOW (ref 34.8–46.6)
HEMOGLOBIN: 10.7 g/dL — AB (ref 11.6–15.9)
LYMPH%: 14.9 % (ref 14.0–49.7)
MCH: 31.3 pg (ref 25.1–34.0)
MCHC: 33.4 g/dL (ref 31.5–36.0)
MCV: 93.6 fL (ref 79.5–101.0)
MONO#: 0.3 10*3/uL (ref 0.1–0.9)
MONO%: 8.9 % (ref 0.0–14.0)
NEUT%: 67.1 % (ref 38.4–76.8)
NEUTROS ABS: 2.6 10*3/uL (ref 1.5–6.5)
Platelets: 300 10*3/uL (ref 145–400)
RBC: 3.42 10*6/uL — AB (ref 3.70–5.45)
RDW: 15.5 % — AB (ref 11.2–14.5)
WBC: 3.8 10*3/uL — ABNORMAL LOW (ref 3.9–10.3)
lymph#: 0.6 10*3/uL — ABNORMAL LOW (ref 0.9–3.3)

## 2014-09-25 MED ORDER — SODIUM CHLORIDE 0.9 % IV SOLN
Freq: Once | INTRAVENOUS | Status: AC
  Administered 2014-09-25: 15:00:00 via INTRAVENOUS

## 2014-09-25 MED ORDER — HEPARIN SOD (PORK) LOCK FLUSH 100 UNIT/ML IV SOLN
500.0000 [IU] | Freq: Once | INTRAVENOUS | Status: AC | PRN
  Administered 2014-09-25: 500 [IU]
  Filled 2014-09-25: qty 5

## 2014-09-25 MED ORDER — SODIUM CHLORIDE 0.9 % IV SOLN
Freq: Once | INTRAVENOUS | Status: AC
  Administered 2014-09-25: 15:00:00 via INTRAVENOUS
  Filled 2014-09-25: qty 4

## 2014-09-25 MED ORDER — FAMOTIDINE IN NACL 20-0.9 MG/50ML-% IV SOLN
20.0000 mg | Freq: Once | INTRAVENOUS | Status: AC
  Administered 2014-09-25: 20 mg via INTRAVENOUS

## 2014-09-25 MED ORDER — SODIUM CHLORIDE 0.9 % IJ SOLN
10.0000 mL | INTRAMUSCULAR | Status: DC | PRN
  Administered 2014-09-25: 10 mL
  Filled 2014-09-25: qty 10

## 2014-09-25 MED ORDER — DIPHENHYDRAMINE HCL 50 MG/ML IJ SOLN
50.0000 mg | Freq: Once | INTRAMUSCULAR | Status: AC
  Administered 2014-09-25: 50 mg via INTRAVENOUS

## 2014-09-25 MED ORDER — PACLITAXEL CHEMO INJECTION 300 MG/50ML
80.0000 mg/m2 | Freq: Once | INTRAVENOUS | Status: AC
  Administered 2014-09-25: 150 mg via INTRAVENOUS
  Filled 2014-09-25: qty 25

## 2014-09-25 MED ORDER — FAMOTIDINE IN NACL 20-0.9 MG/50ML-% IV SOLN
INTRAVENOUS | Status: AC
  Filled 2014-09-25: qty 50

## 2014-09-25 MED ORDER — DIPHENHYDRAMINE HCL 50 MG/ML IJ SOLN
INTRAMUSCULAR | Status: AC
  Filled 2014-09-25: qty 1

## 2014-09-25 NOTE — Patient Instructions (Signed)
[facility] Discharge Instructions for Patients Receiving Chemotherapy  Today you received the following chemotherapy agents Taxol   To help prevent nausea and vomiting after your treatment, we encourage you to take your nausea medication as directed.   If you develop nausea and vomiting that is not controlled by your nausea medication, call the clinic.   BELOW ARE SYMPTOMS THAT SHOULD BE REPORTED IMMEDIATELY:  *FEVER GREATER THAN 100.5 F  *CHILLS WITH OR WITHOUT FEVER  NAUSEA AND VOMITING THAT IS NOT CONTROLLED WITH YOUR NAUSEA MEDICATION  *UNUSUAL SHORTNESS OF BREATH  *UNUSUAL BRUISING OR BLEEDING  TENDERNESS IN MOUTH AND THROAT WITH OR WITHOUT PRESENCE OF ULCERS  *URINARY PROBLEMS  *BOWEL PROBLEMS  UNUSUAL RASH Items with * indicate a potential emergency and should be followed up as soon as possible.  Feel free to call the clinic you have any questions or concerns. The clinic phone number is [phone].  Please show the CHEMO ALERT CARD at check-in to the Emergency Department and triage nurse.   

## 2014-10-02 ENCOUNTER — Other Ambulatory Visit (HOSPITAL_BASED_OUTPATIENT_CLINIC_OR_DEPARTMENT_OTHER): Payer: BLUE CROSS/BLUE SHIELD

## 2014-10-02 ENCOUNTER — Ambulatory Visit (HOSPITAL_BASED_OUTPATIENT_CLINIC_OR_DEPARTMENT_OTHER): Payer: BLUE CROSS/BLUE SHIELD

## 2014-10-02 ENCOUNTER — Ambulatory Visit: Payer: BLUE CROSS/BLUE SHIELD | Admitting: Hematology and Oncology

## 2014-10-02 ENCOUNTER — Other Ambulatory Visit: Payer: BLUE CROSS/BLUE SHIELD

## 2014-10-02 VITALS — BP 103/66 | HR 73 | Temp 97.0°F | Resp 16 | Ht 67.0 in

## 2014-10-02 DIAGNOSIS — D701 Agranulocytosis secondary to cancer chemotherapy: Secondary | ICD-10-CM | POA: Diagnosis not present

## 2014-10-02 DIAGNOSIS — C50511 Malignant neoplasm of lower-outer quadrant of right female breast: Secondary | ICD-10-CM

## 2014-10-02 DIAGNOSIS — Z5111 Encounter for antineoplastic chemotherapy: Secondary | ICD-10-CM | POA: Diagnosis not present

## 2014-10-02 LAB — COMPREHENSIVE METABOLIC PANEL (CC13)
ALT: 24 U/L (ref 0–55)
AST: 22 U/L (ref 5–34)
Albumin: 3.8 g/dL (ref 3.5–5.0)
Alkaline Phosphatase: 71 U/L (ref 40–150)
Anion Gap: 12 mEq/L — ABNORMAL HIGH (ref 3–11)
BILIRUBIN TOTAL: 0.27 mg/dL (ref 0.20–1.20)
BUN: 16.8 mg/dL (ref 7.0–26.0)
CO2: 25 mEq/L (ref 22–29)
Calcium: 8.8 mg/dL (ref 8.4–10.4)
Chloride: 105 mEq/L (ref 98–109)
Creatinine: 0.8 mg/dL (ref 0.6–1.1)
EGFR: 82 mL/min/{1.73_m2} — ABNORMAL LOW (ref >90)
Glucose: 94 mg/dl (ref 70–140)
POTASSIUM: 4.1 meq/L (ref 3.5–5.1)
Sodium: 142 mEq/L (ref 136–145)
Total Protein: 6.5 g/dL (ref 6.4–8.3)

## 2014-10-02 LAB — CBC WITH DIFFERENTIAL/PLATELET
BASO%: 2 % (ref 0.0–2.0)
Basophils Absolute: 0.1 10*3/uL (ref 0.0–0.1)
EOS%: 7.1 % — ABNORMAL HIGH (ref 0.0–7.0)
Eosinophils Absolute: 0.3 10*3/uL (ref 0.0–0.5)
HCT: 32.5 % — ABNORMAL LOW (ref 34.8–46.6)
HGB: 10.6 g/dL — ABNORMAL LOW (ref 11.6–15.9)
LYMPH#: 0.6 10*3/uL — AB (ref 0.9–3.3)
LYMPH%: 17.4 % (ref 14.0–49.7)
MCH: 30.7 pg (ref 25.1–34.0)
MCHC: 32.7 g/dL (ref 31.5–36.0)
MCV: 93.9 fL (ref 79.5–101.0)
MONO#: 0.4 10*3/uL (ref 0.1–0.9)
MONO%: 10.4 % (ref 0.0–14.0)
NEUT#: 2.3 10*3/uL (ref 1.5–6.5)
NEUT%: 63.1 % (ref 38.4–76.8)
Platelets: 275 10*3/uL (ref 145–400)
RBC: 3.46 10*6/uL — AB (ref 3.70–5.45)
RDW: 15.9 % — AB (ref 11.2–14.5)
WBC: 3.6 10*3/uL — ABNORMAL LOW (ref 3.9–10.3)

## 2014-10-02 MED ORDER — SODIUM CHLORIDE 0.9 % IJ SOLN
10.0000 mL | INTRAMUSCULAR | Status: DC | PRN
  Administered 2014-10-02: 10 mL
  Filled 2014-10-02: qty 10

## 2014-10-02 MED ORDER — DIPHENHYDRAMINE HCL 50 MG/ML IJ SOLN
50.0000 mg | Freq: Once | INTRAMUSCULAR | Status: AC
  Administered 2014-10-02: 50 mg via INTRAVENOUS

## 2014-10-02 MED ORDER — SODIUM CHLORIDE 0.9 % IV SOLN
Freq: Once | INTRAVENOUS | Status: AC
  Administered 2014-10-02: 16:00:00 via INTRAVENOUS
  Filled 2014-10-02: qty 4

## 2014-10-02 MED ORDER — DIPHENHYDRAMINE HCL 50 MG/ML IJ SOLN
INTRAMUSCULAR | Status: AC
  Filled 2014-10-02: qty 1

## 2014-10-02 MED ORDER — FAMOTIDINE IN NACL 20-0.9 MG/50ML-% IV SOLN
20.0000 mg | Freq: Once | INTRAVENOUS | Status: AC
  Administered 2014-10-02: 20 mg via INTRAVENOUS

## 2014-10-02 MED ORDER — SODIUM CHLORIDE 0.9 % IV SOLN
Freq: Once | INTRAVENOUS | Status: AC
  Administered 2014-10-02: 16:00:00 via INTRAVENOUS

## 2014-10-02 MED ORDER — DEXTROSE 5 % IV SOLN
80.0000 mg/m2 | Freq: Once | INTRAVENOUS | Status: AC
  Administered 2014-10-02: 150 mg via INTRAVENOUS
  Filled 2014-10-02: qty 25

## 2014-10-02 MED ORDER — HEPARIN SOD (PORK) LOCK FLUSH 100 UNIT/ML IV SOLN
500.0000 [IU] | Freq: Once | INTRAVENOUS | Status: AC | PRN
  Administered 2014-10-02: 500 [IU]
  Filled 2014-10-02: qty 5

## 2014-10-02 MED ORDER — FAMOTIDINE IN NACL 20-0.9 MG/50ML-% IV SOLN
INTRAVENOUS | Status: AC
  Filled 2014-10-02: qty 50

## 2014-10-02 NOTE — Patient Instructions (Signed)
[facility] Discharge Instructions for Patients Receiving Chemotherapy  Today you received the following chemotherapy agents:  Taxol  To help prevent nausea and vomiting after your treatment, we encourage you to take your nausea medication as prescribed.   If you develop nausea and vomiting that is not controlled by your nausea medication, call the clinic.   BELOW ARE SYMPTOMS THAT SHOULD BE REPORTED IMMEDIATELY:  *FEVER GREATER THAN 100.5 F  *CHILLS WITH OR WITHOUT FEVER  NAUSEA AND VOMITING THAT IS NOT CONTROLLED WITH YOUR NAUSEA MEDICATION  *UNUSUAL SHORTNESS OF BREATH  *UNUSUAL BRUISING OR BLEEDING  TENDERNESS IN MOUTH AND THROAT WITH OR WITHOUT PRESENCE OF ULCERS  *URINARY PROBLEMS  *BOWEL PROBLEMS  UNUSUAL RASH Items with * indicate a potential emergency and should be followed up as soon as possible.  Feel free to call the clinic you have any questions or concerns. The clinic phone number is [phone].  Please show the CHEMO ALERT CARD at check-in to the Emergency Department and triage nurse.   

## 2014-10-06 ENCOUNTER — Telehealth: Payer: Self-pay | Admitting: Hematology and Oncology

## 2014-10-06 NOTE — Telephone Encounter (Signed)
Called and left a message that per inbox message we can cancel her visit with  Nira Conn this week

## 2014-10-08 ENCOUNTER — Ambulatory Visit: Payer: BLUE CROSS/BLUE SHIELD | Admitting: Nurse Practitioner

## 2014-10-08 ENCOUNTER — Other Ambulatory Visit: Payer: BLUE CROSS/BLUE SHIELD

## 2014-10-09 ENCOUNTER — Other Ambulatory Visit: Payer: BLUE CROSS/BLUE SHIELD

## 2014-10-09 ENCOUNTER — Ambulatory Visit: Payer: BLUE CROSS/BLUE SHIELD | Admitting: Hematology and Oncology

## 2014-10-09 ENCOUNTER — Other Ambulatory Visit (HOSPITAL_BASED_OUTPATIENT_CLINIC_OR_DEPARTMENT_OTHER): Payer: BLUE CROSS/BLUE SHIELD

## 2014-10-09 ENCOUNTER — Ambulatory Visit (HOSPITAL_BASED_OUTPATIENT_CLINIC_OR_DEPARTMENT_OTHER): Payer: BLUE CROSS/BLUE SHIELD

## 2014-10-09 VITALS — BP 115/68 | HR 64 | Temp 97.9°F

## 2014-10-09 DIAGNOSIS — D701 Agranulocytosis secondary to cancer chemotherapy: Secondary | ICD-10-CM

## 2014-10-09 DIAGNOSIS — C50511 Malignant neoplasm of lower-outer quadrant of right female breast: Secondary | ICD-10-CM | POA: Diagnosis not present

## 2014-10-09 DIAGNOSIS — Z5111 Encounter for antineoplastic chemotherapy: Secondary | ICD-10-CM | POA: Diagnosis not present

## 2014-10-09 LAB — CBC WITH DIFFERENTIAL/PLATELET
BASO%: 1.2 % (ref 0.0–2.0)
Basophils Absolute: 0 10*3/uL (ref 0.0–0.1)
EOS%: 5.3 % (ref 0.0–7.0)
Eosinophils Absolute: 0.2 10*3/uL (ref 0.0–0.5)
HCT: 34.2 % — ABNORMAL LOW (ref 34.8–46.6)
HGB: 11.4 g/dL — ABNORMAL LOW (ref 11.6–15.9)
LYMPH%: 15.9 % (ref 14.0–49.7)
MCH: 31.8 pg (ref 25.1–34.0)
MCHC: 33.3 g/dL (ref 31.5–36.0)
MCV: 95.5 fL (ref 79.5–101.0)
MONO#: 0.3 10*3/uL (ref 0.1–0.9)
MONO%: 9.7 % (ref 0.0–14.0)
NEUT%: 67.9 % (ref 38.4–76.8)
NEUTROS ABS: 2.2 10*3/uL (ref 1.5–6.5)
Platelets: 213 10*3/uL (ref 145–400)
RBC: 3.58 10*6/uL — AB (ref 3.70–5.45)
RDW: 14.3 % (ref 11.2–14.5)
WBC: 3.2 10*3/uL — ABNORMAL LOW (ref 3.9–10.3)
lymph#: 0.5 10*3/uL — ABNORMAL LOW (ref 0.9–3.3)

## 2014-10-09 LAB — COMPREHENSIVE METABOLIC PANEL (CC13)
ALK PHOS: 60 U/L (ref 40–150)
ALT: 23 U/L (ref 0–55)
AST: 24 U/L (ref 5–34)
Albumin: 4 g/dL (ref 3.5–5.0)
Anion Gap: 15 mEq/L — ABNORMAL HIGH (ref 3–11)
BUN: 15.5 mg/dL (ref 7.0–26.0)
CO2: 24 mEq/L (ref 22–29)
Calcium: 9.2 mg/dL (ref 8.4–10.4)
Chloride: 105 mEq/L (ref 98–109)
Creatinine: 0.8 mg/dL (ref 0.6–1.1)
EGFR: 86 mL/min/{1.73_m2} — AB (ref >90)
GLUCOSE: 95 mg/dL (ref 70–140)
Potassium: 3.8 mEq/L (ref 3.5–5.1)
SODIUM: 144 meq/L (ref 136–145)
TOTAL PROTEIN: 6.8 g/dL (ref 6.4–8.3)
Total Bilirubin: 0.34 mg/dL (ref 0.20–1.20)

## 2014-10-09 MED ORDER — SODIUM CHLORIDE 0.9 % IV SOLN
Freq: Once | INTRAVENOUS | Status: AC
  Administered 2014-10-09: 16:00:00 via INTRAVENOUS

## 2014-10-09 MED ORDER — FAMOTIDINE IN NACL 20-0.9 MG/50ML-% IV SOLN
INTRAVENOUS | Status: AC
  Filled 2014-10-09: qty 50

## 2014-10-09 MED ORDER — DIPHENHYDRAMINE HCL 50 MG/ML IJ SOLN
50.0000 mg | Freq: Once | INTRAMUSCULAR | Status: AC
  Administered 2014-10-09: 50 mg via INTRAVENOUS

## 2014-10-09 MED ORDER — SODIUM CHLORIDE 0.9 % IJ SOLN
10.0000 mL | INTRAMUSCULAR | Status: DC | PRN
  Administered 2014-10-09: 10 mL
  Filled 2014-10-09: qty 10

## 2014-10-09 MED ORDER — SODIUM CHLORIDE 0.9 % IV SOLN
Freq: Once | INTRAVENOUS | Status: AC
  Administered 2014-10-09: 16:00:00 via INTRAVENOUS
  Filled 2014-10-09: qty 4

## 2014-10-09 MED ORDER — FAMOTIDINE IN NACL 20-0.9 MG/50ML-% IV SOLN
20.0000 mg | Freq: Once | INTRAVENOUS | Status: AC
  Administered 2014-10-09: 20 mg via INTRAVENOUS

## 2014-10-09 MED ORDER — PACLITAXEL CHEMO INJECTION 300 MG/50ML
80.0000 mg/m2 | Freq: Once | INTRAVENOUS | Status: AC
  Administered 2014-10-09: 150 mg via INTRAVENOUS
  Filled 2014-10-09: qty 25

## 2014-10-09 MED ORDER — DIPHENHYDRAMINE HCL 50 MG/ML IJ SOLN
INTRAMUSCULAR | Status: AC
  Filled 2014-10-09: qty 1

## 2014-10-09 MED ORDER — HEPARIN SOD (PORK) LOCK FLUSH 100 UNIT/ML IV SOLN
500.0000 [IU] | Freq: Once | INTRAVENOUS | Status: AC | PRN
  Administered 2014-10-09: 500 [IU]
  Filled 2014-10-09: qty 5

## 2014-10-09 NOTE — Patient Instructions (Signed)
[facility] Discharge Instructions for Patients Receiving Chemotherapy  Today you received the following chemotherapy agents:  Taxol  To help prevent nausea and vomiting after your treatment, we encourage you to take your nausea medication as prescribed.   If you develop nausea and vomiting that is not controlled by your nausea medication, call the clinic.   BELOW ARE SYMPTOMS THAT SHOULD BE REPORTED IMMEDIATELY:  *FEVER GREATER THAN 100.5 F  *CHILLS WITH OR WITHOUT FEVER  NAUSEA AND VOMITING THAT IS NOT CONTROLLED WITH YOUR NAUSEA MEDICATION  *UNUSUAL SHORTNESS OF BREATH  *UNUSUAL BRUISING OR BLEEDING  TENDERNESS IN MOUTH AND THROAT WITH OR WITHOUT PRESENCE OF ULCERS  *URINARY PROBLEMS  *BOWEL PROBLEMS  UNUSUAL RASH Items with * indicate a potential emergency and should be followed up as soon as possible.  Feel free to call the clinic you have any questions or concerns. The clinic phone number is [phone].  Please show the CHEMO ALERT CARD at check-in to the Emergency Department and triage nurse.   

## 2014-10-16 ENCOUNTER — Ambulatory Visit (HOSPITAL_BASED_OUTPATIENT_CLINIC_OR_DEPARTMENT_OTHER): Payer: BLUE CROSS/BLUE SHIELD

## 2014-10-16 VITALS — BP 114/70 | HR 69 | Temp 98.3°F | Resp 16

## 2014-10-16 DIAGNOSIS — Z5111 Encounter for antineoplastic chemotherapy: Secondary | ICD-10-CM

## 2014-10-16 DIAGNOSIS — C50511 Malignant neoplasm of lower-outer quadrant of right female breast: Secondary | ICD-10-CM

## 2014-10-16 LAB — COMPREHENSIVE METABOLIC PANEL (CC13)
ALBUMIN: 3.7 g/dL (ref 3.5–5.0)
ALK PHOS: 62 U/L (ref 40–150)
ALT: 18 U/L (ref 0–55)
AST: 22 U/L (ref 5–34)
Anion Gap: 12 mEq/L — ABNORMAL HIGH (ref 3–11)
BILIRUBIN TOTAL: 0.21 mg/dL (ref 0.20–1.20)
BUN: 15.3 mg/dL (ref 7.0–26.0)
CO2: 24 mEq/L (ref 22–29)
Calcium: 8.7 mg/dL (ref 8.4–10.4)
Chloride: 106 mEq/L (ref 98–109)
Creatinine: 0.8 mg/dL (ref 0.6–1.1)
EGFR: 81 mL/min/{1.73_m2} — AB (ref >90)
Glucose: 93 mg/dl (ref 70–140)
Potassium: 4.2 mEq/L (ref 3.5–5.1)
SODIUM: 142 meq/L (ref 136–145)
TOTAL PROTEIN: 6.2 g/dL — AB (ref 6.4–8.3)

## 2014-10-16 LAB — CBC WITH DIFFERENTIAL/PLATELET
BASO%: 2.1 % — ABNORMAL HIGH (ref 0.0–2.0)
Basophils Absolute: 0.1 10*3/uL (ref 0.0–0.1)
EOS ABS: 0.2 10*3/uL (ref 0.0–0.5)
EOS%: 4.5 % (ref 0.0–7.0)
HCT: 32.5 % — ABNORMAL LOW (ref 34.8–46.6)
HGB: 10.8 g/dL — ABNORMAL LOW (ref 11.6–15.9)
LYMPH#: 0.6 10*3/uL — AB (ref 0.9–3.3)
LYMPH%: 17.4 % (ref 14.0–49.7)
MCH: 31.1 pg (ref 25.1–34.0)
MCHC: 33.1 g/dL (ref 31.5–36.0)
MCV: 93.9 fL (ref 79.5–101.0)
MONO#: 0.4 10*3/uL (ref 0.1–0.9)
MONO%: 10.1 % (ref 0.0–14.0)
NEUT%: 65.9 % (ref 38.4–76.8)
NEUTROS ABS: 2.3 10*3/uL (ref 1.5–6.5)
Platelets: 235 10*3/uL (ref 145–400)
RBC: 3.46 10*6/uL — ABNORMAL LOW (ref 3.70–5.45)
RDW: 15 % — AB (ref 11.2–14.5)
WBC: 3.5 10*3/uL — ABNORMAL LOW (ref 3.9–10.3)

## 2014-10-16 MED ORDER — FAMOTIDINE IN NACL 20-0.9 MG/50ML-% IV SOLN
20.0000 mg | Freq: Once | INTRAVENOUS | Status: AC
  Administered 2014-10-16: 20 mg via INTRAVENOUS

## 2014-10-16 MED ORDER — PACLITAXEL CHEMO INJECTION 300 MG/50ML
80.0000 mg/m2 | Freq: Once | INTRAVENOUS | Status: AC
  Administered 2014-10-16: 150 mg via INTRAVENOUS
  Filled 2014-10-16: qty 25

## 2014-10-16 MED ORDER — SODIUM CHLORIDE 0.9 % IJ SOLN
10.0000 mL | INTRAMUSCULAR | Status: DC | PRN
  Administered 2014-10-16: 10 mL
  Filled 2014-10-16: qty 10

## 2014-10-16 MED ORDER — FAMOTIDINE IN NACL 20-0.9 MG/50ML-% IV SOLN
INTRAVENOUS | Status: AC
  Filled 2014-10-16: qty 50

## 2014-10-16 MED ORDER — DIPHENHYDRAMINE HCL 50 MG/ML IJ SOLN
50.0000 mg | Freq: Once | INTRAMUSCULAR | Status: AC
  Administered 2014-10-16: 50 mg via INTRAVENOUS

## 2014-10-16 MED ORDER — HEPARIN SOD (PORK) LOCK FLUSH 100 UNIT/ML IV SOLN
500.0000 [IU] | Freq: Once | INTRAVENOUS | Status: AC | PRN
  Administered 2014-10-16: 500 [IU]
  Filled 2014-10-16: qty 5

## 2014-10-16 MED ORDER — SODIUM CHLORIDE 0.9 % IV SOLN
Freq: Once | INTRAVENOUS | Status: AC
  Administered 2014-10-16: 15:00:00 via INTRAVENOUS

## 2014-10-16 MED ORDER — SODIUM CHLORIDE 0.9 % IV SOLN
Freq: Once | INTRAVENOUS | Status: AC
  Administered 2014-10-16: 16:00:00 via INTRAVENOUS
  Filled 2014-10-16: qty 4

## 2014-10-16 MED ORDER — DIPHENHYDRAMINE HCL 50 MG/ML IJ SOLN
INTRAMUSCULAR | Status: AC
  Filled 2014-10-16: qty 1

## 2014-10-16 NOTE — Patient Instructions (Addendum)
[facility] Discharge Instructions for Patients Receiving Chemotherapy  Today you received the following chemotherapy agents:  Taxol  To help prevent nausea and vomiting after your treatment, we encourage you to take your nausea medication as ordered per MD.   If you develop nausea and vomiting that is not controlled by your nausea medication, call the clinic.   BELOW ARE SYMPTOMS THAT SHOULD BE REPORTED IMMEDIATELY:  *FEVER GREATER THAN 100.5 F  *CHILLS WITH OR WITHOUT FEVER  NAUSEA AND VOMITING THAT IS NOT CONTROLLED WITH YOUR NAUSEA MEDICATION  *UNUSUAL SHORTNESS OF BREATH  *UNUSUAL BRUISING OR BLEEDING  TENDERNESS IN MOUTH AND THROAT WITH OR WITHOUT PRESENCE OF ULCERS  *URINARY PROBLEMS  *BOWEL PROBLEMS  UNUSUAL RASH Items with * indicate a potential emergency and should be followed up as soon as possible.  Feel free to call the clinic you have any questions or concerns. The clinic phone number is [phone].  Please show the CHEMO ALERT CARD at check-in to the Emergency Department and triage nurse.  Paclitaxel injection What is this medicine? PACLITAXEL (PAK li TAX el) is a chemotherapy drug. It targets fast dividing cells, like cancer cells, and causes these cells to die. This medicine is used to treat ovarian cancer, breast cancer, and other cancers. This medicine may be used for other purposes; ask your health care provider or pharmacist if you have questions. COMMON BRAND NAME(S): Onxol, Taxol What should I tell my health care provider before I take this medicine? They need to know if you have any of these conditions: -blood disorders -irregular heartbeat -infection (especially a virus infection such as chickenpox, cold sores, or herpes) -liver disease -previous or ongoing radiation therapy -an unusual or allergic reaction to paclitaxel, alcohol, polyoxyethylated castor oil, other chemotherapy agents, other medicines, foods, dyes, or  preservatives -pregnant or trying to get pregnant -breast-feeding How should I use this medicine? This drug is given as an infusion into a vein. It is administered in a hospital or clinic by a specially trained health care professional. Talk to your pediatrician regarding the use of this medicine in children. Special care may be needed. Overdosage: If you think you have taken too much of this medicine contact a poison control center or emergency room at once. NOTE: This medicine is only for you. Do not share this medicine with others. What if I miss a dose? It is important not to miss your dose. Call your doctor or health care professional if you are unable to keep an appointment. What may interact with this medicine? Do not take this medicine with any of the following medications: -disulfiram -metronidazole This medicine may also interact with the following medications: -cyclosporine -diazepam -ketoconazole -medicines to increase blood counts like filgrastim, pegfilgrastim, sargramostim -other chemotherapy drugs like cisplatin, doxorubicin, epirubicin, etoposide, teniposide, vincristine -quinidine -testosterone -vaccines -verapamil Talk to your doctor or health care professional before taking any of these medicines: -acetaminophen -aspirin -ibuprofen -ketoprofen -naproxen This list may not describe all possible interactions. Give your health care provider a list of all the medicines, herbs, non-prescription drugs, or dietary supplements you use. Also tell them if you smoke, drink alcohol, or use illegal drugs. Some items may interact with your medicine. What should I watch for while using this medicine? Your condition will be monitored carefully while you are receiving this medicine. You will need important blood work done while you are taking this medicine. This drug may make you feel generally unwell. This is not uncommon, as chemotherapy   can affect healthy cells as well as cancer  cells. Report any side effects. Continue your course of treatment even though you feel ill unless your doctor tells you to stop. In some cases, you may be given additional medicines to help with side effects. Follow all directions for their use. Call your doctor or health care professional for advice if you get a fever, chills or sore throat, or other symptoms of a cold or flu. Do not treat yourself. This drug decreases your body's ability to fight infections. Try to avoid being around people who are sick. This medicine may increase your risk to bruise or bleed. Call your doctor or health care professional if you notice any unusual bleeding. Be careful brushing and flossing your teeth or using a toothpick because you may get an infection or bleed more easily. If you have any dental work done, tell your dentist you are receiving this medicine. Avoid taking products that contain aspirin, acetaminophen, ibuprofen, naproxen, or ketoprofen unless instructed by your doctor. These medicines may hide a fever. Do not become pregnant while taking this medicine. Women should inform their doctor if they wish to become pregnant or think they might be pregnant. There is a potential for serious side effects to an unborn child. Talk to your health care professional or pharmacist for more information. Do not breast-feed an infant while taking this medicine. Men are advised not to father a child while receiving this medicine. What side effects may I notice from receiving this medicine? Side effects that you should report to your doctor or health care professional as soon as possible: -allergic reactions like skin rash, itching or hives, swelling of the face, lips, or tongue -low blood counts - This drug may decrease the number of white blood cells, red blood cells and platelets. You may be at increased risk for infections and bleeding. -signs of infection - fever or chills, cough, sore throat, pain or difficulty passing  urine -signs of decreased platelets or bleeding - bruising, pinpoint red spots on the skin, black, tarry stools, nosebleeds -signs of decreased red blood cells - unusually weak or tired, fainting spells, lightheadedness -breathing problems -chest pain -high or low blood pressure -mouth sores -nausea and vomiting -pain, swelling, redness or irritation at the injection site -pain, tingling, numbness in the hands or feet -slow or irregular heartbeat -swelling of the ankle, feet, hands Side effects that usually do not require medical attention (report to your doctor or health care professional if they continue or are bothersome): -bone pain -complete hair loss including hair on your head, underarms, pubic hair, eyebrows, and eyelashes -changes in the color of fingernails -diarrhea -loosening of the fingernails -loss of appetite -muscle or joint pain -red flush to skin -sweating This list may not describe all possible side effects. Call your doctor for medical advice about side effects. You may report side effects to FDA at 1-800-FDA-1088. Where should I keep my medicine? This drug is given in a hospital or clinic and will not be stored at home. NOTE: This sheet is a summary. It may not cover all possible information. If you have questions about this medicine, talk to your doctor, pharmacist, or health care provider.  2015, [employer]. (2012-07-16 16:41:21)  

## 2014-10-23 ENCOUNTER — Ambulatory Visit (HOSPITAL_BASED_OUTPATIENT_CLINIC_OR_DEPARTMENT_OTHER): Payer: BLUE CROSS/BLUE SHIELD | Admitting: Hematology and Oncology

## 2014-10-23 ENCOUNTER — Ambulatory Visit (HOSPITAL_BASED_OUTPATIENT_CLINIC_OR_DEPARTMENT_OTHER): Payer: BLUE CROSS/BLUE SHIELD

## 2014-10-23 ENCOUNTER — Other Ambulatory Visit (HOSPITAL_BASED_OUTPATIENT_CLINIC_OR_DEPARTMENT_OTHER): Payer: BLUE CROSS/BLUE SHIELD

## 2014-10-23 VITALS — BP 118/80 | HR 82 | Temp 97.9°F | Resp 18 | Ht 67.0 in | Wt 160.2 lb

## 2014-10-23 DIAGNOSIS — D701 Agranulocytosis secondary to cancer chemotherapy: Secondary | ICD-10-CM | POA: Diagnosis not present

## 2014-10-23 DIAGNOSIS — D6481 Anemia due to antineoplastic chemotherapy: Secondary | ICD-10-CM | POA: Diagnosis not present

## 2014-10-23 DIAGNOSIS — C50511 Malignant neoplasm of lower-outer quadrant of right female breast: Secondary | ICD-10-CM | POA: Diagnosis not present

## 2014-10-23 DIAGNOSIS — C773 Secondary and unspecified malignant neoplasm of axilla and upper limb lymph nodes: Secondary | ICD-10-CM

## 2014-10-23 DIAGNOSIS — Z170 Estrogen receptor positive status [ER+]: Secondary | ICD-10-CM

## 2014-10-23 DIAGNOSIS — R12 Heartburn: Secondary | ICD-10-CM

## 2014-10-23 DIAGNOSIS — Z5111 Encounter for antineoplastic chemotherapy: Secondary | ICD-10-CM

## 2014-10-23 LAB — CBC WITH DIFFERENTIAL/PLATELET
BASO%: 1.7 % (ref 0.0–2.0)
Basophils Absolute: 0.1 10*3/uL (ref 0.0–0.1)
EOS%: 4.4 % (ref 0.0–7.0)
Eosinophils Absolute: 0.2 10*3/uL (ref 0.0–0.5)
HCT: 33.8 % — ABNORMAL LOW (ref 34.8–46.6)
HGB: 11.2 g/dL — ABNORMAL LOW (ref 11.6–15.9)
LYMPH%: 14.6 % (ref 14.0–49.7)
MCH: 31.3 pg (ref 25.1–34.0)
MCHC: 33.2 g/dL (ref 31.5–36.0)
MCV: 94.2 fL (ref 79.5–101.0)
MONO#: 0.4 10*3/uL (ref 0.1–0.9)
MONO%: 9.8 % (ref 0.0–14.0)
NEUT%: 69.5 % (ref 38.4–76.8)
NEUTROS ABS: 2.8 10*3/uL (ref 1.5–6.5)
Platelets: 257 10*3/uL (ref 145–400)
RBC: 3.59 10*6/uL — AB (ref 3.70–5.45)
RDW: 14.8 % — AB (ref 11.2–14.5)
WBC: 4 10*3/uL (ref 3.9–10.3)
lymph#: 0.6 10*3/uL — ABNORMAL LOW (ref 0.9–3.3)

## 2014-10-23 LAB — COMPREHENSIVE METABOLIC PANEL (CC13)
ALT: 15 U/L (ref 0–55)
AST: 20 U/L (ref 5–34)
Albumin: 3.7 g/dL (ref 3.5–5.0)
Alkaline Phosphatase: 60 U/L (ref 40–150)
Anion Gap: 12 mEq/L — ABNORMAL HIGH (ref 3–11)
BUN: 15.4 mg/dL (ref 7.0–26.0)
CHLORIDE: 106 meq/L (ref 98–109)
CO2: 23 mEq/L (ref 22–29)
CREATININE: 0.8 mg/dL (ref 0.6–1.1)
Calcium: 8.6 mg/dL (ref 8.4–10.4)
EGFR: 87 mL/min/{1.73_m2} — ABNORMAL LOW (ref >90)
Glucose: 97 mg/dl (ref 70–140)
Potassium: 3.9 mEq/L (ref 3.5–5.1)
Sodium: 141 mEq/L (ref 136–145)
Total Bilirubin: 0.22 mg/dL (ref 0.20–1.20)
Total Protein: 6.3 g/dL — ABNORMAL LOW (ref 6.4–8.3)

## 2014-10-23 MED ORDER — FAMOTIDINE IN NACL 20-0.9 MG/50ML-% IV SOLN
INTRAVENOUS | Status: AC
  Filled 2014-10-23: qty 50

## 2014-10-23 MED ORDER — SODIUM CHLORIDE 0.9 % IV SOLN
Freq: Once | INTRAVENOUS | Status: AC
  Administered 2014-10-23: 16:00:00 via INTRAVENOUS

## 2014-10-23 MED ORDER — DIPHENHYDRAMINE HCL 50 MG/ML IJ SOLN
INTRAMUSCULAR | Status: AC
  Filled 2014-10-23: qty 1

## 2014-10-23 MED ORDER — DIPHENHYDRAMINE HCL 50 MG/ML IJ SOLN
50.0000 mg | Freq: Once | INTRAMUSCULAR | Status: AC
  Administered 2014-10-23: 50 mg via INTRAVENOUS

## 2014-10-23 MED ORDER — DEXTROSE 5 % IV SOLN
80.0000 mg/m2 | Freq: Once | INTRAVENOUS | Status: AC
  Administered 2014-10-23: 150 mg via INTRAVENOUS
  Filled 2014-10-23: qty 25

## 2014-10-23 MED ORDER — FAMOTIDINE IN NACL 20-0.9 MG/50ML-% IV SOLN
20.0000 mg | Freq: Once | INTRAVENOUS | Status: AC
  Administered 2014-10-23: 20 mg via INTRAVENOUS

## 2014-10-23 MED ORDER — SODIUM CHLORIDE 0.9 % IV SOLN
Freq: Once | INTRAVENOUS | Status: AC
  Administered 2014-10-23: 16:00:00 via INTRAVENOUS
  Filled 2014-10-23: qty 4

## 2014-10-23 MED ORDER — SODIUM CHLORIDE 0.9 % IJ SOLN
10.0000 mL | INTRAMUSCULAR | Status: DC | PRN
  Administered 2014-10-23: 10 mL
  Filled 2014-10-23: qty 10

## 2014-10-23 MED ORDER — HEPARIN SOD (PORK) LOCK FLUSH 100 UNIT/ML IV SOLN
500.0000 [IU] | Freq: Once | INTRAVENOUS | Status: AC | PRN
  Administered 2014-10-23: 500 [IU]
  Filled 2014-10-23: qty 5

## 2014-10-23 NOTE — Patient Instructions (Signed)
[facility] Discharge Instructions for Patients Receiving Chemotherapy  Today you received the following chemotherapy agents Taxol   To help prevent nausea and vomiting after your treatment, we encourage you to take your nausea medication as directed.   If you develop nausea and vomiting that is not controlled by your nausea medication, call the clinic.   BELOW ARE SYMPTOMS THAT SHOULD BE REPORTED IMMEDIATELY:  *FEVER GREATER THAN 100.5 F  *CHILLS WITH OR WITHOUT FEVER  NAUSEA AND VOMITING THAT IS NOT CONTROLLED WITH YOUR NAUSEA MEDICATION  *UNUSUAL SHORTNESS OF BREATH  *UNUSUAL BRUISING OR BLEEDING  TENDERNESS IN MOUTH AND THROAT WITH OR WITHOUT PRESENCE OF ULCERS  *URINARY PROBLEMS  *BOWEL PROBLEMS  UNUSUAL RASH Items with * indicate a potential emergency and should be followed up as soon as possible.  Feel free to call the clinic you have any questions or concerns. The clinic phone number is [phone].  Please show the CHEMO ALERT CARD at check-in to the Emergency Department and triage nurse.   

## 2014-10-23 NOTE — Assessment & Plan Note (Signed)
Right breast invasive lobular cancer 5.5 cm multifocal disease with 6/12 lymph nodes positive T3, N2, M0 stage IIIa, ER 92%, PR 98%, HER-2 negative ratio 1.13, Ki-67 10% status post bilateral mastectomies on 04/17/2014  Treatment plan:Adriamycin and Cytoxan dose dense every 2 weeks x4 followed by Taxol x12  Completed 4 cycles of Adriamycin and Cytoxan Current treatment: Week 12/12 Taxol Counseled her extensively regarding Taxol side effects including neuropathy risks. If she does neuropathy we will switch her to Abraxane. We will also be monitoring of blood counts very closely.  Toxicities of chemotherapy: 1. Grade 3 neutropenia: Chemotherapy dose reduced with cycle 2, Adriamycin 50 mg/m and Cytoxan 500 mg/m 2. Headache related to Zofran: Resolved with discontinuing Zofran 3. Severe heartburn: prescribed Nexium 20 mg daily, Decadron discontinued 4. Small mouth sores 5. Fatigue grade 1 6. Bronchitis/laryngitis 08/14/2014: Resolved with azithromycin 7. Chemotherapy-induced anemia hemoglobin 10.6 being monitored 8. Allergies: Taking Claritin-D but still has bronchitis-like symptoms if it does not get better we might have to treat her with antibiotics 9. Hospitalization 09/14/2014 to 09/16/2014 for neutropenic fever treated with azithromycin 10 nail bed discomfort: I recommended that she apply coconut oil.   Patient completed adjuvant chemotherapy with this last dosage of treatment. I recommended consultation with radiation oncology Return to clinic after that to start antiestrogen therapy

## 2014-10-23 NOTE — Progress Notes (Signed)
Ok to proceed with treatment based off CMET from 10/16/14 per Dr Lindi Adie.

## 2014-10-23 NOTE — Progress Notes (Signed)
Patient Care Team: Colon Branch, MD as PCP - General  DIAGNOSIS: Breast cancer of lower-outer quadrant of right female breast   Staging form: Breast, AJCC 7th Edition     Clinical: Stage IIA (T2(m), N0, M0) - Unsigned     Pathologic: Stage IIIA (T3, N2a, cM0) - Signed by Rulon Eisenmenger, MD on 06/12/2014   SUMMARY OF ONCOLOGIC HISTORY:   Breast cancer of lower-outer quadrant of right female breast   01/31/2014 Breast MRI Right breast mass 3.3 x 2.8 x 3.4 cm with several foci of subcentimeter enhancing nodules extending anteriorly to worsen nipple up to 5.4 cm anterior to the mass    04/17/2014 Surgery Bilateral mastectomies, left: Benign, right breast multifocal ILC grade 2 largest 5.5 cm, posterior margin focally positive, 2 SLN's positive with extracapsular extension + 4/10 axillary LN positive, ER 92%, PR 98%, HER-2 -1.1.3, Ki 67 10%   06/12/2014 -  Chemotherapy Adriamycin and Cytoxan dose dense every 2 weeks x4 followed by Taxol x12   09/14/2014 - 09/16/2014 Hospital Admission Neutropenic fever    CHIEF COMPLIANT: Week 12 Taxol  INTERVAL HISTORY: Olivia Acosta is a 54 year old with above-mentioned history of right breast cancer currently on adjuvant chemotherapy and today's a last Taxol treatment. Overall she is tolerated chemotherapy fairly well except for nail changes related to Taxol. Patient is very excited about completion of chemotherapy.  REVIEW OF SYSTEMS:   Constitutional: Denies fevers, chills or abnormal weight loss Eyes: Denies blurriness of vision Ears, nose, mouth, throat, and face: Denies mucositis or sore throat Respiratory: Denies cough, dyspnea or wheezes Cardiovascular: Denies palpitation, chest discomfort or lower extremity swelling Gastrointestinal:  Denies nausea, heartburn or change in bowel habits Skin: Denies abnormal skin rashes Lymphatics: Denies new lymphadenopathy or easy bruising Neurological:Denies numbness, tingling or new weaknesses Behavioral/Psych: Mood  is stable, no new changes  Breast: Bilateral mastectomies All other systems were reviewed with the patient and are negative.  I have reviewed the past medical history, past surgical history, social history and family history with the patient and they are unchanged from previous note.  ALLERGIES:  is allergic to penicillins.  MEDICATIONS:  Current Outpatient Prescriptions  Medication Sig Dispense Refill  . acetaminophen (TYLENOL) 500 MG tablet Take 500 mg by mouth every 6 (six) hours as needed for fever.    Marland Kitchen azithromycin (ZITHROMAX) 250 MG tablet Take 1 tablet (250 mg total) by mouth daily. 6 each 0  . dexamethasone (DECADRON) 4 MG tablet Take 4 mg by mouth as directed.   1  . dextromethorphan (DELSYM) 30 MG/5ML liquid Take 2.5 mLs (15 mg total) by mouth 2 (two) times daily. 89 mL 0  . DM-Phenylephrine-Acetaminophen (ALKA-SELTZER PLS SINUS & COUGH PO) Take 1 tablet by mouth every 6 (six) hours as needed (congestion/cough).    . esomeprazole (NEXIUM) 20 MG capsule Take 1 capsule (20 mg total) by mouth daily at 12 noon. 30 capsule 1  . lidocaine-prilocaine (EMLA) cream Apply topically once.   3  . LORazepam (ATIVAN) 0.5 MG tablet Take 1 tablet (0.5 mg total) by mouth every 6 (six) hours as needed for anxiety or sleep. 30 tablet 0  . valACYclovir (VALTREX) 500 MG tablet Take 500 mg by mouth daily.     No current facility-administered medications for this visit.    PHYSICAL EXAMINATION: ECOG PERFORMANCE STATUS: 1 - Symptomatic but completely ambulatory  There were no vitals filed for this visit. There were no vitals filed for this visit.  GENERAL:alert, no distress  and comfortable SKIN: skin color, texture, turgor are normal, no rashes or significant lesions EYES: normal, Conjunctiva are pink and non-injected, sclera clear OROPHARYNX:no exudate, no erythema and lips, buccal mucosa, and tongue normal  NECK: supple, thyroid normal size, non-tender, without nodularity LYMPH:  no palpable  lymphadenopathy in the cervical, axillary or inguinal LUNGS: clear to auscultation and percussion with normal breathing effort HEART: regular rate & rhythm and no murmurs and no lower extremity edema ABDOMEN:abdomen soft, non-tender and normal bowel sounds Musculoskeletal:no cyanosis of digits and no clubbing  NEURO: alert & oriented x 3 with fluent speech, no focal motor/sensory deficits Neil bruising  LABORATORY DATA:  I have reviewed the data as listed   Chemistry      Component Value Date/Time   NA 142 10/16/2014 1501   NA 138 09/16/2014 0512   K 4.2 10/16/2014 1501   K 3.7 09/16/2014 0512   CL 106 09/16/2014 0512   CO2 24 10/16/2014 1501   CO2 27 09/16/2014 0512   BUN 15.3 10/16/2014 1501   BUN 13 09/16/2014 0512   CREATININE 0.8 10/16/2014 1501   CREATININE 0.52 09/16/2014 0512      Component Value Date/Time   CALCIUM 8.7 10/16/2014 1501   CALCIUM 8.3* 09/16/2014 0512   ALKPHOS 62 10/16/2014 1501   ALKPHOS 57 09/14/2014 1235   AST 22 10/16/2014 1501   AST 27 09/14/2014 1235   ALT 18 10/16/2014 1501   ALT 25 09/14/2014 1235   BILITOT 0.21 10/16/2014 1501   BILITOT 0.5 09/14/2014 1235       Lab Results  Component Value Date   WBC 3.5* 10/16/2014   HGB 10.8* 10/16/2014   HCT 32.5* 10/16/2014   MCV 93.9 10/16/2014   PLT 235 10/16/2014   NEUTROABS 2.3 10/16/2014   ASSESSMENT & PLAN:  Breast cancer of lower-outer quadrant of right female breast Right breast invasive lobular cancer 5.5 cm multifocal disease with 6/12 lymph nodes positive T3, N2, M0 stage IIIa, ER 92%, PR 98%, HER-2 negative ratio 1.13, Ki-67 10% status post bilateral mastectomies on 04/17/2014  Treatment plan:Adriamycin and Cytoxan dose dense every 2 weeks x4 followed by Taxol x12  Completed 4 cycles of Adriamycin and Cytoxan Current treatment: Week 12/12 Taxol Counseled her extensively regarding Taxol side effects including neuropathy risks. If she does neuropathy we will switch her to  Abraxane. We will also be monitoring of blood counts very closely.  Toxicities of chemotherapy: 1. Grade 3 neutropenia: Chemotherapy dose reduced with cycle 2, Adriamycin 50 mg/m and Cytoxan 500 mg/m 2. Headache related to Zofran: Resolved with discontinuing Zofran 3. Severe heartburn: prescribed Nexium 20 mg daily, Decadron discontinued 4. Small mouth sores 5. Fatigue grade 1 6. Bronchitis/laryngitis 08/14/2014: Resolved with azithromycin 7. Chemotherapy-induced anemia hemoglobin 10.6 being monitored 8. Allergies: Taking Claritin-D but still has bronchitis-like symptoms if it does not get better we might have to treat her with antibiotics 9. Hospitalization 09/14/2014 to 09/16/2014 for neutropenic fever treated with azithromycin 10 nail bed discomfort: I recommended that she apply coconut oil.   Patient completed adjuvant chemotherapy with this last dosage of treatment. I recommended consultation with radiation oncology Return to clinic after that to start antiestrogen therapy     No orders of the defined types were placed in this encounter.   The patient has a good understanding of the overall plan. she agrees with it. she will call with any problems that may develop before the next visit here.   Sabas Sous, MD

## 2014-10-24 ENCOUNTER — Telehealth: Payer: Self-pay | Admitting: Hematology and Oncology

## 2014-10-24 NOTE — Telephone Encounter (Signed)
Spoke with patient and she is aware of her appointments °

## 2014-10-29 ENCOUNTER — Ambulatory Visit
Admission: RE | Admit: 2014-10-29 | Discharge: 2014-10-29 | Disposition: A | Discharge status: Home / Self Care | Payer: BLUE CROSS/BLUE SHIELD | Source: Ambulatory Visit | Attending: Radiation Oncology | Admitting: Radiation Oncology

## 2014-10-29 ENCOUNTER — Encounter: Payer: Self-pay | Admitting: Radiation Oncology

## 2014-10-29 ENCOUNTER — Ambulatory Visit: Payer: BLUE CROSS/BLUE SHIELD | Admitting: Radiation Oncology

## 2014-10-29 VITALS — BP 128/81 | HR 73 | Temp 98.3°F | Resp 12 | Wt 162.3 lb

## 2014-10-29 DIAGNOSIS — C50511 Malignant neoplasm of lower-outer quadrant of right female breast: Secondary | ICD-10-CM | POA: Diagnosis not present

## 2014-10-29 NOTE — Progress Notes (Signed)
Radiation Oncology         [phone] ________________________________  Name: Olivia Acosta MRN: [id number]  Date: 10/29/2014  DOB: 12-24-1960  Follow-Up Visit Note  Outpatient  CC: Kathlene November, MD  Nicholas Lose, MD  Diagnosis and Prior Radiotherapy:    ICD-9-CM ICD-10-CM   1. Breast cancer of lower-outer quadrant of right female breast 174.5 C50.511    Stage IIIA  pT3, pN2a, cM0 Right Breast LOQ Invasive Lobular Carcinoma, ER+ / PR+ / Her2neg, Grade II.  Narrative:  The patient returns today for routine follow-up.  She completed Adriamycin and Cytoxan dose dense every 2 weeks x4 followed by Taxol x12a week ago.  Doing well. She has bilateral tissue expanders and I have discussed possible deflation of the contralateral breast with Dr. Harlow Mares, to be done prior to simulation. Today I will estimate if this is necessary.                            ALLERGIES:  is allergic to penicillins.  Meds: Current Outpatient Prescriptions  Medication Sig Dispense Refill  . acetaminophen (TYLENOL) 500 MG tablet Take 500 mg by mouth every 6 (six) hours as needed for fever.    Marland Kitchen dexamethasone (DECADRON) 4 MG tablet Take 4 mg by mouth as directed.   1  . dextromethorphan (DELSYM) 30 MG/5ML liquid Take 2.5 mLs (15 mg total) by mouth 2 (two) times daily. (Patient not taking: Reported on 10/23/2014) 89 mL 0  . DM-Phenylephrine-Acetaminophen (ALKA-SELTZER PLS SINUS & COUGH PO) Take 1 tablet by mouth every 6 (six) hours as needed (congestion/cough).    . esomeprazole (NEXIUM) 20 MG capsule Take 1 capsule (20 mg total) by mouth daily at 12 noon. 30 capsule 1  . lidocaine-prilocaine (EMLA) cream Apply topically once.   3  . LORazepam (ATIVAN) 0.5 MG tablet Take 1 tablet (0.5 mg total) by mouth every 6 (six) hours as needed for anxiety or sleep. 30 tablet 0  . valACYclovir (VALTREX) 500 MG tablet Take 500 mg by mouth daily.     No current facility-administered medications for this encounter.    Physical  Findings: The patient is in no acute distress. Patient is alert and oriented.  weight is 162 lb 4.8 oz (73.619 kg). Her oral temperature is 98.3 F (36.8 C). Her blood pressure is 128/81 and her pulse is 73. Her respiration is 12 and oxygen saturation is 100%. bilateral reconstructed breasts - scars have healed.     Lab Findings: Lab Results  Component Value Date   WBC 4.0 10/23/2014   HGB 11.2* 10/23/2014   HCT 33.8* 10/23/2014   MCV 94.2 10/23/2014   PLT 257 10/23/2014    Radiographic Findings: No results found.  Impression/Plan:   It was a pleasure meeting the patient ago today. We discussed the risks, benefits, and side effects of radiotherapy to the right chest wall / reconstructed breast and regional nodes. We discussed that radiation would take approximately 7 weeks to complete.  I do not think that any deflation of the contralateral breast expander is necessary to treat the right reconstructed breast with tangents. We spoke about acute effects including skin irritation and fatigue as well as much less common late effects including lung and heart irritation and reconstruction complications. We spoke about the latest technology that is used to minimize the risk of late effects for breast cancer patients undergoing radiotherapy. No guarantees of treatment were given. The patient is enthusiastic about  proceeding with treatment. I look forward to participating in the patient's care. Consent has been signed. Simulation to be scheduled either right before or after an upcoming vacation the patient will have in early June.  Will start RT on June 9th or soon thereafter.  I spent 30 minutes face to face with the patient and more than 50% of that time was spent in counseling and/or coordination of care. _____________________________________   Eppie Gibson, MD

## 2014-10-29 NOTE — Progress Notes (Addendum)
Location of Breast Cancer: Right breast cancer, lower outer quadrant  Histology per Pathology Report:   Diagnosis 1. Soft tissue, biopsy, Left nipple - BENIGN FIBROFATTY SOFT TISSUE AND BENIGN BREAST PARENCHYMA. - NO TUMOR SEEN. 2. Breast, simple mastectomy, Left stitch, axillary tail - FIBROCYSTIC CHANGES WITH CALCIFICATIONS AND FOCAL USUAL DUCTAL HYPERPLASIA. - NO ATYPIA OR MALIGNANCY IDENTIFIED. 3. Breast, simple mastectomy, Right - MULTIFOCAL INVASIVE GRADE II LOBULAR CARCINOMA WITH LARGEST TUMOR FOCUS SPANNING 5.5 CM IN GREATEST DIMENSION. - LOBULAR CARCINOMA IN SITU. 1 of 5 Duplicate copy FINAL for Olivia Acosta, Olivia Acosta [phone]) Diagnosis(continued) - ASSOCIATED CALCIFICATIONS. - POSTERIOR MARGIN IS FOCALLY POSITIVE. - OTHER MARGINS ARE NEGATIVE. - SEE ONCOLOGY TEMPLATE. 4. Lymph node, sentinel, biopsy, Right #1 - ONE LYMPH NODE POSITIVE FOR METASTATIC LOBULAR CARCINOMA (1/1). - EXTRACAPSULAR EXTENSION IS IDENTIFIED. 5. Lymph node, sentinel, biopsy, Right #2 - ONE LYMPH NODE POSITIVE FOR METASTATIC LOBULAR CARCINOMA (1/1). - SEE COMMENT. 6. Soft tissue, biopsy, Right nipple - BENIGN BREAST TISSUE. - NO TUMOR SEEN. 7. Fatty tissue, Right axillary - BENIGN FAT. - NO ATYPIA OR TUMOR SEEN. 8. Lymph nodes, regional resection, Right axillary content - FOUR OF TEN LYMPH NODES POSITIVE FOR METASTATIC LOBULAR CARCINOMA (4/10). Microscopic Comment 3. BREAST,  1. Bilateral immediate breast reconstruction with tissue expander. 2. Distinct procedure is bilateral chest wall reconstruction with  acellular dermal matrix for inadequate muscle and reset of  inframammary fold.  Receptor Status: ER(92%), PR (98%), Her2-neu (No Amplification), Ki-67 (10%)  Per an 04/17/14 note by Dr. Excell Seltzer [name] recently Felt a lump in the outer aspect of her right breast on monthly self-exam. She states this felt about as big as a pencil eraser. She had had a negative  screening mammogram in March. Subsequent imaging included diagnostic mamogram showing A spiculated mass in the area of palpable abnormality at the 7 to 8:00 position of the right breast and ultrasound showing A hypoechoic suspicious mass measuring 2.5 x 1.6 x 1.8 cm at the 7:00 position 5 cm from the nipple  Past/Anticipated interventions by surgeon, if any: Excell Seltzer- Right Simple Mastectomy and Bilateral Breast Reconstruction   Past/Anticipated interventions by medical oncology, if any: Chemotherapy to start: 06/12/13  Completion date: 10/23/14  Pain issues, if any: 4-5 at night when sleeping on sides-reported as "pulling."  Due to tissue expanders.  Taking Tylenol to manage.  SAFETY ISSUES:  Prior radiation? NO  Pacemaker/ICD? NO  Possible current pregnancy?NO  Is the patient on methotrexate? NO  Current Complaints / other details:  She menarched at early age of 82 and went to menopause at age 55  G3, P2 She has received birth control pills for approximately 6 years.  She was never exposed to fertility medications or hormone replacement therapy.  She has no family history of Breast/GYN/GI cancer   Portacath placed 05/27/14 right chest. Reports tingling in hands and feet.        Jenene Slicker, RN 10/29/2014,10:39 AM

## 2014-11-03 ENCOUNTER — Encounter (HOSPITAL_COMMUNITY): Payer: Self-pay | Admitting: Emergency Medicine

## 2014-11-03 ENCOUNTER — Emergency Department (HOSPITAL_COMMUNITY): Payer: BLUE CROSS/BLUE SHIELD

## 2014-11-03 ENCOUNTER — Emergency Department (HOSPITAL_COMMUNITY)
Admission: EM | Admit: 2014-11-03 | Discharge: 2014-11-03 | Disposition: A | Discharge status: Home / Self Care | Payer: BLUE CROSS/BLUE SHIELD | Attending: Emergency Medicine | Admitting: Emergency Medicine

## 2014-11-03 DIAGNOSIS — Z880 Allergy status to penicillin: Secondary | ICD-10-CM | POA: Insufficient documentation

## 2014-11-03 DIAGNOSIS — Z79899 Other long term (current) drug therapy: Secondary | ICD-10-CM | POA: Insufficient documentation

## 2014-11-03 DIAGNOSIS — R609 Edema, unspecified: Secondary | ICD-10-CM

## 2014-11-03 DIAGNOSIS — R600 Localized edema: Secondary | ICD-10-CM | POA: Insufficient documentation

## 2014-11-03 DIAGNOSIS — Z853 Personal history of malignant neoplasm of breast: Secondary | ICD-10-CM | POA: Insufficient documentation

## 2014-11-03 DIAGNOSIS — R2243 Localized swelling, mass and lump, lower limb, bilateral: Secondary | ICD-10-CM | POA: Diagnosis present

## 2014-11-03 LAB — COMPREHENSIVE METABOLIC PANEL
ALT: 16 U/L (ref 14–54)
AST: 23 U/L (ref 15–41)
Albumin: 4 g/dL (ref 3.5–5.0)
Alkaline Phosphatase: 56 U/L (ref 38–126)
Anion gap: 9 (ref 5–15)
BUN: 17 mg/dL (ref 6–20)
CO2: 26 mmol/L (ref 22–32)
Calcium: 9.1 mg/dL (ref 8.9–10.3)
Chloride: 105 mmol/L (ref 101–111)
Creatinine, Ser: 0.72 mg/dL (ref 0.44–1.00)
GFR calc Af Amer: >60 mL/min (ref >60)
GFR calc non Af Amer: >60 mL/min (ref >60)
Glucose, Bld: 94 mg/dL (ref 65–99)
POTASSIUM: 3.9 mmol/L (ref 3.5–5.1)
Sodium: 140 mmol/L (ref 135–145)
Total Bilirubin: 0.2 mg/dL — ABNORMAL LOW (ref 0.3–1.2)
Total Protein: 6.6 g/dL (ref 6.5–8.1)

## 2014-11-03 LAB — CBC WITH DIFFERENTIAL/PLATELET
Basophils Absolute: 0 10*3/uL (ref 0.0–0.1)
Basophils Relative: 1 % (ref 0–1)
EOS PCT: 4 % (ref 0–5)
Eosinophils Absolute: 0.2 10*3/uL (ref 0.0–0.7)
HEMATOCRIT: 33.8 % — AB (ref 36.0–46.0)
HEMOGLOBIN: 11.1 g/dL — AB (ref 12.0–15.0)
Lymphocytes Relative: 18 % (ref 12–46)
Lymphs Abs: 0.8 10*3/uL (ref 0.7–4.0)
MCH: 30.3 pg (ref 26.0–34.0)
MCHC: 32.8 g/dL (ref 30.0–36.0)
MCV: 92.3 fL (ref 78.0–100.0)
MONOS PCT: 13 % — AB (ref 3–12)
Monocytes Absolute: 0.6 10*3/uL (ref 0.1–1.0)
Neutro Abs: 3 10*3/uL (ref 1.7–7.7)
Neutrophils Relative %: 64 % (ref 43–77)
Platelets: 238 10*3/uL (ref 150–400)
RBC: 3.66 MIL/uL — AB (ref 3.87–5.11)
RDW: 13.7 % (ref 11.5–15.5)
WBC: 4.6 10*3/uL (ref 4.0–10.5)

## 2014-11-03 LAB — BRAIN NATRIURETIC PEPTIDE: B Natriuretic Peptide: 43.9 pg/mL (ref 0.0–100.0)

## 2014-11-03 LAB — TROPONIN I

## 2014-11-03 MED ORDER — HYDROCHLOROTHIAZIDE 12.5 MG PO TABS: 12.5000 mg | ORAL_TABLET | Freq: Every day | ORAL | Status: DC

## 2014-11-03 NOTE — ED Notes (Signed)
Pt c/o bilateral ankle swelling since Friday night and woke up this morning with bilateral knee swelling today. Pt has hx of R breast cancer and had her final chemo treatment on May 19th. Pt denies N/V/D, abdominal pain, chest pain, SOB. Pt denies pain, except when bending the knees due to a tight feeling.

## 2014-11-03 NOTE — ED Provider Notes (Signed)
CSN: [id number]     Arrival date & time 11/03/14  1554 History   First MD Initiated Contact with Patient 11/03/14 1648     Chief Complaint  Patient presents with  . Cancer  . Leg Swelling     (Consider location/radiation/quality/duration/timing/severity/associated sxs/prior Treatment) The history is provided by the patient.   patient's had some swelling in her lower legs over the last couple days. States that she initially had some swelling on her feet that resolved with some elevation. Began after she has been walking a fair amount. States that yesterday she had a lot of work around the house and today she felt as if her knees were swollen. States her ankles are doing better now. States the swelling was like when she had children. She has a history of breast cancer and finished up her chemotherapy a week ago. No change in urination. No chest pain. No shortness of breath. No fevers. No trauma. She does not smoke. She is scheduled to go to [place] tomorrow.  Past Medical History  Diagnosis Date  . Breast cancer    Past Surgical History  Procedure Laterality Date  . Tonsillectomy and adenoidectomy  1974  . Stricture of the uretha  1975  . Tonsillectomy    . Salivary stone removal  09  . Breast reconstruction Bilateral 04/17/2014  . Complete mastectomy w/ sentinel node biopsy Bilateral 04/17/2014    NIPPLE SPARING RECONSTRUCTION  . Breast reconstruction with placement of tissue expander and flex hd (acellular hydrated dermis) Bilateral 04/17/2014    Procedure: BILATERAL BREAST RECONSTRUCTION WITH PLACEMENT OF TISSUE EXPANDER AND FLEX HD (ACELLULAR HYDRATED DERMIS);  Surgeon: Crissie Reese, MD;  Location: [facility];  Service: Plastics;  Laterality: Bilateral;  . Mastectomy w/ sentinel node biopsy Bilateral 04/17/2014    Procedure: BILATERAL NIPPLE SPARING MASTECTOMY WITH SENTINEL LYMPH NODE BIOPSY;  Surgeon: Excell Seltzer, MD;  Location: [facility];  Service: General;  Laterality: Bilateral;  .  Portacath placement Left 05/27/2014    Procedure: INSERTION PORT-A-CATH;  Surgeon: Excell Seltzer, MD;  Location: [facility];  Service: General;  Laterality: Left;  . Port a cath revision Right 05/27/2014    Procedure: PORT A CATH REVISION;  Surgeon: Excell Seltzer, MD;  Location: [facility];  Service: General;  Laterality: Right;   Family History  Problem Relation Age of Onset  . Cancer Father   . Cancer Maternal Grandmother   . Cancer Paternal Grandmother    History  Substance Use Topics  . Smoking status: Never Smoker   . Smokeless tobacco: Never Used  . Alcohol Use: Yes     Comment: occ wine   OB History    Gravida Para Term Preterm AB TAB SAB Ectopic Multiple Living   2 2              Obstetric Comments   Menarche age 29, G2, P81, Menopause age 73, BC x 6 years, No HRT     Review of Systems  Constitutional: Negative for activity change and appetite change.  Eyes: Negative for pain.  Respiratory: Negative for chest tightness and shortness of breath.   Cardiovascular: Positive for leg swelling. Negative for chest pain.  Gastrointestinal: Negative for nausea, vomiting, abdominal pain and diarrhea.  Genitourinary: Negative for flank pain.  Musculoskeletal: Negative for back pain and neck stiffness.  Skin: Negative for rash and wound.  Neurological: Negative for weakness, numbness and headaches.  Psychiatric/Behavioral: Negative for behavioral problems.      Allergies  Penicillins  Home Medications   Prior to Admission medications   Medication Sig Start Date End Date Taking? Authorizing Provider  acetaminophen (TYLENOL) 500 MG tablet Take 1,000 mg by mouth every 6 (six) hours as needed for moderate pain, fever or headache (headache and pain).    Yes Historical Provider, MD  esomeprazole (NEXIUM) 20 MG capsule Take 1 capsule (20 mg total) by mouth daily at 12 noon. Patient taking differently: Take 20 mg by mouth daily as needed  (heartburn).  07/10/14  Yes Nicholas Lose, MD  lidocaine-prilocaine (EMLA) cream Apply topically once.  06/05/14  Yes Historical Provider, MD  LORazepam (ATIVAN) 0.5 MG tablet Take 1 tablet (0.5 mg total) by mouth every 6 (six) hours as needed for anxiety or sleep. 09/16/14  Yes Theodis Blaze, MD  Multiple Vitamins-Minerals (MULTIVITAMIN & MINERAL PO) Take 1 tablet by mouth daily.   Yes Historical Provider, MD  valACYclovir (VALTREX) 500 MG tablet Take 500 mg by mouth daily.   Yes Historical Provider, MD  dexamethasone (DECADRON) 4 MG tablet Take 4 mg by mouth as directed.  06/05/14   Historical Provider, MD  dextromethorphan (DELSYM) 30 MG/5ML liquid Take 2.5 mLs (15 mg total) by mouth 2 (two) times daily. Patient not taking: Reported on 10/23/2014 09/16/14   Theodis Blaze, MD  hydrochlorothiazide (HYDRODIURIL) 12.5 MG tablet Take 1 tablet (12.5 mg total) by mouth daily. 11/03/14   Davonna Belling, MD   BP 101/64 mmHg  Pulse 76  Temp(Src) 97.9 F (36.6 C) (Oral)  Resp 16  SpO2 98%  LMP 05/28/2011 Physical Exam  Constitutional: She is oriented to person, place, and time. She appears well-developed and well-nourished.  HENT:  Head: Normocephalic and atraumatic.  Eyes: Conjunctivae are normal.  Neck: No JVD present.  Cardiovascular: Normal rate, regular rhythm and normal heart sounds.   No murmur heard. Pulmonary/Chest: Effort normal and breath sounds normal. No respiratory distress. She has no wheezes. She has no rales.  Abdominal: Soft. Bowel sounds are normal. She exhibits no distension. There is no tenderness. There is no rebound and no guarding.  Musculoskeletal: Normal range of motion. She exhibits edema.  Mild bilateral lower extremity pitting edema. Equal in both her legs. Strong distal pulse. No skin changes.  Neurological: She is alert and oriented to person, place, and time. No cranial nerve deficit.  Skin: Skin is warm and dry.  Psychiatric: She has a normal mood and affect. Her  speech is normal.  Nursing note and vitals reviewed.   ED Course  Procedures (including critical care time) Labs Review Labs Reviewed  COMPREHENSIVE METABOLIC PANEL - Abnormal; Notable for the following:    Total Bilirubin 0.2 (*)    All other components within normal limits  CBC WITH DIFFERENTIAL/PLATELET - Abnormal; Notable for the following:    RBC 3.66 (*)    Hemoglobin 11.1 (*)    HCT 33.8 (*)    Monocytes Relative 13 (*)    All other components within normal limits  TROPONIN I  BRAIN NATRIURETIC PEPTIDE    Imaging Review Dg Chest 2 View  11/03/2014   CLINICAL DATA:  Bilateral lower extremity swelling. History of breast cancer.  EXAM: CHEST  2 VIEW  COMPARISON:  09/14/2014  FINDINGS: Right IJ Port-A-Cath unchanged. Lungs are adequately inflated without consolidation or effusion. Cardiomediastinal silhouette and remainder of the exam is unchanged.  IMPRESSION: No active cardiopulmonary disease.   Electronically Signed   By: Marin Olp M.D.   On: 11/03/2014 17:30  EKG Interpretation   Date/Time:  Monday Nov 03 2014 17:37:08 EDT Ventricular Rate:  77 PR Interval:  165 QRS Duration: 83 QT Interval:  387 QTC Calculation: 438 R Axis:   18 Text Interpretation:  Sinus rhythm Low voltage, precordial leads Confirmed  by Alvino Chapel  MD, Shakala Marlatt [phone]) on 11/03/2014 5:49:26 PM      MDM   Final diagnoses:  Peripheral edema    Patient with peripheral edema. EKG reassuring. No CHF on x-ray. Has had chemotherapy. Will treat with small dose of HCTZ and follow-up as needed. Doubt pulmonary embolism or DVTs.    Davonna Belling, MD 11/04/14 [phone]

## 2014-11-03 NOTE — Discharge Instructions (Signed)
Peripheral Edema °You have swelling in your legs (peripheral edema). This swelling is due to excess accumulation of salt and water in your body. Edema may be a sign of heart, kidney or liver disease, or a side effect of a medication. It may also be due to problems in the leg veins. Elevating your legs and using special support stockings may be very helpful, if the cause of the swelling is due to poor venous circulation. Avoid long periods of standing, whatever the cause. °Treatment of edema depends on identifying the cause. Chips, pretzels, pickles and other salty foods should be avoided. Restricting salt in your diet is almost always needed. Water pills (diuretics) are often used to remove the excess salt and water from your body via urine. These medicines prevent the kidney from reabsorbing sodium. This increases urine flow. °Diuretic treatment may also result in lowering of potassium levels in your body. Potassium supplements may be needed if you have to use diuretics daily. Daily weights can help you keep track of your progress in clearing your edema. You should call your caregiver for follow up care as recommended. °SEEK IMMEDIATE MEDICAL CARE IF:  °· You have increased swelling, pain, redness, or heat in your legs. °· You develop shortness of breath, especially when lying down. °· You develop chest or abdominal pain, weakness, or fainting. °· You have a fever. °Document Released: 06/30/2004 Document Revised: 08/15/2011 Document Reviewed: 06/10/2009 °ExitCare® Patient Information ©2015 [employer]. This information is not intended to replace advice given to you by your health care provider. Make sure you discuss any questions you have with your health care provider. ° °

## 2014-11-04 ENCOUNTER — Telehealth: Payer: Self-pay

## 2014-11-04 NOTE — Telephone Encounter (Signed)
Call report rcvd from [place] 11/03/14.  Sent to scan.

## 2014-11-10 ENCOUNTER — Ambulatory Visit
Admission: RE | Admit: 2014-11-10 | Discharge: 2014-11-10 | Disposition: A | Discharge status: Home / Self Care | Payer: BLUE CROSS/BLUE SHIELD | Source: Ambulatory Visit | Attending: Radiation Oncology | Admitting: Radiation Oncology

## 2014-11-10 DIAGNOSIS — C50511 Malignant neoplasm of lower-outer quadrant of right female breast: Secondary | ICD-10-CM | POA: Diagnosis not present

## 2014-11-10 NOTE — Progress Notes (Signed)
  Radiation Oncology         [phone] ________________________________  Name: Olivia Acosta MRN: [id number]  Date: 11/10/2014  DOB: 01/28/1961  SIMULATION AND TREATMENT PLANNING NOTE    Outpatient  DIAGNOSIS:     ICD-9-CM ICD-10-CM   1. Breast cancer of lower-outer quadrant of right female breast 174.5 C50.511     NARRATIVE:  The patient was brought to the [facility].  Identity was confirmed.  All relevant records and images related to the planned course of therapy were reviewed.  The patient freely provided informed written consent to proceed with treatment after reviewing the details related to the planned course of therapy. The consent form was witnessed and verified by the simulation staff.    Then, the patient was set-up in a stable reproducible supine position for radiation therapy with her ipsilateral arm over her head, and her upper body secured in a custom-made Vac-lok device.  CT images were obtained.  Surface markings were placed.  The CT images were loaded into the planning software.    TREATMENT PLANNING NOTE: Treatment planning then occurred.  The radiation prescription was entered and confirmed.     A total of 5 medically necessary complex treatment devices were fabricated and supervised by me: 4 fields with MLCs for custom blocks to protect heart, and lungs;  and, a Vac-lok. I have requested : 3D Simulation  I have requested a DVH of the following structures: lungs, heart, esophagus, cord.    The patient will receive 50.4 Gy in 28 fractions to the right reconstructed breast and regional nodes; this will be followed by a boost.  Optical Surface Tracking Plan:  Since intensity modulated radiotherapy (IMRT) and 3D conformal radiation treatment methods are predicated on accurate and precise positioning for treatment, intrafraction motion monitoring is medically necessary to ensure accurate and safe treatment delivery. The ability to quantify intrafraction  motion without excessive ionizing radiation dose can only be performed with optical surface tracking. Accordingly, surface imaging offers the opportunity to obtain 3D measurements of patient position throughout IMRT and 3D treatments without excessive radiation exposure. I am ordering optical surface tracking for this patient's upcoming course of radiotherapy.  ________________________________   Reference:  Ursula Alert, J, et al. Surface imaging-based analysis of intrafraction motion for breast radiotherapy patients.Journal of [place], n. 6, nov. 2014. ISSN [id number].  Available at: <http://www.jacmp.org/index.php/jacmp/article/view/4957>.     -----------------------------------  Eppie Gibson, MD

## 2014-11-12 ENCOUNTER — Ambulatory Visit: Payer: BLUE CROSS/BLUE SHIELD

## 2014-11-12 ENCOUNTER — Ambulatory Visit: Payer: BLUE CROSS/BLUE SHIELD | Admitting: Radiation Oncology

## 2014-11-12 DIAGNOSIS — C50511 Malignant neoplasm of lower-outer quadrant of right female breast: Secondary | ICD-10-CM | POA: Diagnosis not present

## 2014-11-13 DIAGNOSIS — C50511 Malignant neoplasm of lower-outer quadrant of right female breast: Secondary | ICD-10-CM | POA: Diagnosis not present

## 2014-11-14 ENCOUNTER — Telehealth: Payer: Self-pay

## 2014-11-14 NOTE — Telephone Encounter (Signed)
Returned pt call re: neuropathy.  Started last week in her feet (approx 2 weeks after chemo complete).  Pt denies pain, reports it is constant and tingling, better at night.  Reports nail beds have turned brown.  Let pt know both are side effects of the chemo.  Pt also reports lower extremity swelling - throbbing when her ankles start to swell.  Let pt know if it worsens, she should contact us.  Advised pt she may lose her nails.  Advised patient to use caution when ambulating.  Pt voiced understanding.

## 2014-11-17 ENCOUNTER — Ambulatory Visit
Admission: RE | Admit: 2014-11-17 | Discharge: 2014-11-17 | Disposition: A | Discharge status: Home / Self Care | Payer: BLUE CROSS/BLUE SHIELD | Source: Ambulatory Visit | Attending: Radiation Oncology | Admitting: Radiation Oncology

## 2014-11-17 DIAGNOSIS — C50511 Malignant neoplasm of lower-outer quadrant of right female breast: Secondary | ICD-10-CM | POA: Diagnosis not present

## 2014-11-18 ENCOUNTER — Ambulatory Visit
Admission: RE | Admit: 2014-11-18 | Discharge: 2014-11-18 | Disposition: A | Discharge status: Home / Self Care | Payer: BLUE CROSS/BLUE SHIELD | Source: Ambulatory Visit | Attending: Radiation Oncology | Admitting: Radiation Oncology

## 2014-11-18 DIAGNOSIS — C50511 Malignant neoplasm of lower-outer quadrant of right female breast: Secondary | ICD-10-CM | POA: Diagnosis not present

## 2014-11-19 ENCOUNTER — Ambulatory Visit
Admission: RE | Admit: 2014-11-19 | Discharge: 2014-11-19 | Disposition: A | Discharge status: Home / Self Care | Payer: BLUE CROSS/BLUE SHIELD | Source: Ambulatory Visit | Attending: Radiation Oncology | Admitting: Radiation Oncology

## 2014-11-19 DIAGNOSIS — C50511 Malignant neoplasm of lower-outer quadrant of right female breast: Secondary | ICD-10-CM | POA: Diagnosis not present

## 2014-11-20 ENCOUNTER — Ambulatory Visit
Admission: RE | Admit: 2014-11-20 | Discharge: 2014-11-20 | Disposition: A | Discharge status: Home / Self Care | Payer: BLUE CROSS/BLUE SHIELD | Source: Ambulatory Visit | Attending: Radiation Oncology | Admitting: Radiation Oncology

## 2014-11-20 DIAGNOSIS — C50511 Malignant neoplasm of lower-outer quadrant of right female breast: Secondary | ICD-10-CM | POA: Diagnosis not present

## 2014-11-21 ENCOUNTER — Ambulatory Visit
Admission: RE | Admit: 2014-11-21 | Discharge: 2014-11-21 | Disposition: A | Discharge status: Home / Self Care | Payer: BLUE CROSS/BLUE SHIELD | Source: Ambulatory Visit | Attending: Radiation Oncology | Admitting: Radiation Oncology

## 2014-11-21 DIAGNOSIS — C50511 Malignant neoplasm of lower-outer quadrant of right female breast: Secondary | ICD-10-CM | POA: Diagnosis not present

## 2014-11-24 ENCOUNTER — Ambulatory Visit
Admission: RE | Admit: 2014-11-24 | Discharge: 2014-11-24 | Disposition: A | Discharge status: Home / Self Care | Payer: BLUE CROSS/BLUE SHIELD | Source: Ambulatory Visit | Attending: Radiation Oncology | Admitting: Radiation Oncology

## 2014-11-24 ENCOUNTER — Encounter: Payer: Self-pay | Admitting: Radiation Oncology

## 2014-11-24 VITALS — BP 106/75 | HR 69 | Temp 98.2°F | Ht 67.0 in | Wt 161.6 lb

## 2014-11-24 DIAGNOSIS — C50511 Malignant neoplasm of lower-outer quadrant of right female breast: Secondary | ICD-10-CM | POA: Diagnosis not present

## 2014-11-24 MED ORDER — RADIAPLEXRX EX GEL: Freq: Once | CUTANEOUS | Status: DC

## 2014-11-24 MED ORDER — ALRA NON-METALLIC DEODORANT (RAD-ONC): 1.0000 "application " | Freq: Once | TOPICAL | Status: DC

## 2014-11-24 NOTE — Addendum Note (Signed)
Encounter addended by: Benn Moulder, RN on: 11/24/2014  8:13 PM<BR>     Documentation filed: Orders, Dx Association

## 2014-11-24 NOTE — Progress Notes (Signed)
Olivia Acosta has received 5 fractions to her right breast and at this time no changes in the skin of her tx field.   Pt here for patient teaching.  Pt given Radiation and You booklet, skin care instructions, Alra deodorant and Radiaplex gel. Reviewed areas of pertinence such as fatigue, hair loss, skin changes, breast tenderness and breast swelling . Pt able to give teach back of to pat skin, use unscented/gentle soap and drink plenty of water,apply Radiaplex bid, avoid applying anything to skin within 4 hours of treatment, avoid wearing an under wire bra and to use an electric razor if they must shave. Pt demonstrated understanding and verbalizes understanding of information given and will contact nursing with any questions or concerns.  Teachback.  Informed that she is seen by Dr. Isidore Moos every Monday following her treatment, but if this changes she will be notified in advance.  Given this RN's card.

## 2014-11-24 NOTE — Progress Notes (Signed)
Weekly Management Note:  Site: Reconstructed right breast/chest wall and regional lymph nodes Current Dose:  900  cGy Projected Dose: 5040  cGy followed by right mastectomy scar boost  Narrative: The patient is seen today for routine under treatment assessment. CBCT/MVCT images/port films were reviewed. The chart was reviewed.   She is doing well and is without complaints today.  She was given Radioplex gel to use as needed.  Physical Examination:  Filed Vitals:   11/24/14 1617  BP: 106/75  Pulse: 69  Temp: 98.2 F (36.8 C)  .  Weight: 161 lb 9.6 oz (73.301 kg).  No significant skin changes.  Impression: Tolerating radiation therapy well.  Plan: Continue radiation therapy as planned.

## 2014-11-25 ENCOUNTER — Ambulatory Visit
Admission: RE | Admit: 2014-11-25 | Discharge: 2014-11-25 | Disposition: A | Discharge status: Home / Self Care | Payer: BLUE CROSS/BLUE SHIELD | Source: Ambulatory Visit | Attending: Radiation Oncology | Admitting: Radiation Oncology

## 2014-11-25 DIAGNOSIS — C50511 Malignant neoplasm of lower-outer quadrant of right female breast: Secondary | ICD-10-CM | POA: Diagnosis not present

## 2014-11-26 ENCOUNTER — Ambulatory Visit
Admission: RE | Admit: 2014-11-26 | Discharge: 2014-11-26 | Disposition: A | Discharge status: Home / Self Care | Payer: BLUE CROSS/BLUE SHIELD | Source: Ambulatory Visit | Attending: Radiation Oncology | Admitting: Radiation Oncology

## 2014-11-26 DIAGNOSIS — C50511 Malignant neoplasm of lower-outer quadrant of right female breast: Secondary | ICD-10-CM | POA: Diagnosis not present

## 2014-11-27 ENCOUNTER — Ambulatory Visit
Admission: RE | Admit: 2014-11-27 | Discharge: 2014-11-27 | Disposition: A | Discharge status: Home / Self Care | Payer: BLUE CROSS/BLUE SHIELD | Source: Ambulatory Visit | Attending: Radiation Oncology | Admitting: Radiation Oncology

## 2014-11-27 DIAGNOSIS — C50511 Malignant neoplasm of lower-outer quadrant of right female breast: Secondary | ICD-10-CM | POA: Diagnosis not present

## 2014-11-28 ENCOUNTER — Ambulatory Visit
Admission: RE | Admit: 2014-11-28 | Discharge: 2014-11-28 | Disposition: A | Discharge status: Home / Self Care | Payer: BLUE CROSS/BLUE SHIELD | Source: Ambulatory Visit | Attending: Radiation Oncology | Admitting: Radiation Oncology

## 2014-11-28 DIAGNOSIS — C50511 Malignant neoplasm of lower-outer quadrant of right female breast: Secondary | ICD-10-CM | POA: Diagnosis not present

## 2014-12-01 ENCOUNTER — Ambulatory Visit
Admission: RE | Admit: 2014-12-01 | Discharge: 2014-12-01 | Disposition: A | Discharge status: Home / Self Care | Payer: BLUE CROSS/BLUE SHIELD | Source: Ambulatory Visit | Attending: Radiation Oncology | Admitting: Radiation Oncology

## 2014-12-01 ENCOUNTER — Encounter: Payer: Self-pay | Admitting: Radiation Oncology

## 2014-12-01 VITALS — BP 115/71 | HR 61 | Temp 98.0°F | Ht 67.0 in | Wt 161.5 lb

## 2014-12-01 DIAGNOSIS — C50511 Malignant neoplasm of lower-outer quadrant of right female breast: Secondary | ICD-10-CM | POA: Diagnosis not present

## 2014-12-01 NOTE — Progress Notes (Signed)
[name] has received 23 fractions to the right breast/chestwall. Tenderness in the right lateral breast, and she feels this is related to the tissue expanders.  .  Skin unchanged at this time.

## 2014-12-01 NOTE — Progress Notes (Signed)
   Weekly Management Note:  Outpatient    ICD-9-CM ICD-10-CM   1. Breast cancer of lower-outer quadrant of right female breast 174.5 C50.511     Current Dose:  18 Gy  Projected Dose: 60.4 Gy   Narrative:  The patient presents for routine under treatment assessment.  CBCT/MVCT images/Port film x-rays were reviewed.  The chart was checked. Doing well.  Physical Findings:  height is 5\' 7"  (1.702 m) and weight is 161 lb 8 oz (73.256 kg). Her temperature is 98 F (36.7 C). Her blood pressure is 115/71 and her pulse is 61.   Wt Readings from Last 3 Encounters:  12/01/14 161 lb 8 oz (73.256 kg)  11/24/14 161 lb 9.6 oz (73.301 kg)  10/29/14 162 lb 4.8 oz (73.619 kg)   No skin changes  Impression:  The patient is tolerating radiotherapy.  Plan:  Continue radiotherapy as planned.    ________________________________   Eppie Gibson, M.D.

## 2014-12-01 NOTE — Progress Notes (Signed)
Received FMLA paperwork, forwarded to RN for processing

## 2014-12-02 ENCOUNTER — Ambulatory Visit
Admission: RE | Admit: 2014-12-02 | Discharge: 2014-12-02 | Disposition: A | Discharge status: Home / Self Care | Payer: BLUE CROSS/BLUE SHIELD | Source: Ambulatory Visit | Attending: Radiation Oncology | Admitting: Radiation Oncology

## 2014-12-02 DIAGNOSIS — C50511 Malignant neoplasm of lower-outer quadrant of right female breast: Secondary | ICD-10-CM | POA: Diagnosis not present

## 2014-12-03 ENCOUNTER — Ambulatory Visit
Admission: RE | Admit: 2014-12-03 | Discharge: 2014-12-03 | Disposition: A | Discharge status: Home / Self Care | Payer: BLUE CROSS/BLUE SHIELD | Source: Ambulatory Visit | Attending: Radiation Oncology | Admitting: Radiation Oncology

## 2014-12-03 DIAGNOSIS — C50511 Malignant neoplasm of lower-outer quadrant of right female breast: Secondary | ICD-10-CM | POA: Diagnosis not present

## 2014-12-04 ENCOUNTER — Ambulatory Visit
Admission: RE | Admit: 2014-12-04 | Discharge: 2014-12-04 | Disposition: A | Discharge status: Home / Self Care | Payer: BLUE CROSS/BLUE SHIELD | Source: Ambulatory Visit | Attending: Radiation Oncology | Admitting: Radiation Oncology

## 2014-12-04 DIAGNOSIS — C50511 Malignant neoplasm of lower-outer quadrant of right female breast: Secondary | ICD-10-CM | POA: Diagnosis not present

## 2014-12-05 ENCOUNTER — Ambulatory Visit
Admission: RE | Admit: 2014-12-05 | Discharge: 2014-12-05 | Disposition: A | Discharge status: Home / Self Care | Payer: BLUE CROSS/BLUE SHIELD | Source: Ambulatory Visit | Attending: Radiation Oncology | Admitting: Radiation Oncology

## 2014-12-05 DIAGNOSIS — C50511 Malignant neoplasm of lower-outer quadrant of right female breast: Secondary | ICD-10-CM | POA: Diagnosis not present

## 2014-12-09 ENCOUNTER — Encounter: Payer: Self-pay | Admitting: Radiation Oncology

## 2014-12-09 ENCOUNTER — Ambulatory Visit
Admission: RE | Admit: 2014-12-09 | Discharge: 2014-12-09 | Disposition: A | Discharge status: Home / Self Care | Payer: BLUE CROSS/BLUE SHIELD | Source: Ambulatory Visit | Attending: Radiation Oncology | Admitting: Radiation Oncology

## 2014-12-09 VITALS — BP 119/79 | HR 72 | Resp 16 | Wt 161.2 lb

## 2014-12-09 DIAGNOSIS — C50511 Malignant neoplasm of lower-outer quadrant of right female breast: Secondary | ICD-10-CM

## 2014-12-09 NOTE — Progress Notes (Signed)
Weekly Management Note:  Site: Right chest wall/reconstructed breast and regional lymph nodes Current Dose:  2700  cGy Projected Dose: 5040  cGy  Narrative: The patient is seen today for routine under treatment assessment. CBCT/MVCT images/port films were reviewed. The chart was reviewed.   She is without complaints today.  She is beginning to use her Radioplex gel.  Physical Examination:  Filed Vitals:   12/09/14 1638  BP: 119/79  Pulse: 72  Resp: 16  .  Weight: 161 lb 3.2 oz (73.12 kg).  There is mild to moderate erythema along her right chest wall/clavicular region but no areas of desquamation.  Impression: Tolerating radiation therapy well.  Plan: Continue radiation therapy as planned.

## 2014-12-09 NOTE — Progress Notes (Signed)
Faxed to [facility] completed paperwork and gave patient the originals via L3 this afternoon.

## 2014-12-09 NOTE — Progress Notes (Signed)
Weight and vitals stable. Denies pain. Denies skin changes with treatment field of right chest wall. Denies using radiaplex. Encouraged patient to start using radiaplex and she verbalized understanding. Reports fatigue is manageable.  BP 119/79 mmHg  Pulse 72  Resp 16  Wt 161 lb 3.2 oz (73.12 kg)  LMP 05/28/2011 Wt Readings from Last 3 Encounters:  12/09/14 161 lb 3.2 oz (73.12 kg)  12/01/14 161 lb 8 oz (73.256 kg)  11/24/14 161 lb 9.6 oz (73.301 kg)

## 2014-12-10 ENCOUNTER — Ambulatory Visit
Admission: RE | Admit: 2014-12-10 | Discharge: 2014-12-10 | Disposition: A | Discharge status: Home / Self Care | Payer: BLUE CROSS/BLUE SHIELD | Source: Ambulatory Visit | Attending: Radiation Oncology | Admitting: Radiation Oncology

## 2014-12-10 DIAGNOSIS — C50511 Malignant neoplasm of lower-outer quadrant of right female breast: Secondary | ICD-10-CM | POA: Diagnosis not present

## 2014-12-11 ENCOUNTER — Ambulatory Visit
Admission: RE | Admit: 2014-12-11 | Discharge: 2014-12-11 | Disposition: A | Discharge status: Home / Self Care | Payer: BLUE CROSS/BLUE SHIELD | Source: Ambulatory Visit | Attending: Radiation Oncology | Admitting: Radiation Oncology

## 2014-12-11 DIAGNOSIS — C50511 Malignant neoplasm of lower-outer quadrant of right female breast: Secondary | ICD-10-CM | POA: Diagnosis not present

## 2014-12-12 ENCOUNTER — Ambulatory Visit
Admission: RE | Admit: 2014-12-12 | Discharge: 2014-12-12 | Disposition: A | Discharge status: Home / Self Care | Payer: BLUE CROSS/BLUE SHIELD | Source: Ambulatory Visit | Attending: Radiation Oncology | Admitting: Radiation Oncology

## 2014-12-12 DIAGNOSIS — C50511 Malignant neoplasm of lower-outer quadrant of right female breast: Secondary | ICD-10-CM | POA: Diagnosis not present

## 2014-12-15 ENCOUNTER — Ambulatory Visit
Admission: RE | Admit: 2014-12-15 | Discharge: 2014-12-15 | Disposition: A | Discharge status: Home / Self Care | Payer: BLUE CROSS/BLUE SHIELD | Source: Ambulatory Visit | Attending: Radiation Oncology | Admitting: Radiation Oncology

## 2014-12-15 DIAGNOSIS — C50511 Malignant neoplasm of lower-outer quadrant of right female breast: Secondary | ICD-10-CM

## 2014-12-15 NOTE — Progress Notes (Signed)
   Weekly Management Note:  Outpatient    ICD-9-CM ICD-10-CM   1. Breast cancer of lower-outer quadrant of right female breast 174.5 C50.511     Current Dose:  34.2 Gy  Projected Dose: 60.4 Gy   Narrative:  The patient presents for routine under treatment assessment.  CBCT/MVCT images/Port film x-rays were reviewed.  The chart was checked. Doing well, mild itching helped by radiaplex  Physical Findings:  vitals were not taken for this visit.  Wt Readings from Last 3 Encounters:  12/09/14 161 lb 3.2 oz (73.12 kg)  12/01/14 161 lb 8 oz (73.256 kg)  11/24/14 161 lb 9.6 oz (73.301 kg)   Erythema over right chest wall, skin intact  Impression:  The patient is tolerating radiotherapy.  Plan:  Continue radiotherapy as planned.    ________________________________   Eppie Gibson, M.D.

## 2014-12-15 NOTE — Progress Notes (Signed)
Weekly assessment of radiation to right chest wall and supraclavicular region.Completed 19 of 28 fractions.Has mild itching rash resolved with radiaplex.Denies pain.

## 2014-12-16 ENCOUNTER — Ambulatory Visit
Admission: RE | Admit: 2014-12-16 | Discharge: 2014-12-16 | Disposition: A | Discharge status: Home / Self Care | Payer: BLUE CROSS/BLUE SHIELD | Source: Ambulatory Visit | Attending: Radiation Oncology | Admitting: Radiation Oncology

## 2014-12-16 DIAGNOSIS — C50511 Malignant neoplasm of lower-outer quadrant of right female breast: Secondary | ICD-10-CM | POA: Diagnosis not present

## 2014-12-17 ENCOUNTER — Ambulatory Visit
Admission: RE | Admit: 2014-12-17 | Discharge: 2014-12-17 | Disposition: A | Discharge status: Home / Self Care | Payer: BLUE CROSS/BLUE SHIELD | Source: Ambulatory Visit | Attending: Radiation Oncology | Admitting: Radiation Oncology

## 2014-12-17 DIAGNOSIS — C50511 Malignant neoplasm of lower-outer quadrant of right female breast: Secondary | ICD-10-CM | POA: Diagnosis not present

## 2014-12-18 ENCOUNTER — Ambulatory Visit
Admission: RE | Admit: 2014-12-18 | Discharge: 2014-12-18 | Disposition: A | Discharge status: Home / Self Care | Payer: BLUE CROSS/BLUE SHIELD | Source: Ambulatory Visit | Attending: Radiation Oncology | Admitting: Radiation Oncology

## 2014-12-18 DIAGNOSIS — C50511 Malignant neoplasm of lower-outer quadrant of right female breast: Secondary | ICD-10-CM | POA: Diagnosis not present

## 2014-12-19 ENCOUNTER — Ambulatory Visit
Admission: RE | Admit: 2014-12-19 | Discharge: 2014-12-19 | Disposition: A | Discharge status: Home / Self Care | Payer: BLUE CROSS/BLUE SHIELD | Source: Ambulatory Visit | Attending: Radiation Oncology | Admitting: Radiation Oncology

## 2014-12-19 DIAGNOSIS — C50511 Malignant neoplasm of lower-outer quadrant of right female breast: Secondary | ICD-10-CM | POA: Diagnosis not present

## 2014-12-22 ENCOUNTER — Ambulatory Visit
Admission: RE | Admit: 2014-12-22 | Discharge: 2014-12-22 | Disposition: A | Discharge status: Home / Self Care | Payer: BLUE CROSS/BLUE SHIELD | Source: Ambulatory Visit | Attending: Radiation Oncology | Admitting: Radiation Oncology

## 2014-12-22 DIAGNOSIS — C50511 Malignant neoplasm of lower-outer quadrant of right female breast: Secondary | ICD-10-CM | POA: Diagnosis not present

## 2014-12-22 NOTE — Progress Notes (Addendum)
   Weekly Management Note:  Outpatient    ICD-9-CM ICD-10-CM   1. Breast cancer of lower-outer quadrant of right female breast 174.5 C50.511     Current Dose: 43.2 Gy  Projected Dose: 60.4 Gy   Narrative:  The patient presents for routine under treatment assessment.  CBCT/MVCT images/Port film x-rays were reviewed.  The chart was checked. Doing well, some moist peeling in right axilla  Physical Findings:  vitals were not taken for this visit.  Wt Readings from Last 3 Encounters:  12/09/14 161 lb 3.2 oz (73.12 kg)  12/01/14 161 lb 8 oz (73.256 kg)  11/24/14 161 lb 9.6 oz (73.301 kg)   Erythema over right chest wall, dry desquamation of chest wall, with early moist desquamation in right axilla  Impression:  The patient is tolerating radiotherapy.  Plan:  Continue radiotherapy as planned.  Neosporin rec'd for axilla  ________________________________   Eppie Gibson, M.D.

## 2014-12-23 ENCOUNTER — Ambulatory Visit
Admission: RE | Admit: 2014-12-23 | Discharge: 2014-12-23 | Disposition: A | Discharge status: Home / Self Care | Payer: BLUE CROSS/BLUE SHIELD | Source: Ambulatory Visit | Attending: Radiation Oncology | Admitting: Radiation Oncology

## 2014-12-23 DIAGNOSIS — C50511 Malignant neoplasm of lower-outer quadrant of right female breast: Secondary | ICD-10-CM | POA: Diagnosis not present

## 2014-12-24 ENCOUNTER — Ambulatory Visit
Admission: RE | Admit: 2014-12-24 | Discharge: 2014-12-24 | Disposition: A | Discharge status: Home / Self Care | Payer: BLUE CROSS/BLUE SHIELD | Source: Ambulatory Visit | Attending: Radiation Oncology | Admitting: Radiation Oncology

## 2014-12-24 ENCOUNTER — Encounter: Payer: Self-pay | Admitting: Radiation Oncology

## 2014-12-24 DIAGNOSIS — C50511 Malignant neoplasm of lower-outer quadrant of right female breast: Secondary | ICD-10-CM | POA: Diagnosis not present

## 2014-12-25 ENCOUNTER — Ambulatory Visit
Admission: RE | Admit: 2014-12-25 | Discharge: 2014-12-25 | Disposition: A | Discharge status: Home / Self Care | Payer: BLUE CROSS/BLUE SHIELD | Source: Ambulatory Visit | Attending: Radiation Oncology | Admitting: Radiation Oncology

## 2014-12-25 DIAGNOSIS — C50511 Malignant neoplasm of lower-outer quadrant of right female breast: Secondary | ICD-10-CM | POA: Diagnosis not present

## 2014-12-26 ENCOUNTER — Ambulatory Visit: Payer: BLUE CROSS/BLUE SHIELD | Admitting: Radiation Oncology

## 2014-12-26 ENCOUNTER — Ambulatory Visit
Admission: RE | Admit: 2014-12-26 | Discharge: 2014-12-26 | Disposition: A | Discharge status: Home / Self Care | Payer: BLUE CROSS/BLUE SHIELD | Source: Ambulatory Visit | Attending: Radiation Oncology | Admitting: Radiation Oncology

## 2014-12-26 DIAGNOSIS — C50511 Malignant neoplasm of lower-outer quadrant of right female breast: Secondary | ICD-10-CM | POA: Diagnosis not present

## 2014-12-29 ENCOUNTER — Ambulatory Visit
Admission: RE | Admit: 2014-12-29 | Discharge: 2014-12-29 | Disposition: A | Discharge status: Home / Self Care | Payer: BLUE CROSS/BLUE SHIELD | Source: Ambulatory Visit | Attending: Radiation Oncology | Admitting: Radiation Oncology

## 2014-12-29 ENCOUNTER — Encounter: Payer: Self-pay | Admitting: Radiation Oncology

## 2014-12-29 ENCOUNTER — Ambulatory Visit: Payer: BLUE CROSS/BLUE SHIELD | Admitting: Radiation Oncology

## 2014-12-29 VITALS — BP 122/71 | HR 71 | Temp 98.1°F | Resp 12 | Wt 161.4 lb

## 2014-12-29 DIAGNOSIS — C50511 Malignant neoplasm of lower-outer quadrant of right female breast: Secondary | ICD-10-CM

## 2014-12-29 NOTE — Progress Notes (Signed)
PAIN: She rates her pain as a 5 on a scale of 0-10. intermittent and aching over right breast. SKIN: Pt right breast- positive for Dryness, Pruritus, erythema, breast tenderness, moist desquamation and dry desquamation.  Pt reports edema right axilla.  Pt continues to apply Radiaplex and Neosporin as directed.  BP 122/71 mmHg  Pulse 71  Temp(Src) 98.1 F (36.7 C) (Oral)  Resp 12  Wt 161 lb 6.4 oz (73.211 kg)  SpO2 100%  LMP 05/28/2011 Wt Readings from Last 3 Encounters:  12/29/14 161 lb 6.4 oz (73.211 kg)  12/09/14 161 lb 3.2 oz (73.12 kg)  12/01/14 161 lb 8 oz (73.256 kg)

## 2014-12-29 NOTE — Progress Notes (Signed)
Weekly Management Note:  Site:  Right chest wall boost Current Dose:   200  cGy Projected Dose:  1000  cGy  Narrative: The patient is seen today for routine under treatment assessment. CBCT/MVCT images/port films were reviewed. The chart was reviewed.    She is currently on her right mastectomy scar boost. She has healing moist desquamations along the right axilla and inframammary region. She uses Neosporin ointment along the areas of moist desquamation.  Physical Examination:  Filed Vitals:   12/29/14 1614  BP: 122/71  Pulse: 71  Temp: 98.1 F (36.7 C)  Resp: 12  .  Weight: 161 lb 6.4 oz (73.211 kg).  There is marked erythema and hyperpigmentation the skin along the right breast and axilla.  There is a healing moist desquamation along the right axilla and a recent with desquamation along the inframammary region. This is adjacent to her right mastectomy scar boost.  Impression: Tolerating radiation therapy well except for areas of moist desquamation.  Plan: Continue radiation therapy as planned.  She will finish radiation therapy this Friday and then see Dr. Isidore Moos for a one-month follow-up visit.

## 2014-12-30 ENCOUNTER — Ambulatory Visit
Admission: RE | Admit: 2014-12-30 | Discharge: 2014-12-30 | Disposition: A | Discharge status: Home / Self Care | Payer: BLUE CROSS/BLUE SHIELD | Source: Ambulatory Visit | Attending: Radiation Oncology | Admitting: Radiation Oncology

## 2014-12-30 DIAGNOSIS — C50511 Malignant neoplasm of lower-outer quadrant of right female breast: Secondary | ICD-10-CM | POA: Diagnosis not present

## 2014-12-31 ENCOUNTER — Ambulatory Visit
Admission: RE | Admit: 2014-12-31 | Discharge: 2014-12-31 | Disposition: A | Discharge status: Home / Self Care | Payer: BLUE CROSS/BLUE SHIELD | Source: Ambulatory Visit | Attending: Radiation Oncology | Admitting: Radiation Oncology

## 2014-12-31 DIAGNOSIS — C50511 Malignant neoplasm of lower-outer quadrant of right female breast: Secondary | ICD-10-CM | POA: Diagnosis not present

## 2015-01-01 ENCOUNTER — Ambulatory Visit
Admission: RE | Admit: 2015-01-01 | Discharge: 2015-01-01 | Disposition: A | Discharge status: Home / Self Care | Payer: BLUE CROSS/BLUE SHIELD | Source: Ambulatory Visit | Attending: Radiation Oncology | Admitting: Radiation Oncology

## 2015-01-01 DIAGNOSIS — C50511 Malignant neoplasm of lower-outer quadrant of right female breast: Secondary | ICD-10-CM | POA: Diagnosis not present

## 2015-01-02 ENCOUNTER — Encounter: Payer: Self-pay | Admitting: Radiation Oncology

## 2015-01-02 ENCOUNTER — Ambulatory Visit
Admission: RE | Admit: 2015-01-02 | Discharge: 2015-01-02 | Disposition: A | Discharge status: Home / Self Care | Payer: BLUE CROSS/BLUE SHIELD | Source: Ambulatory Visit | Attending: Radiation Oncology | Admitting: Radiation Oncology

## 2015-01-02 VITALS — BP 118/86 | HR 84 | Temp 98.3°F | Resp 12 | Wt 161.0 lb

## 2015-01-02 DIAGNOSIS — C50511 Malignant neoplasm of lower-outer quadrant of right female breast: Secondary | ICD-10-CM

## 2015-01-02 NOTE — Progress Notes (Signed)
   Department of Radiation Oncology  Phone:  [phone] Fax:        [phone]  Weekly Treatment Note    Name: Olivia Acosta Date: 01/02/2015 MRN: [id number] DOB: 1961/03/08   Current dose: 60.4 Gy  Current fraction:33   MEDICATIONS: Current Outpatient Prescriptions  Medication Sig Dispense Refill  . acetaminophen (TYLENOL) 500 MG tablet Take 1,000 mg by mouth every 6 (six) hours as needed for moderate pain, fever or headache (headache and pain).     Marland Kitchen dexamethasone (DECADRON) 4 MG tablet Take 4 mg by mouth as directed.   1  . dextromethorphan (DELSYM) 30 MG/5ML liquid Take 2.5 mLs (15 mg total) by mouth 2 (two) times daily. (Patient not taking: Reported on 12/15/2014) 89 mL 0  . esomeprazole (NEXIUM) 20 MG capsule Take 1 capsule (20 mg total) by mouth daily at 12 noon. (Patient taking differently: Take 20 mg by mouth daily as needed (heartburn). ) 30 capsule 1  . hydrochlorothiazide (HYDRODIURIL) 12.5 MG tablet Take 1 tablet (12.5 mg total) by mouth daily. (Patient not taking: Reported on 12/15/2014) 5 tablet 0  . lidocaine-prilocaine (EMLA) cream Apply topically once.   3  . LORazepam (ATIVAN) 0.5 MG tablet Take 1 tablet (0.5 mg total) by mouth every 6 (six) hours as needed for anxiety or sleep. (Patient not taking: Reported on 12/15/2014) 30 tablet 0  . Multiple Vitamins-Minerals (MULTIVITAMIN & MINERAL PO) Take 1 tablet by mouth daily.    . valACYclovir (VALTREX) 500 MG tablet Take 500 mg by mouth daily.     No current facility-administered medications for this encounter.     ALLERGIES: Penicillins   LABORATORY DATA:  Lab Results  Component Value Date   WBC 4.6 11/03/2014   HGB 11.1* 11/03/2014   HCT 33.8* 11/03/2014   MCV 92.3 11/03/2014   PLT 238 11/03/2014   Lab Results  Component Value Date   NA 140 11/03/2014   K 3.9 11/03/2014   CL 105 11/03/2014   CO2 26 11/03/2014   Lab Results  Component Value Date   ALT 16 11/03/2014   AST 23 11/03/2014   ALKPHOS 56 11/03/2014   BILITOT 0.2* 11/03/2014     NARRATIVE: Olivia Acosta was seen today for weekly treatment management. The chart was checked and the patient's films were reviewed.  PAIN: She rates her pain as a 5 on a scale of 0-10. intermittent and dull over right breast incision. SKIN: Pt right breast- positive for Dryness, Hyperpigmentation, Pruritus, erythema, breast tenderness and dry desquamation.  Pt reports edema over right axilla.  Pt continues to apply Radiaplex and Neosporin to open areas covered with telfa as directed. OTHER:  BP 118/86 mmHg  Pulse 84  Temp(Src) 98.3 F (36.8 C) (Oral)  Resp 12  Wt 161 lb (73.029 kg)  SpO2 100%  LMP 05/28/2011 Wt Readings from Last 3 Encounters:  01/02/15 161 lb (73.029 kg)  12/29/14 161 lb 6.4 oz (73.211 kg)  12/09/14 161 lb 3.2 oz (73.12 kg)     PHYSICAL EXAMINATION: weight is 161 lb (73.029 kg). Her oral temperature is 98.3 F (36.8 C). Her blood pressure is 118/86 and her pulse is 84. Her respiration is 12 and oxygen saturation is 100%.      Diffuse erythema and dry desquamation; no moist desquamation  ASSESSMENT: The patient is doing satisfactorily with treatment. She completed treatment today.  PLAN: Follow-up with Dr. Isidore Moos in 1 month.

## 2015-01-02 NOTE — Progress Notes (Signed)
PAIN: She rates her pain as a 5 on a scale of 0-10. intermittent and dull over right breast incision. SKIN: Pt right breast- positive for Dryness, Hyperpigmentation, Pruritus, erythema, breast tenderness and dry desquamation.  Pt reports edema over right axilla.  Pt continues to apply Radiaplex and Neosporin to open areas covered with telfa as directed. OTHER:  BP 118/86 mmHg  Pulse 84  Temp(Src) 98.3 F (36.8 C) (Oral)  Resp 12  Wt 161 lb (73.029 kg)  SpO2 100%  LMP 05/28/2011 Wt Readings from Last 3 Encounters:  01/02/15 161 lb (73.029 kg)  12/29/14 161 lb 6.4 oz (73.211 kg)  12/09/14 161 lb 3.2 oz (73.12 kg)

## 2015-01-14 NOTE — Progress Notes (Signed)
  Radiation Oncology         [phone] ________________________________  Name: Olivia Acosta MRN: [id number]  Date: 01/02/2015  DOB: 08/25/1960  End of Treatment Note   DIAGNOSIS:    ICD-9-CM ICD-10-CM   1. Breast cancer of lower-outer quadrant of right female breast 174.5 C50.511       Stage IIIA  pT3, pN2a, cM0 Right Breast LOQ Invasive Lobular Carcinoma, ER+ / PR+ / Her2neg, Grade II.  Indication for treatment:  curative       Radiation treatment dates:   11/18/2014-01/02/2015  Site/dose:   1) Right Chest wall/ 50.4 Gy in 28 fractions 2) Right SCV/ Axilla/ 50.4 Gy in 28 fractions 3) Right Chest Wall Scar Boost / 10 Gy in 5 fractions  Beams/energy: 1) 3D Tangents / 6 MV 2)    2 photon fields / 6 and 10 MV 3)  Electron boost / 6 MeV  Narrative: The patient tolerated radiation treatment relatively well.    Plan: The patient has completed radiation treatment. The patient will return to radiation oncology clinic for routine followup in one month. I advised them to call or return sooner if they have any questions or concerns related to their recovery or treatment.  -----------------------------------  Eppie Gibson, MD

## 2015-01-30 ENCOUNTER — Ambulatory Visit
Admission: RE | Admit: 2015-01-30 | Discharge: 2015-01-30 | Disposition: A | Discharge status: Home / Self Care | Payer: BLUE CROSS/BLUE SHIELD | Source: Ambulatory Visit | Attending: Radiation Oncology | Admitting: Radiation Oncology

## 2015-01-30 ENCOUNTER — Encounter: Payer: Self-pay | Admitting: Radiation Oncology

## 2015-01-30 VITALS — BP 125/64 | HR 73 | Temp 98.1°F | Resp 12 | Ht 67.0 in | Wt 159.1 lb

## 2015-01-30 DIAGNOSIS — C50511 Malignant neoplasm of lower-outer quadrant of right female breast: Secondary | ICD-10-CM

## 2015-01-30 NOTE — Progress Notes (Signed)
  Radiation Oncology         [phone] ________________________________  Name: Zenith Kercheval MRN: [id number]  Date: 01/30/2015  DOB: December 28, 1960  Follow-Up Visit Note  Outpatient  CC: Kathlene November, MD  Nicholas Lose, MD  Diagnosis and Prior Radiotherapy:    ICD-9-CM ICD-10-CM   1. Breast cancer of lower-outer quadrant of right female breast 174.5 C50.511       Narrative:  The patient returns today for routine 1 month follow-up. She reports having pain due to a tissue expander in her right breast. She denies fatigue. She has an appointment with Dr. Lindi Adie on Monday and is not sure about antiestrogen therapy. The skin on her right breast has hyperpigmentation with small peeling areas on the bottom portion of her breast. She is using radiaplex and neosporin on this area.  ALLERGIES:  is allergic to penicillins.  Meds: Current Outpatient Prescriptions  Medication Sig Dispense Refill  . esomeprazole (NEXIUM) 20 MG capsule Take 1 capsule (20 mg total) by mouth daily at 12 noon. 30 capsule 1  . valACYclovir (VALTREX) 500 MG tablet Take 500 mg by mouth daily.    Marland Kitchen acetaminophen (TYLENOL) 500 MG tablet Take 1,000 mg by mouth every 6 (six) hours as needed for moderate pain, fever or headache (headache and pain).     Marland Kitchen dexamethasone (DECADRON) 4 MG tablet Take 4 mg by mouth as directed.   1  . dextromethorphan (DELSYM) 30 MG/5ML liquid Take 2.5 mLs (15 mg total) by mouth 2 (two) times daily. (Patient not taking: Reported on 12/15/2014) 89 mL 0  . hydrochlorothiazide (HYDRODIURIL) 12.5 MG tablet Take 1 tablet (12.5 mg total) by mouth daily. (Patient not taking: Reported on 12/15/2014) 5 tablet 0  . lidocaine-prilocaine (EMLA) cream Apply topically once.   3  . LORazepam (ATIVAN) 0.5 MG tablet Take 1 tablet (0.5 mg total) by mouth every 6 (six) hours as needed for anxiety or sleep. (Patient not taking: Reported on 12/15/2014) 30 tablet 0  . Multiple Vitamins-Minerals (MULTIVITAMIN & MINERAL PO) Take  1 tablet by mouth daily.     No current facility-administered medications for this encounter.    Physical Findings: The patient is in no acute distress. Patient is alert and oriented.  height is 5\' 7"  (1.702 m) and weight is 159 lb 1.6 oz (72.167 kg). Her oral temperature is 98.1 F (36.7 C). Her blood pressure is 125/64 and her pulse is 73. Her respiration is 12.  Skin is still slightly hyperpigmented over the right resconstructed breast. Skin intact. Expanders in place bilaterally.  Lab Findings: Lab Results  Component Value Date   WBC 4.6 11/03/2014   HGB 11.1* 11/03/2014   HCT 33.8* 11/03/2014   MCV 92.3 11/03/2014   PLT 238 11/03/2014    Radiographic Findings: No results found.  Impression/Plan: Doing well. I encouraged her to continue with yearly mammography and followup with medical oncology. I will see her back on an as-needed basis. I have encouraged her to call if she has any issues or concerns in the future. I wished her the very best. Encouraged her to take anti-estrogen therapy per Dr. Geralyn Flash recommendation. Apply Vit E oil to skin in RT fields for healing.    _____________________________________   Eppie Gibson, MD

## 2015-01-30 NOTE — Progress Notes (Signed)
Olivia Acosta here for follow up.  She reports having pain due to her tissue expander in her right breast.  She denies fatigue.  She has an appointment with Dr. Lindi Adie on Monday and is not sure about antiestrogen therapy.  The skin on her right breast has hyperpigmentation with small peeling areas on the bottom portion of her breast.  She is using radiaplex and neosporin on this area.  BP 125/64 mmHg  Pulse 73  Temp(Src) 98.1 F (36.7 C) (Oral)  Resp 12  Ht 5\' 7"  (1.702 m)  Wt 159 lb 1.6 oz (72.167 kg)  BMI 24.91 kg/m2  LMP 05/28/2011

## 2015-02-02 ENCOUNTER — Ambulatory Visit (HOSPITAL_BASED_OUTPATIENT_CLINIC_OR_DEPARTMENT_OTHER): Payer: BLUE CROSS/BLUE SHIELD | Admitting: Hematology and Oncology

## 2015-02-02 ENCOUNTER — Telehealth: Payer: Self-pay | Admitting: Hematology and Oncology

## 2015-02-02 ENCOUNTER — Encounter: Payer: Self-pay | Admitting: Hematology and Oncology

## 2015-02-02 VITALS — BP 106/73 | HR 62 | Temp 98.5°F | Resp 18 | Ht 67.0 in | Wt 161.1 lb

## 2015-02-02 DIAGNOSIS — C773 Secondary and unspecified malignant neoplasm of axilla and upper limb lymph nodes: Secondary | ICD-10-CM

## 2015-02-02 DIAGNOSIS — C50511 Malignant neoplasm of lower-outer quadrant of right female breast: Secondary | ICD-10-CM | POA: Diagnosis not present

## 2015-02-02 MED ORDER — ANASTROZOLE 1 MG PO TABS: 1.0000 mg | ORAL_TABLET | Freq: Every day | ORAL | Status: DC

## 2015-02-02 NOTE — Progress Notes (Signed)
Patient Care Team: Colon Branch, MD as PCP - General  DIAGNOSIS: Breast cancer of lower-outer quadrant of right female breast   Staging form: Breast, AJCC 7th Edition     Clinical: Stage IIA (T2(m), N0, M0) - Unsigned     Pathologic: Stage IIIA (T3, N2a, cM0) - Signed by Rulon Eisenmenger, MD on 06/12/2014   SUMMARY OF ONCOLOGIC HISTORY:   Breast cancer of lower-outer quadrant of right female breast   01/31/2014 Breast MRI Right breast mass 3.3 x 2.8 x 3.4 cm with several foci of subcentimeter enhancing nodules extending anteriorly to worsen nipple up to 5.4 cm anterior to the mass    04/17/2014 Surgery Bilateral mastectomies, left: Benign, right breast multifocal ILC grade 2 largest 5.5 cm, posterior margin focally positive, 2 SLN's positive with extracapsular extension + 4/10 axillary LN positive, ER 92%, PR 98%, HER-2 -1.1.3, Ki 67 10%   06/12/2014 - 10/23/2014 Chemotherapy Adriamycin and Cytoxan dose dense every 2 weeks x4 followed by Taxol x12   09/14/2014 - 09/16/2014 Hospital Admission Neutropenic fever   12/09/2014 - 01/20/2015 Radiation Therapy Adjuvant radiation therapy   02/02/2015 -  Anti-estrogen oral therapy Anastrozole 1 mg daily X 5 years    CHIEF COMPLIANT: F/U after XRT  INTERVAL HISTORY: Olivia Acosta is a 54 yr old who completed XRT and is here to start anti-estrogen therapy. She has done well from XRT and denies any further problems. 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 But shes planning on running and taking up physical activity. She has gained 15lbs with chemo and is determined to lose it.  REVIEW OF SYSTEMS:   Constitutional: Denies fevers, chills or abnormal weight loss Eyes: Denies blurriness of vision Ears, nose, mouth, throat, and face: Denies mucositis or sore throat Respiratory: Denies cough, dyspnea or wheezes Cardiovascular: Denies palpitation, chest discomfort or lower extremity swelling Gastrointestinal:  Denies nausea, heartburn or change in bowel habits Skin: Denies  abnormal skin rashes Lymphatics: Denies new lymphadenopathy or easy bruising Neurological:Denies numbness, tingling or new weaknesses Behavioral/Psych: Mood is stable, no new changes  Breast:  denies any pain or lumps or nodules in either breasts All other systems were reviewed with the patient and are negative.  I have reviewed the past medical history, past surgical history, social history and family history with the patient and they are unchanged from previous note.  ALLERGIES:  is allergic to penicillins.  MEDICATIONS:  Current Outpatient Prescriptions  Medication Sig Dispense Refill  . esomeprazole (NEXIUM) 20 MG capsule Take 1 capsule (20 mg total) by mouth daily at 12 noon. 30 capsule 1  . valACYclovir (VALTREX) 500 MG tablet Take 500 mg by mouth daily.    Marland Kitchen anastrozole (ARIMIDEX) 1 MG tablet Take 1 tablet (1 mg total) by mouth daily. 90 tablet 3   No current facility-administered medications for this visit.    PHYSICAL EXAMINATION: ECOG PERFORMANCE STATUS: 0 - Asymptomatic  Filed Vitals:   02/02/15 1502  BP: 106/73  Pulse: 62  Temp: 98.5 F (36.9 C)  Resp: 18   Filed Weights   02/02/15 1502  Weight: 161 lb 1.6 oz (73.074 kg)    GENERAL:alert, no distress and comfortable SKIN: skin color, texture, turgor are normal, no rashes or significant lesions EYES: normal, Conjunctiva are pink and non-injected, sclera clear OROPHARYNX:no exudate, no erythema and lips, buccal mucosa, and tongue normal  NECK: supple, thyroid normal size, non-tender, without nodularity LYMPH:  no palpable lymphadenopathy in the cervical, axillary or inguinal LUNGS: clear to auscultation and  percussion with normal breathing effort HEART: regular rate & rhythm and no murmurs and no lower extremity edema ABDOMEN:abdomen soft, non-tender and normal bowel sounds Musculoskeletal:no cyanosis of digits and no clubbing  NEURO: alert & oriented x 3 with fluent speech, no focal motor/sensory  deficits  LABORATORY DATA:  I have reviewed the data as listed   Chemistry      Component Value Date/Time   NA 140 11/03/2014 1730   NA 141 10/23/2014 1453   K 3.9 11/03/2014 1730   K 3.9 10/23/2014 1453   CL 105 11/03/2014 1730   CO2 26 11/03/2014 1730   CO2 23 10/23/2014 1453   BUN 17 11/03/2014 1730   BUN 15.4 10/23/2014 1453   CREATININE 0.72 11/03/2014 1730   CREATININE 0.8 10/23/2014 1453      Component Value Date/Time   CALCIUM 9.1 11/03/2014 1730   CALCIUM 8.6 10/23/2014 1453   ALKPHOS 56 11/03/2014 1730   ALKPHOS 60 10/23/2014 1453   AST 23 11/03/2014 1730   AST 20 10/23/2014 1453   ALT 16 11/03/2014 1730   ALT 15 10/23/2014 1453   BILITOT 0.2* 11/03/2014 1730   BILITOT 0.22 10/23/2014 1453       Lab Results  Component Value Date   WBC 4.6 11/03/2014   HGB 11.1* 11/03/2014   HCT 33.8* 11/03/2014   MCV 92.3 11/03/2014   PLT 238 11/03/2014   NEUTROABS 3.0 11/03/2014   ASSESSMENT & PLAN:  Breast cancer of lower-outer quadrant of right female breast Right breast invasive lobular cancer 5.5 cm multifocal disease with 6/12 lymph nodes positive T3, N2, M0 stage IIIa, ER 92%, PR 98%, HER-2 negative ratio 1.13, Ki-67 10% status post bilateral mastectomies on 04/17/2014, status post adjuvant chemotherapy started 06/12/2014 and completed 10/23/2014, started radiation June 2016 completed August 2016.  Recommendation: 1. Adjuvant antiestrogen therapy: patient has been menopausal since she was 54 yrs old. So I recommended Anastrozole 1 mg daily x 5 years We discussed the risks and benefits of anti-estrogen therapy with aromatase inhibitors. These include but not limited to insomnia, hot flashes, mood changes, vaginal dryness, bone density loss, and weight gain. Although rare, serious side effects including endometrial cancer, risk of blood clots were also discussed. We strongly believe that the benefits far outweigh the risks. Patient understands these risks and consented  to starting treatment. Planned treatment duration is 5 years.  Patient drinks lots of milk and is quite often outdoors. So we elected to not have her take calcium and vit D. I instructed her to get bone density with her Gyn (Dr.Grewall) RTC 3 months   No orders of the defined types were placed in this encounter.   The patient has a good understanding of the overall plan. she agrees with it. she will call with any problems that may develop before the next visit here.   Rulon Eisenmenger, MD

## 2015-02-02 NOTE — Assessment & Plan Note (Signed)
Right breast invasive lobular cancer 5.5 cm multifocal disease with 6/12 lymph nodes positive T3, N2, M0 stage IIIa, ER 92%, PR 98%, HER-2 negative ratio 1.13, Ki-67 10% status post bilateral mastectomies on 04/17/2014, status post adjuvant chemotherapy started 06/12/2014 and completed 10/23/2014, started radiation June 2016 completed August 2016.  Recommendation: 1. Adjuvant antiestrogen therapy

## 2015-02-02 NOTE — Telephone Encounter (Signed)
Gave avs & calendar for November.  °

## 2015-02-11 NOTE — Progress Notes (Signed)
[employer] representative Note 12-24-14 Diagnosis: Breast Cancer OUTPATIENT  The patient's CT images from her initial simulation were reviewed to plan her boost treatment to her right chest wall scar.  Measurements were made regarding the size and depth of the scar site. The boost  will be delivered with 6 MeV electrons; 10 Gy in 5 fractions has been prescribed to the 92% isodose line.   A special port  plan was reviewed and approved.  A custom electron cut-out will be used for her boost field.    -----------------------------------  Eppie Gibson, MD

## 2015-03-27 ENCOUNTER — Telehealth: Payer: Self-pay

## 2015-03-27 NOTE — Telephone Encounter (Signed)
Faxed approval to have port removed with plastic surgery.  Sent to scan.

## 2015-05-04 ENCOUNTER — Ambulatory Visit (HOSPITAL_BASED_OUTPATIENT_CLINIC_OR_DEPARTMENT_OTHER): Payer: BLUE CROSS/BLUE SHIELD | Admitting: Hematology and Oncology

## 2015-05-04 ENCOUNTER — Encounter: Payer: Self-pay | Admitting: Hematology and Oncology

## 2015-05-04 VITALS — BP 108/67 | HR 62 | Temp 98.0°F | Resp 18 | Ht 67.0 in | Wt 164.7 lb

## 2015-05-04 DIAGNOSIS — Z79811 Long term (current) use of aromatase inhibitors: Secondary | ICD-10-CM | POA: Diagnosis not present

## 2015-05-04 DIAGNOSIS — Z170 Estrogen receptor positive status [ER+]: Secondary | ICD-10-CM | POA: Diagnosis not present

## 2015-05-04 DIAGNOSIS — C50511 Malignant neoplasm of lower-outer quadrant of right female breast: Secondary | ICD-10-CM

## 2015-05-04 NOTE — Assessment & Plan Note (Signed)
Right breast invasive lobular cancer 5.5 cm multifocal disease with 6/12 lymph nodes positive T3, N2, M0 stage IIIa, ER 92%, PR 98%, HER-2 negative ratio 1.13, Ki-67 10% status post bilateral mastectomies on 04/17/2014, status post adjuvant chemotherapy started 06/12/2014 and completed 10/23/2014, started radiation June 2016 completed August 2016. Started anastrozole 02/02/2015 5 years  Anastrozole toxicities:  Patient drinks lots of milk and is quite often outdoors. So we elected to not have her take calcium and vit D. I instructed her to get bone density with her Gyn (Dr.Grewall) RTC 6 months

## 2015-05-04 NOTE — Progress Notes (Signed)
Patient Care Team: Colon Branch, MD as PCP - General  DIAGNOSIS: Breast cancer of lower-outer quadrant of right female breast [facility])   Staging form: Breast, AJCC 7th Edition     Clinical: Stage IIA (T2(m), N0, M0) - Unsigned     Pathologic: Stage IIIA (T3, N2a, cM0) - Signed by Rulon Eisenmenger, MD on 06/12/2014   SUMMARY OF ONCOLOGIC HISTORY:   Breast cancer of lower-outer quadrant of right female breast ([place])   01/31/2014 Breast MRI Right breast mass 3.3 x 2.8 x 3.4 cm with several foci of subcentimeter enhancing nodules extending anteriorly to worsen nipple up to 5.4 cm anterior to the mass    04/17/2014 Surgery Bilateral mastectomies, left: Benign, right breast multifocal ILC grade 2 largest 5.5 cm, posterior margin focally positive, 2 SLN's positive with extracapsular extension + 4/10 axillary LN positive, ER 92%, PR 98%, HER-2 -1.1.3, Ki 67 10%   06/12/2014 - 10/23/2014 Chemotherapy Adriamycin and Cytoxan dose dense every 2 weeks x4 followed by Taxol x12   09/14/2014 - 09/16/2014 Hospital Admission Neutropenic fever   12/09/2014 - 01/20/2015 Radiation Therapy Adjuvant radiation therapy   02/02/2015 -  Anti-estrogen oral therapy Anastrozole 1 mg daily X 5 years    CHIEF COMPLIANT: follow-up on anastrozole  INTERVAL HISTORY: Olivia Acosta is a 54 year old with above-mentioned history of right breast cancer treated with bilateral mastectomies followed by adjuvant chemotherapy and radiation and is currently on anastrozole. She is tolerating it extremely well. She denies any hot flashes or myalgias. 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  REVIEW OF SYSTEMS:   Constitutional: Denies fevers, chills or abnormal weight loss Eyes: Denies blurriness of vision Ears, nose, mouth, throat, and face: Denies mucositis or sore throat Respiratory: Denies cough, dyspnea or  wheezes Cardiovascular: Denies palpitation, chest discomfort or lower extremity swelling Gastrointestinal:  Denies nausea, heartburn or change in bowel habits Skin: Denies abnormal skin rashes Lymphatics: Denies new lymphadenopathy or easy bruising Neurological:Denies numbness, tingling or new weaknesses Behavioral/Psych: Mood is stable, no new changes  Breast:  denies any pain or lumps or nodules in either breasts All other systems were reviewed with the patient and are negative.  I have reviewed the past medical history, past surgical history, social history and family history with the patient and they are unchanged from previous note.  ALLERGIES:  is allergic to penicillins.  MEDICATIONS:  Current Outpatient Prescriptions  Medication Sig Dispense Refill  . anastrozole (ARIMIDEX) 1 MG tablet Take 1 tablet (1 mg total) by mouth daily. 90 tablet 3  . esomeprazole (NEXIUM) 20 MG capsule Take 1 capsule (20 mg total) by mouth daily at 12 noon. 30 capsule 1  . valACYclovir (VALTREX) 500 MG tablet Take 500 mg by mouth daily.     No current facility-administered medications for this visit.    PHYSICAL EXAMINATION: ECOG PERFORMANCE STATUS: 1 - Symptomatic but completely ambulatory  Filed Vitals:   05/04/15 1533  BP: 108/67  Pulse: 62  Temp: 98 F (36.7 C)  Resp: 18   Filed Weights   05/04/15 1533  Weight: 164 lb 11.2 oz (74.707 kg)    GENERAL:alert, no distress and comfortable SKIN: skin color, texture, turgor are normal, no rashes or significant lesions EYES: normal, Conjunctiva are pink and non-injected, sclera clear OROPHARYNX:no exudate, no erythema and lips, buccal mucosa, and tongue normal  NECK: supple, thyroid normal size, non-tender, without nodularity LYMPH:  no palpable lymphadenopathy in the cervical, axillary or inguinal LUNGS: clear to auscultation and percussion with normal breathing effort HEART: regular rate & rhythm and no murmurs and no lower extremity  edema ABDOMEN:abdomen soft, non-tender and normal bowel sounds Musculoskeletal:no cyanosis of digits and no clubbing  NEURO: alert & oriented x 3 with fluent speech, no focal motor/sensory deficits  LABORATORY DATA:  I have reviewed the data as listed   Chemistry      Component Value Date/Time   NA 140 11/03/2014 1730   NA 141 10/23/2014 1453   K 3.9 11/03/2014 1730   K 3.9 10/23/2014 1453   CL 105 11/03/2014 1730   CO2 26 11/03/2014 1730   CO2 23 10/23/2014 1453   BUN 17 11/03/2014 1730   BUN 15.4 10/23/2014 1453   CREATININE 0.72 11/03/2014 1730   CREATININE 0.8 10/23/2014 1453      Component Value Date/Time   CALCIUM 9.1 11/03/2014 1730   CALCIUM 8.6 10/23/2014 1453   ALKPHOS 56 11/03/2014 1730   ALKPHOS 60 10/23/2014 1453   AST 23 11/03/2014 1730   AST 20 10/23/2014 1453   ALT 16 11/03/2014 1730   ALT 15 10/23/2014 1453   BILITOT 0.2* 11/03/2014 1730   BILITOT 0.22 10/23/2014 1453       Lab Results  Component Value Date   WBC 4.6 11/03/2014   HGB 11.1* 11/03/2014   HCT 33.8* 11/03/2014   MCV 92.3 11/03/2014   PLT 238 11/03/2014   NEUTROABS 3.0 11/03/2014   ASSESSMENT & PLAN:  Breast cancer of lower-outer quadrant of right female breast Right breast invasive lobular cancer 5.5 cm multifocal disease with 6/12 lymph nodes positive T3, N2, M0 stage IIIa, ER 92%, PR 98%, HER-2 negative ratio 1.13, Ki-67 10% status post bilateral mastectomies on 04/17/2014, status post adjuvant chemotherapy started 06/12/2014 and completed 10/23/2014, started radiation June 2016 completed August 2016. Started anastrozole 02/02/2015 5 years  Anastrozole toxicities: Denies any hot flashes or myalgias. Tolerating it extremely well.  Patient has surgery planned for December 29 for breast reconstruction. She hopes to get the port removed at the same surgery.  Patient drinks lots of milk and is quite often outdoors. So we elected to not have her take calcium and vit D. Bone density  03/24/2015 (Dr.Grewall) T score -0.8 normal  RTC 6 months   No orders of the defined types were placed in this encounter.   The patient has a good understanding of the overall plan. she agrees with it. she will call with any problems that may develop before the next visit here.   Rulon Eisenmenger, MD 05/04/2015     Following 1 thank you

## 2015-05-04 NOTE — Addendum Note (Signed)
Addended by: Prentiss Bells on: 05/04/2015 06:33 PM   Modules accepted: Medications

## 2015-05-06 ENCOUNTER — Telehealth: Payer: Self-pay | Admitting: Hematology and Oncology

## 2015-05-06 NOTE — Telephone Encounter (Signed)
lvm fo rpt regarding to May 2017 appt

## 2015-05-20 ENCOUNTER — Other Ambulatory Visit (HOSPITAL_COMMUNITY): Payer: Self-pay | Admitting: Plastic Surgery

## 2015-05-26 ENCOUNTER — Other Ambulatory Visit (HOSPITAL_COMMUNITY): Payer: BLUE CROSS/BLUE SHIELD

## 2015-05-26 ENCOUNTER — Encounter (HOSPITAL_COMMUNITY)
Admission: RE | Admit: 2015-05-26 | Discharge: 2015-05-26 | Disposition: A | Discharge status: Home / Self Care | Payer: BLUE CROSS/BLUE SHIELD | Source: Ambulatory Visit | Attending: Plastic Surgery | Admitting: Plastic Surgery

## 2015-05-26 ENCOUNTER — Encounter (HOSPITAL_COMMUNITY): Payer: Self-pay

## 2015-05-26 DIAGNOSIS — Z01812 Encounter for preprocedural laboratory examination: Secondary | ICD-10-CM | POA: Insufficient documentation

## 2015-05-26 DIAGNOSIS — C50919 Malignant neoplasm of unspecified site of unspecified female breast: Secondary | ICD-10-CM | POA: Insufficient documentation

## 2015-05-26 HISTORY — DX: Polyneuropathy, unspecified: G62.9

## 2015-05-26 HISTORY — DX: Bronchitis, not specified as acute or chronic: J40

## 2015-05-26 HISTORY — DX: Gastro-esophageal reflux disease without esophagitis: K21.9

## 2015-05-26 HISTORY — DX: Unspecified osteoarthritis, unspecified site: M19.90

## 2015-05-26 LAB — BASIC METABOLIC PANEL
ANION GAP: 11 (ref 5–15)
BUN: 12 mg/dL (ref 6–20)
CALCIUM: 9.7 mg/dL (ref 8.9–10.3)
CO2: 25 mmol/L (ref 22–32)
CREATININE: 0.76 mg/dL (ref 0.44–1.00)
Chloride: 105 mmol/L (ref 101–111)
Glucose, Bld: 100 mg/dL — ABNORMAL HIGH (ref 65–99)
Potassium: 4.1 mmol/L (ref 3.5–5.1)
SODIUM: 141 mmol/L (ref 135–145)

## 2015-05-26 LAB — CBC
HCT: 40.7 % (ref 36.0–46.0)
HEMOGLOBIN: 13.9 g/dL (ref 12.0–15.0)
MCH: 31.7 pg (ref 26.0–34.0)
MCHC: 34.2 g/dL (ref 30.0–36.0)
MCV: 92.7 fL (ref 78.0–100.0)
Platelets: 184 10*3/uL (ref 150–400)
RBC: 4.39 MIL/uL (ref 3.87–5.11)
RDW: 13.3 % (ref 11.5–15.5)
WBC: 6 10*3/uL (ref 4.0–10.5)

## 2015-05-26 NOTE — Pre-Procedure Instructions (Signed)
Olivia Acosta  05/26/2015      [id number] - [place], [place] - [address] [place] [address] Phone: [phone] Fax: [phone]    Your procedure is scheduled on 06/04/2015.  Report to [facility] Admitting at 5:30 A.M.  Call this number if you have problems the morning of surgery:  [phone]   Remember:  Do not eat food or drink liquids after midnight.  On Wednesday   Take these medicines the morning of surgery with A SIP OF WATER : Arimidex & Valtrex    Do not wear jewelry, make-up or nail polish.   Do not wear lotions, powders, or perfumes.  You may wear deodorant.   Do not shave 48 hours prior to surgery.     Do not bring valuables to the hospital.   [facility] is not responsible for any belongings or valuables.  Contacts, dentures or bridgework may not be worn into surgery.  Leave your suitcase in the car.  After surgery it may be brought to your room.  For patients admitted to the hospital, discharge time will be determined by your treatment team.  Patients discharged the day of surgery will not be allowed to drive home.   Name and phone number of your driver:   MOTHER  Special instructions:  Special Instructions: [place] - Preparing for Surgery  Before surgery, you can play an important role.  Because skin is not sterile, your skin needs to be as free of germs as possible.  You can reduce the number of germs on you skin by washing with CHG (chlorahexidine gluconate) soap before surgery.  CHG is an antiseptic cleaner which kills germs and bonds with the skin to continue killing germs even after washing.  Please DO NOT use if you have an allergy to CHG or antibacterial soaps.  If your skin becomes reddened/irritated stop using the CHG and inform your nurse when you arrive at Short Stay.  Do not shave (including legs and underarms) for at least 48 hours prior  to the first CHG shower.  You may shave your face.  Please follow these instructions carefully:   1.  Shower with CHG Soap the night before surgery and the  morning of Surgery.  2.  If you choose to wash your hair, wash your hair first as usual with your  normal shampoo.  3.  After you shampoo, rinse your hair and body thoroughly to remove the  Shampoo.  4.  Use CHG as you would any other liquid soap.  You can apply chg directly to the skin and wash gently with scrungie or a clean washcloth.  5.  Apply the CHG Soap to your body ONLY FROM THE NECK DOWN.    Do not use on open wounds or open sores.  Avoid contact with your eyes, ears, mouth and genitals (private parts).  Wash genitals (private parts)   with your normal soap.  6.  Wash thoroughly, paying special attention to the area where your surgery will be performed.  7.  Thoroughly rinse your body with warm water from the neck down.  8.  DO NOT shower/wash with your normal soap after using and rinsing off   the CHG Soap.  9.  Pat yourself dry with a clean towel.            10.  Wear clean pajamas.  11.  Place clean sheets on your bed the night of your first shower and do not sleep with pets.  Day of Surgery  Do not apply any lotions/deodorants the morning of surgery.  Please wear clean clothes to the hospital/surgery center.  Please read over the following fact sheets that you were given. Pain Booklet, Coughing and Deep Breathing and Surgical Site Infection Prevention

## 2015-05-26 NOTE — Progress Notes (Signed)
Pt. Denies any chest concerns, reports that she is looking forward to having the surgery next week. She had a visit to the ED in 09/2014 for fever & flu like symptoms.  She was admitted with Bronchitis, tx with antibx. Echo- done prior to Chemo, wnl.

## 2015-06-03 MED ORDER — HEPARIN SODIUM (PORCINE) 5000 UNIT/ML IJ SOLN
5000.0000 [IU] | Freq: Once | INTRAMUSCULAR | Status: AC
  Administered 2015-06-04: 5000 [IU] via SUBCUTANEOUS
  Filled 2015-06-03: qty 1

## 2015-06-03 MED ORDER — CIPROFLOXACIN IN D5W 400 MG/200ML IV SOLN
400.0000 mg | INTRAVENOUS | Status: AC
  Administered 2015-06-04: 100 mg via INTRAVENOUS
  Filled 2015-06-03: qty 200

## 2015-06-04 ENCOUNTER — Ambulatory Visit (HOSPITAL_COMMUNITY)
Admission: RE | Admit: 2015-06-04 | Discharge: 2015-06-05 | Disposition: A | Discharge status: Home / Self Care | Payer: BLUE CROSS/BLUE SHIELD | Source: Ambulatory Visit | Attending: Plastic Surgery | Admitting: Plastic Surgery

## 2015-06-04 ENCOUNTER — Encounter (HOSPITAL_COMMUNITY)
Admission: RE | Disposition: A | Discharge status: Home / Self Care | Payer: Self-pay | Source: Ambulatory Visit | Attending: Plastic Surgery

## 2015-06-04 ENCOUNTER — Encounter (HOSPITAL_COMMUNITY): Payer: Self-pay | Admitting: Certified Registered Nurse Anesthetist

## 2015-06-04 ENCOUNTER — Ambulatory Visit (HOSPITAL_COMMUNITY): Payer: BLUE CROSS/BLUE SHIELD | Admitting: Certified Registered Nurse Anesthetist

## 2015-06-04 DIAGNOSIS — C50911 Malignant neoplasm of unspecified site of right female breast: Secondary | ICD-10-CM | POA: Diagnosis present

## 2015-06-04 DIAGNOSIS — Z9221 Personal history of antineoplastic chemotherapy: Secondary | ICD-10-CM | POA: Insufficient documentation

## 2015-06-04 DIAGNOSIS — M1990 Unspecified osteoarthritis, unspecified site: Secondary | ICD-10-CM | POA: Insufficient documentation

## 2015-06-04 DIAGNOSIS — K219 Gastro-esophageal reflux disease without esophagitis: Secondary | ICD-10-CM | POA: Diagnosis not present

## 2015-06-04 DIAGNOSIS — Z452 Encounter for adjustment and management of vascular access device: Secondary | ICD-10-CM | POA: Diagnosis not present

## 2015-06-04 DIAGNOSIS — Z9013 Acquired absence of bilateral breasts and nipples: Secondary | ICD-10-CM | POA: Diagnosis not present

## 2015-06-04 DIAGNOSIS — Z923 Personal history of irradiation: Secondary | ICD-10-CM | POA: Diagnosis not present

## 2015-06-04 HISTORY — PX: REMOVAL OF TISSUE EXPANDER AND PLACEMENT OF IMPLANT: SHX6457

## 2015-06-04 HISTORY — PX: PORT-A-CATH REMOVAL: SHX5289

## 2015-06-04 SURGERY — REMOVAL, TISSUE EXPANDER, BREAST, WITH IMPLANT INSERTION
Anesthesia: General | Site: Chest | Laterality: Right

## 2015-06-04 MED ORDER — FENTANYL CITRATE (PF) 100 MCG/2ML IJ SOLN
INTRAMUSCULAR | Status: DC | PRN
Start: 2015-06-04 — End: 2015-06-04
  Administered 2015-06-04: 150 ug via INTRAVENOUS

## 2015-06-04 MED ORDER — GLYCOPYRROLATE 0.2 MG/ML IJ SOLN
INTRAMUSCULAR | Status: DC | PRN
Start: 2015-06-04 — End: 2015-06-04
  Administered 2015-06-04: 0.6 mg via INTRAVENOUS

## 2015-06-04 MED ORDER — DOCUSATE SODIUM 100 MG PO CAPS
100.0000 mg | ORAL_CAPSULE | Freq: Every day | ORAL | Status: DC
  Administered 2015-06-04 – 2015-06-05 (×2): 100 mg via ORAL
  Filled 2015-06-04 (×2): qty 1

## 2015-06-04 MED ORDER — MIDAZOLAM HCL 5 MG/5ML IJ SOLN
INTRAMUSCULAR | Status: DC | PRN
  Administered 2015-06-04: 2 mg via INTRAVENOUS

## 2015-06-04 MED ORDER — VANCOMYCIN HCL IN DEXTROSE 750-5 MG/150ML-% IV SOLN
750.0000 mg | Freq: Two times a day (BID) | INTRAVENOUS | Status: DC
  Administered 2015-06-04: 750 mg via INTRAVENOUS
  Filled 2015-06-04 (×3): qty 150

## 2015-06-04 MED ORDER — DIPHENHYDRAMINE HCL 50 MG/ML IJ SOLN
INTRAMUSCULAR | Status: DC | PRN
  Administered 2015-06-04: 12.5 mg via INTRAVENOUS

## 2015-06-04 MED ORDER — HYDROMORPHONE HCL 1 MG/ML IJ SOLN
INTRAMUSCULAR | Status: AC
  Filled 2015-06-04: qty 1

## 2015-06-04 MED ORDER — OXYCODONE HCL 5 MG/5ML PO SOLN: 5.0000 mg | Freq: Once | ORAL | Status: AC | PRN

## 2015-06-04 MED ORDER — PROPOFOL 10 MG/ML IV BOLUS
INTRAVENOUS | Status: DC | PRN
  Administered 2015-06-04: 130 mg via INTRAVENOUS

## 2015-06-04 MED ORDER — SODIUM CHLORIDE 0.9 % IV SOLN
1000.0000 mg | INTRAVENOUS | Status: DC | PRN
  Administered 2015-06-04: 1000 mg via INTRAVENOUS

## 2015-06-04 MED ORDER — NEOSTIGMINE METHYLSULFATE 10 MG/10ML IV SOLN
INTRAVENOUS | Status: DC | PRN
  Administered 2015-06-04: 4 mg via INTRAVENOUS

## 2015-06-04 MED ORDER — HYDROMORPHONE HCL 2 MG PO TABS
2.0000 mg | ORAL_TABLET | ORAL | Status: DC | PRN
  Administered 2015-06-04: 2 mg via ORAL
  Administered 2015-06-04 – 2015-06-05 (×2): 4 mg via ORAL
  Filled 2015-06-04 (×2): qty 2
  Filled 2015-06-04: qty 1

## 2015-06-04 MED ORDER — ANASTROZOLE 1 MG PO TABS
1.0000 mg | ORAL_TABLET | Freq: Every day | ORAL | Status: DC
  Administered 2015-06-05: 1 mg via ORAL
  Filled 2015-06-04: qty 1

## 2015-06-04 MED ORDER — HYDROMORPHONE HCL 1 MG/ML IJ SOLN
0.2500 mg | INTRAMUSCULAR | Status: DC | PRN
  Administered 2015-06-04 (×2): 0.5 mg via INTRAVENOUS

## 2015-06-04 MED ORDER — ACETAMINOPHEN 325 MG PO TABS: 325.0000 mg | ORAL_TABLET | ORAL | Status: DC | PRN

## 2015-06-04 MED ORDER — ONDANSETRON HCL 4 MG/2ML IJ SOLN
INTRAMUSCULAR | Status: AC
  Filled 2015-06-04: qty 2

## 2015-06-04 MED ORDER — GLYCOPYRROLATE 0.2 MG/ML IJ SOLN
INTRAMUSCULAR | Status: AC
  Filled 2015-06-04: qty 3

## 2015-06-04 MED ORDER — VALACYCLOVIR HCL 500 MG PO TABS
500.0000 mg | ORAL_TABLET | Freq: Every day | ORAL | Status: DC
  Administered 2015-06-05: 500 mg via ORAL
  Filled 2015-06-04 (×2): qty 1

## 2015-06-04 MED ORDER — LACTATED RINGERS IV SOLN
INTRAVENOUS | Status: DC | PRN
  Administered 2015-06-04 (×2): via INTRAVENOUS

## 2015-06-04 MED ORDER — LIDOCAINE HCL (CARDIAC) 20 MG/ML IV SOLN
INTRAVENOUS | Status: AC
  Filled 2015-06-04: qty 5

## 2015-06-04 MED ORDER — DEXTROSE-NACL 5-0.45 % IV SOLN
INTRAVENOUS | Status: DC
  Administered 2015-06-04: 16:00:00 via INTRAVENOUS

## 2015-06-04 MED ORDER — PROPOFOL 10 MG/ML IV BOLUS
INTRAVENOUS | Status: AC
  Filled 2015-06-04: qty 20

## 2015-06-04 MED ORDER — ARTIFICIAL TEARS OP OINT
TOPICAL_OINTMENT | OPHTHALMIC | Status: AC
  Filled 2015-06-04: qty 3.5

## 2015-06-04 MED ORDER — ACETAMINOPHEN 160 MG/5ML PO SOLN: 325.0000 mg | ORAL | Status: DC | PRN

## 2015-06-04 MED ORDER — ROCURONIUM BROMIDE 50 MG/5ML IV SOLN
INTRAVENOUS | Status: AC
  Filled 2015-06-04: qty 1

## 2015-06-04 MED ORDER — MIDAZOLAM HCL 2 MG/2ML IJ SOLN
INTRAMUSCULAR | Status: AC
  Filled 2015-06-04: qty 2

## 2015-06-04 MED ORDER — OXYCODONE HCL 5 MG PO TABS
ORAL_TABLET | ORAL | Status: AC
  Filled 2015-06-04: qty 1

## 2015-06-04 MED ORDER — DEXAMETHASONE SODIUM PHOSPHATE 10 MG/ML IJ SOLN
INTRAMUSCULAR | Status: DC | PRN
  Administered 2015-06-04: 10 mg via INTRAVENOUS

## 2015-06-04 MED ORDER — VANCOMYCIN HCL IN DEXTROSE 1-5 GM/200ML-% IV SOLN
INTRAVENOUS | Status: AC
  Filled 2015-06-04: qty 200

## 2015-06-04 MED ORDER — PANTOPRAZOLE SODIUM 40 MG PO TBEC
40.0000 mg | DELAYED_RELEASE_TABLET | Freq: Every day | ORAL | Status: DC
  Administered 2015-06-04 – 2015-06-05 (×2): 40 mg via ORAL
  Filled 2015-06-04 (×2): qty 1

## 2015-06-04 MED ORDER — FENTANYL CITRATE (PF) 250 MCG/5ML IJ SOLN
INTRAMUSCULAR | Status: AC
  Filled 2015-06-04: qty 5

## 2015-06-04 MED ORDER — PROMETHAZINE HCL 25 MG/ML IJ SOLN: 6.2500 mg | INTRAMUSCULAR | Status: DC | PRN

## 2015-06-04 MED ORDER — 0.9 % SODIUM CHLORIDE (POUR BTL) OPTIME
TOPICAL | Status: DC | PRN
  Administered 2015-06-04 (×2): 1000 mL

## 2015-06-04 MED ORDER — ONDANSETRON HCL 4 MG/2ML IJ SOLN
INTRAMUSCULAR | Status: DC | PRN
Start: 2015-06-04 — End: 2015-06-04
  Administered 2015-06-04: 4 mg via INTRAVENOUS

## 2015-06-04 MED ORDER — ACETAMINOPHEN 500 MG PO TABS: 500.0000 mg | ORAL_TABLET | Freq: Four times a day (QID) | ORAL | Status: DC | PRN

## 2015-06-04 MED ORDER — ZOLPIDEM TARTRATE 5 MG PO TABS: 5.0000 mg | ORAL_TABLET | Freq: Every evening | ORAL | Status: DC | PRN

## 2015-06-04 MED ORDER — SODIUM CHLORIDE 0.9 % IR SOLN
Status: DC | PRN
  Administered 2015-06-04: 500 mL

## 2015-06-04 MED ORDER — OXYCODONE HCL 5 MG PO TABS
5.0000 mg | ORAL_TABLET | Freq: Once | ORAL | Status: AC | PRN
  Administered 2015-06-04: 5 mg via ORAL

## 2015-06-04 MED ORDER — ENOXAPARIN SODIUM 40 MG/0.4ML ~~LOC~~ SOLN
40.0000 mg | SUBCUTANEOUS | Status: DC
  Administered 2015-06-05: 40 mg via SUBCUTANEOUS
  Filled 2015-06-04: qty 0.4

## 2015-06-04 MED ORDER — METHOCARBAMOL 500 MG PO TABS
500.0000 mg | ORAL_TABLET | Freq: Four times a day (QID) | ORAL | Status: DC | PRN
  Administered 2015-06-04 – 2015-06-05 (×2): 500 mg via ORAL
  Filled 2015-06-04 (×2): qty 1

## 2015-06-04 MED ORDER — DIPHENHYDRAMINE HCL 50 MG/ML IJ SOLN
INTRAMUSCULAR | Status: AC
  Filled 2015-06-04: qty 1

## 2015-06-04 MED ORDER — LIDOCAINE HCL (CARDIAC) 20 MG/ML IV SOLN
INTRAVENOUS | Status: DC | PRN
  Administered 2015-06-04: 60 mg via INTRAVENOUS

## 2015-06-04 MED ORDER — DEXAMETHASONE SODIUM PHOSPHATE 10 MG/ML IJ SOLN
INTRAMUSCULAR | Status: AC
  Filled 2015-06-04: qty 1

## 2015-06-04 MED ORDER — ROCURONIUM BROMIDE 100 MG/10ML IV SOLN
INTRAVENOUS | Status: DC | PRN
  Administered 2015-06-04: 50 mg via INTRAVENOUS

## 2015-06-04 MED ORDER — NEOSTIGMINE METHYLSULFATE 10 MG/10ML IV SOLN
INTRAVENOUS | Status: AC
Start: 2015-06-04 — End: 2015-06-04
  Filled 2015-06-04: qty 1

## 2015-06-04 SURGICAL SUPPLY — 67 items
ADH SKN CLS APL DERMABOND .7 (GAUZE/BANDAGES/DRESSINGS) ×2
APPLIER CLIP 9.375 MED OPEN (MISCELLANEOUS) ×3
APR CLP MED 9.3 20 MLT OPN (MISCELLANEOUS) ×2
ATCH SMKEVC FLXB CAUT HNDSWH (FILTER) ×2 IMPLANT
BAG DECANTER FOR FLEXI CONT (MISCELLANEOUS) ×3 IMPLANT
BINDER BREAST XLRG (GAUZE/BANDAGES/DRESSINGS) ×1 IMPLANT
BIOPATCH RED 1 DISK 7.0 (GAUZE/BANDAGES/DRESSINGS) ×2 IMPLANT
BLADE 10 SAFETY STRL DISP (BLADE) ×3 IMPLANT
CANISTER SUCTION 2500CC (MISCELLANEOUS) ×3 IMPLANT
CHLORAPREP W/TINT 26ML (MISCELLANEOUS) ×3 IMPLANT
CLIP APPLIE 9.375 MED OPEN (MISCELLANEOUS) IMPLANT
COVER SURGICAL LIGHT HANDLE (MISCELLANEOUS) ×3 IMPLANT
DERMABOND ADVANCED (GAUZE/BANDAGES/DRESSINGS) ×1
DERMABOND ADVANCED .7 DNX12 (GAUZE/BANDAGES/DRESSINGS) ×2 IMPLANT
DRAIN CHANNEL 19F RND (DRAIN) ×2 IMPLANT
DRAPE ORTHO SPLIT 77X108 STRL (DRAPES) ×6
DRAPE PROXIMA HALF (DRAPES) ×2 IMPLANT
DRAPE SURG 17X23 STRL (DRAPES) ×12 IMPLANT
DRAPE SURG ORHT 6 SPLT 77X108 (DRAPES) ×4 IMPLANT
DRAPE WARM FLUID 44X44 (DRAPE) ×3 IMPLANT
DRSG PAD ABDOMINAL 8X10 ST (GAUZE/BANDAGES/DRESSINGS) ×5 IMPLANT
DRSG TEGADERM 4X4.75 (GAUZE/BANDAGES/DRESSINGS) IMPLANT
DRSG TELFA 3X8 NADH (GAUZE/BANDAGES/DRESSINGS) ×3 IMPLANT
ELECT BLADE 6.5 EXT (BLADE) IMPLANT
ELECT CAUTERY BLADE 6.4 (BLADE) ×3 IMPLANT
ELECT REM PT RETURN 9FT ADLT (ELECTROSURGICAL) ×3
ELECTRODE REM PT RTRN 9FT ADLT (ELECTROSURGICAL) ×2 IMPLANT
EVACUATOR SILICONE 100CC (DRAIN) ×2 IMPLANT
EVACUATOR SMOKE ACCUVAC VALLEY (FILTER) ×1
GLOVE BIO SURGEON STRL SZ7.5 (GLOVE) ×3 IMPLANT
GLOVE BIOGEL PI IND STRL 7.0 (GLOVE) IMPLANT
GLOVE BIOGEL PI IND STRL 7.5 (GLOVE) IMPLANT
GLOVE BIOGEL PI IND STRL 8 (GLOVE) ×2 IMPLANT
GLOVE BIOGEL PI INDICATOR 7.0 (GLOVE) ×1
GLOVE BIOGEL PI INDICATOR 7.5 (GLOVE) ×1
GLOVE BIOGEL PI INDICATOR 8 (GLOVE) ×1
GLOVE ECLIPSE 7.5 STRL STRAW (GLOVE) ×1 IMPLANT
GLOVE SURG SS PI 7.0 STRL IVOR (GLOVE) ×1 IMPLANT
GOWN STRL REUS W/ TWL LRG LVL3 (GOWN DISPOSABLE) ×2 IMPLANT
GOWN STRL REUS W/ TWL XL LVL3 (GOWN DISPOSABLE) ×2 IMPLANT
GOWN STRL REUS W/TWL LRG LVL3 (GOWN DISPOSABLE) ×6
GOWN STRL REUS W/TWL XL LVL3 (GOWN DISPOSABLE) ×3
IMPL SILICONE 600CC (Breast) IMPLANT
IMPLANT SILICONE 600CC (Breast) ×6 IMPLANT
KIT BASIN OR (CUSTOM PROCEDURE TRAY) ×3 IMPLANT
KIT ROOM TURNOVER OR (KITS) ×3 IMPLANT
MARKER SKIN DUAL TIP RULER LAB (MISCELLANEOUS) ×3 IMPLANT
NS IRRIG 1000ML POUR BTL (IV SOLUTION) ×6 IMPLANT
PACK GENERAL/GYN (CUSTOM PROCEDURE TRAY) ×3 IMPLANT
PAD ABD 8X10 STRL (GAUZE/BANDAGES/DRESSINGS) ×4 IMPLANT
PAD ARMBOARD 7.5X6 YLW CONV (MISCELLANEOUS) ×3 IMPLANT
PAD DRESSING TELFA 3X8 NADH (GAUZE/BANDAGES/DRESSINGS) IMPLANT
PREFILTER EVAC NS 1 1/3-3/8IN (MISCELLANEOUS) ×3 IMPLANT
SUT ETHIBOND 2 0 SH (SUTURE) IMPLANT
SUT MNCRL AB 3-0 PS2 18 (SUTURE) ×4 IMPLANT
SUT PDS 2 0 CTB 1 36 (SUTURE) IMPLANT
SUT PDS AB 2-0 CT1 27 (SUTURE) ×4 IMPLANT
SUT PDS AB 3-0 SH 27 (SUTURE) IMPLANT
SUT PROLENE 3 0 PS 2 (SUTURE) ×3 IMPLANT
SUT VIC AB 2-0 SH 27 (SUTURE) ×3
SUT VIC AB 2-0 SH 27X BRD (SUTURE) IMPLANT
SUT VIC AB 3-0 SH 18 (SUTURE) ×3 IMPLANT
SYR BULB IRRIGATION 50ML (SYRINGE) ×3 IMPLANT
TOWEL OR 17X24 6PK STRL BLUE (TOWEL DISPOSABLE) ×2 IMPLANT
TOWEL OR 17X26 10 PK STRL BLUE (TOWEL DISPOSABLE) ×3 IMPLANT
TRAY FOLEY CATH 14FRSI W/METER (CATHETERS) IMPLANT
TUBE CONNECTING 12X1/4 (SUCTIONS) ×3 IMPLANT

## 2015-06-04 NOTE — Progress Notes (Signed)
Pt experiencing a cold at this time. Went to urgent care and they did a CXR, strep test and flu check and had no fever.  They cleared her for surgery.  Pt called Dr Harlow Mares and he is aware.  States has head cold and cough.

## 2015-06-04 NOTE — Progress Notes (Signed)
ANTIBIOTIC CONSULT NOTE - INITIAL  Pharmacy Consult:  Vancomycin Indication:  Surgical prophylaxis  Allergies  Allergen Reactions  . Penicillins Hives    Has patient had a PCN reaction causing immediate rash, facial/tongue/throat swelling, SOB or lightheadedness with hypotension: Yes Has patient had a PCN reaction causing severe rash involving mucus membranes or skin necrosis: No Has patient had a PCN reaction that required hospitalization: No Has patient had a PCN reaction occurring within the last 10 years: No If all of the above answers are "NO", then may proceed with Cephalosporin use.     Patient Measurements: Height: 5' 6.5" (168.9 cm) Weight: 158 lb (71.668 kg) IBW/kg (Calculated) : 60.45  Vital Signs: Temp: 98 F (36.7 C) (12/29 1110) Temp Source: Oral (12/29 0605) BP: 109/72 mmHg (12/29 1110) Pulse Rate: 61 (12/29 1110) Intake/Output from this shift: Total I/O In: 1200 [I.V.:1200] Out: 27 [Drains:27]  Labs: No results for input(s): WBC, HGB, PLT, LABCREA, CREATININE in the last 72 hours. Estimated Creatinine Clearance: 78.6 mL/min (by C-G formula based on Cr of 0.76). No results for input(s): VANCOTROUGH, VANCOPEAK, VANCORANDOM, GENTTROUGH, GENTPEAK, GENTRANDOM, TOBRATROUGH, TOBRAPEAK, TOBRARND, AMIKACINPEAK, AMIKACINTROU, AMIKACIN in the last 72 hours.   Microbiology: No results found for this or any previous visit (from the past 720 hour(s)).  Medical History: Past Medical History  Diagnosis Date  . Bronchitis 2016    treated /w IV antibiotics, admitted for 2 nights [facility]  . GERD (gastroesophageal reflux disease)   . Neuropathy ([place])     both feet since chemotherapy  . Breast cancer ([place])     R breast- 2015  . Arthritis     fingers stiff      Assessment: 60 YOM with history of breast cancer admitted for delayed breast reconstruction.  Pharmacy consulted to continue vancomycin post-op for surgical prophylaxis.  She received vancomycin 1gm around 0730  today.  Pre-op labs and home meds reviewed.  Vanc 12/29 >> Valacyclovir from PTA   Goal of Therapy:  Vancomycin trough level 10-15 mcg/ml   Plan:  - Vanc 750mg  IV Q12H - Monitor renal fxn, clinical progress, vanc trough if indicated - BMET in AM    Orra Nolde D. Mina Marble, PharmD, BCPS Pager:  [phone] 06/04/2015, 12:25 PM

## 2015-06-04 NOTE — H&P (Signed)
I have re-examined and re-evaluated the patient. She has had a URI in the interim but has recovered. There are no changes. See office notes in paper chart for H&P.

## 2015-06-04 NOTE — Anesthesia Procedure Notes (Signed)
Procedure Name: Intubation Date/Time: 06/04/2015 7:36 AM Performed by: Willeen Cass P Pre-anesthesia Checklist: Patient identified, Timeout performed, Emergency Drugs available, Suction available and Patient being monitored Patient Re-evaluated:Patient Re-evaluated prior to inductionOxygen Delivery Method: Circle system utilized Preoxygenation: Pre-oxygenation with 100% oxygen Intubation Type: IV induction Ventilation: Mask ventilation without difficulty Laryngoscope Size: Mac and 3 Grade View: Grade I Tube type: Oral Tube size: 7.0 mm Number of attempts: 1 Airway Equipment and Method: Stylet Placement Confirmation: ETT inserted through vocal cords under direct vision,  breath sounds checked- equal and bilateral and positive ETCO2 Secured at: 22 cm Tube secured with: Tape Dental Injury: Teeth and Oropharynx as per pre-operative assessment

## 2015-06-04 NOTE — Op Note (Signed)
NAMETERRIANA, KNOFF NO.:  [id number]  MEDICAL RECORD NO.:  [id number]  LOCATION:  [facility]                         FACILITY:  [facility]  PHYSICIAN:  Crissie Reese, M.D.     DATE OF BIRTH:  02-Aug-1960  DATE OF PROCEDURE:  06/04/2015 DATE OF DISCHARGE:                              OPERATIVE REPORT   PREOPERATIVE DIAGNOSIS:  Breast cancer.  POSTOPERATIVE DIAGNOSIS:  Breast cancer.  PROCEDURE PERFORMED: 1. Right delayed breast reconstruction with removal of tissue expander     and placement of silicone gel implant. 2. As a distinct procedure, a left delayed breast reconstruction with     removal of tissue expander and placement of silicone gel implant. 3. As a distinct procedure, a removal of Port-A-Cath.  SURGEON:  Crissie Reese, M.D.  ASSISTANT:  Judyann Munson, RNFA.  ANESTHESIA:  General.  ESTIMATED BLOOD LOSS:  25 mL.  DRAINS:  One 19-French from each side.  CLINICAL NOTE:  A 54 year old woman has had breast cancer and had bilateral nipple-sparing mastectomy with placement of tissue expanders. She subsequently had chemotherapy radiation treatment and is now several months out from her radiation treatment and now presents for removal of her tissue expanders and placement of silicone gel implants.  In addition, she needs capsulorrhaphy on the left side.  She also would have her Port-A-Cath removed and her medical oncologist has agreed with that.  The nature of the procedures and risks were discussed in a great detail.  She understands that there was added risk on the right side, due to the previous radiation.  She understood that this side would have increased risk for capsular contracture as well as healing problems and infection.  The risks were discussed and included, but not limited to, bleeding, infection, healing problems, scarring, loss of sensation, loss of sensation in the nipple, fluid accumulations, loss of tissue, pneumothorax, DVT, PE, failure  of the devices, capsular contracture, displacement of the device, wrinkles and ripples, asymmetry, contour deformities, and chronic pain as well as overall disappointment.  She understood all of this and wished to proceed.  DESCRIPTION OF PROCEDURE:  The patient was marked in the holding area for the surgery including the capsulorrhaphy on the left side of the inferior aspect.  She was taken to the operating room and placed supine. After successful general anesthesia, she was prepped with ChloraPrep and after waiting the full 3 minutes for drying, she was draped with sterile drapes.  The old inframammary crease scars were utilized.  Dissection carried down through the subcutaneous tissue.  The underlying tissue expanders were identified and deflated.  There were then gently removed. The spaces were inspected and capsule was noted to be in excellent condition without any visible abnormalities.  A thorough irrigation with saline as well as antibiotic solution.  On the left side, a capsulorrhaphy was performed removing some of the inferior capsule as it had been marked externally, preoperatively with the patient in a standing position.  This was then repaired as the capsulorrhaphy using 2- 0 PDS interrupted inverted deep sutures and 2-0 Vicryl running simple suture for reinforcement.  A mirror image capsulotomy was performed superiorly in order to allow the implant to move  a little bit superior once placed.  After thoroughly cleaning gloves, the implants were prepared.  There were first soaked in antibiotic solution.  The skin was prepped with Betadine.  Two 19-French drains were positioned, brought through separate stab wound inferiorly and secured with 3-0 Prolene sutures.  The implants were then positioned and care was taken to make there were oriented properly.  Antibiotic solution was placed again just prior to placement of the implants.  Antibiotic solution was again used on the  implant and then the closure of the capsule with 3-0 Vicryl simple interrupted sutures with great care taken to avoid damage to underlying implants, which were kept under direct vision all times. Antibiotic solution again for irrigation and the remainder of the closure with 3-0 Monocryl inverted deep dermal suture, running 3-0 Monocryl subcuticular suture on the left and simple interrupted 3-0 Prolene sutures on the right side, where she had a previous radiation. The drains were dressed with Biopatch and SorbaView dressings.  Attention was directed to the Port-A-Cath.  Incision was then made.  The dissection was carried down through subcutaneous tissue.  The Port-A- Cath was identified, sutures released, and gently mobilized.  As the Anesthesia gave a full deep breath and held it, the Port-A-Cath was gently removed and steady pressure was maintained for several minutes. There was no evidence any bleeding.  Irrigation with antibiotic solution.  Cautery was used for the capsule that had been around the Port-A-Cath.  That closure was then performed with 3-0 Monocryl inverted dermal sutures, running 3-0 Monocryl subcuticular suture.  Total length of complex wound closure was 3.5 cm.  The Dermabond then applied.  Dry sterile dressings, the chest vest was positioned, and she was transferred to the recovery room stable having tolerating the procedure well.     Crissie Reese, M.D.     DB/MEDQ  D:  06/04/2015  T:  06/04/2015  Job:  [id number]

## 2015-06-04 NOTE — Transfer of Care (Signed)
Immediate Anesthesia Transfer of Care Note  Patient: Olivia Acosta  Procedure(s) Performed: Procedure(s): REMOVAL OF BILATERAL TISSUE EXPANDER AND DELAYED BREAST RECONSTRUCTION PLACEMENT OF IMPLANTS (Bilateral) REMOVAL PORT-A-CATH (Right)  Patient Location: PACU  Anesthesia Type:General  Level of Consciousness: awake, alert , oriented and patient cooperative  Airway & Oxygen Therapy: Patient Spontanous Breathing and Patient connected to nasal cannula oxygen  Post-op Assessment: Report given to RN and Post -op Vital signs reviewed and stable  Post vital signs: Reviewed and stable  Last Vitals:  Filed Vitals:   06/04/15 0605 06/04/15 0937  BP: 112/65 113/65  Pulse: 101   Temp: 36.3 C 36.2 C  Resp: 16 14    Complications: No apparent anesthesia complications

## 2015-06-04 NOTE — Anesthesia Preprocedure Evaluation (Signed)
Anesthesia Evaluation  Patient identified by MRN, date of birth, ID band Patient awake    Reviewed: Allergy & Precautions, NPO status , Patient's Chart, lab work & pertinent test results  History of Anesthesia Complications Negative for: history of anesthetic complications  Airway Mallampati: II  TM Distance: >3 FB Neck ROM: Full    Dental  (+) Teeth Intact   Pulmonary Recent URI , Residual Cough,    breath sounds clear to auscultation       Cardiovascular negative cardio ROS   Rhythm:Regular     Neuro/Psych PSYCHIATRIC DISORDERS Depression negative neurological ROS     GI/Hepatic Neg liver ROS, GERD  Medicated and Controlled,  Endo/Other  negative endocrine ROS  Renal/GU negative Renal ROS     Musculoskeletal  (+) Arthritis ,   Abdominal   Peds  Hematology negative hematology ROS (+)   Anesthesia Other Findings   Reproductive/Obstetrics                             Anesthesia Physical Anesthesia Plan  ASA: II  Anesthesia Plan: General   Post-op Pain Management:    Induction: Intravenous  Airway Management Planned: Oral ETT  Additional Equipment: None  Intra-op Plan:   Post-operative Plan: Extubation in OR  Informed Consent: I have reviewed the patients History and Physical, chart, labs and discussed the procedure including the risks, benefits and alternatives for the proposed anesthesia with the patient or authorized representative who has indicated his/her understanding and acceptance.   Dental advisory given  Plan Discussed with: CRNA and Surgeon  Anesthesia Plan Comments:         Anesthesia Quick Evaluation

## 2015-06-04 NOTE — Brief Op Note (Signed)
06/04/2015  9:50 AM  PATIENT:  Olivia Acosta  54 y.o. female  PRE-OPERATIVE DIAGNOSIS:  BREAST CANCER  POST-OPERATIVE DIAGNOSIS:  BREAST CANCER  PROCEDURE:  Procedure(s): REMOVAL OF BILATERAL TISSUE EXPANDER AND DELAYED BREAST RECONSTRUCTION PLACEMENT OF IMPLANTS (Bilateral) REMOVAL PORT-A-CATH (Right)  SURGEON:  Surgeon(s) and Role:    * Crissie Reese, MD - Primary  PHYSICIAN ASSISTANT:   ASSISTANTS: Judyann Munson, RNFA   ANESTHESIA:   general  EBL:  Total I/O In: 1200 [I.V.:1200] Out: -   BLOOD ADMINISTERED:none  DRAINS: (2) [name]-Pratt drain(s) with closed bulb suction in the right cheat (1) and left chest (1)   LOCAL MEDICATIONS USED:  NONE  SPECIMEN:  No Specimen  DISPOSITION OF SPECIMEN:  N/A  COUNTS:  YES  TOURNIQUET:  * No tourniquets in log *  DICTATION: .Other Dictation: Dictation Number [id number]  PLAN OF CARE: Admit for overnight observation  PATIENT DISPOSITION:  PACU - hemodynamically stable.   Delay start of Pharmacological VTE agent (>24hrs) due to surgical blood loss or risk of bleeding: no

## 2015-06-05 ENCOUNTER — Encounter (HOSPITAL_COMMUNITY): Payer: Self-pay | Admitting: Plastic Surgery

## 2015-06-05 DIAGNOSIS — C50911 Malignant neoplasm of unspecified site of right female breast: Secondary | ICD-10-CM | POA: Diagnosis not present

## 2015-06-05 LAB — BASIC METABOLIC PANEL
ANION GAP: 10 (ref 5–15)
BUN: 5 mg/dL — ABNORMAL LOW (ref 6–20)
CHLORIDE: 106 mmol/L (ref 101–111)
CO2: 26 mmol/L (ref 22–32)
Calcium: 8.9 mg/dL (ref 8.9–10.3)
Creatinine, Ser: 0.74 mg/dL (ref 0.44–1.00)
GFR calc non Af Amer: >60 mL/min (ref >60)
Glucose, Bld: 105 mg/dL — ABNORMAL HIGH (ref 65–99)
POTASSIUM: 3.9 mmol/L (ref 3.5–5.1)
SODIUM: 142 mmol/L (ref 135–145)

## 2015-06-05 MED ORDER — ENOXAPARIN SODIUM 40 MG/0.4ML ~~LOC~~ SOLN: 40.0000 mg | SUBCUTANEOUS | Status: DC

## 2015-06-05 MED ORDER — METHOCARBAMOL 500 MG PO TABS: 500.0000 mg | ORAL_TABLET | Freq: Four times a day (QID) | ORAL | Status: DC | PRN

## 2015-06-05 MED ORDER — HYDROMORPHONE HCL 2 MG PO TABS: 2.0000 mg | ORAL_TABLET | ORAL | Status: DC | PRN

## 2015-06-05 MED ORDER — SULFAMETHOXAZOLE-TRIMETHOPRIM 800-160 MG PO TABS: 1.0000 | ORAL_TABLET | Freq: Two times a day (BID) | ORAL | Status: DC

## 2015-06-05 MED ORDER — DOCUSATE SODIUM 100 MG PO CAPS: 100.0000 mg | ORAL_CAPSULE | Freq: Every day | ORAL | Status: DC

## 2015-06-05 MED ORDER — SULFAMETHOXAZOLE-TRIMETHOPRIM 800-160 MG PO TABS
1.0000 | ORAL_TABLET | Freq: Two times a day (BID) | ORAL | Status: DC
  Administered 2015-06-05: 1 via ORAL
  Filled 2015-06-05: qty 1

## 2015-06-05 NOTE — Discharge Summary (Signed)
Physician Discharge Summary  Patient ID: Olivia Acosta MRN: [id number] DOB/AGE: Dec 30, 1960 54 y.o.  Admit date: 06/04/2015 Discharge date: 06/05/2015  Admission Diagnoses:Right breast cancer  Discharge Diagnoses:  Same Active Problems:   Breast cancer, right breast [facility])   Discharged Condition: good  Hospital Course: On the day of admission the patient was taken to surgery and had bilateral breast reconstruction with silicone gel implants and removal of portacath. The patient tolerated the procedures well. Postoperatively, both sides are soft and no evidence of bleeding or infection The patient was ambulatory and tolerating diet on the first postoperative day. She is ready  For discharge.  Treatments: antibiotics: vancomycin, anticoagulation: LMW heparin and surgery: bilateral breast reconstruction with implants and removal of portacath  Discharge Exam: Blood pressure 103/61, pulse 81, temperature 98.3 F (36.8 C), temperature source Oral, resp. rate 18, height 5' 6.5" (1.689 m), weight 158 lb (71.668 kg), last menstrual period 05/28/2011, SpO2 99 %.  Operative sites: Mastectomy flaps viable. Implants are in good position. Drains functioning. Drainage thin.  Disposition: 01-Home or Self Care     Medication List    STOP taking these medications        loratadine-pseudoephedrine 5-120 MG tablet  Commonly known as:  CLARITIN-D 12-hour      TAKE these medications        acetaminophen 500 MG tablet  Commonly known as:  TYLENOL  Take 500 mg by mouth every 6 (six) hours as needed.     anastrozole 1 MG tablet  Commonly known as:  ARIMIDEX  Take 1 tablet (1 mg total) by mouth daily.     docusate sodium 100 MG capsule  Commonly known as:  COLACE  Take 1 capsule (100 mg total) by mouth daily.     enoxaparin 40 MG/0.4ML injection  Commonly known as:  LOVENOX  Inject 0.4 mLs (40 mg total) into the skin daily.     esomeprazole 20 MG capsule  Commonly known as:  NEXIUM   Take 1 capsule (20 mg total) by mouth daily at 12 noon.     HYDROmorphone 2 MG tablet  Commonly known as:  DILAUDID  Take 1-2 tablets (2-4 mg total) by mouth every 4 (four) hours as needed for moderate pain or severe pain.     methocarbamol 500 MG tablet  Commonly known as:  ROBAXIN  Take 1 tablet (500 mg total) by mouth every 6 (six) hours as needed for muscle spasms.     sulfamethoxazole-trimethoprim 800-160 MG tablet  Commonly known as:  BACTRIM DS,SEPTRA DS  Take 1 tablet by mouth every 12 (twelve) hours.     valACYclovir 500 MG tablet  Commonly known as:  VALTREX  Take 500 mg by mouth daily before breakfast.     zolpidem 10 MG tablet  Commonly known as:  AMBIEN  Take 10 mg by mouth at bedtime as needed for sleep.         SignedHarlow Mares, Andra Heslin M 06/05/2015, 8:23 AM

## 2015-06-05 NOTE — Discharge Instructions (Addendum)
No lifting for 6 weeks No vigorous activity for 6 weeks (including outdoor walks) No driving for 4 weeks OK to walk up stairs slowly Stay propped up Use incentive spirometer at home every hour while awake No shower while drains are in place Empty drains at least three times a day and record the amounts separately Take an over-the-counter Probiotic while on antibiotics Take an over-the-counter stool softener (such as Colace) while on pain medication Call Tuesday morning and report drain output. Drains will probably need removal on Tuesday. For questions call [phone] or [phone]

## 2015-06-05 NOTE — Discharge Planning (Addendum)
AVS to patient who verbalizes understanding, able to demo drain care. Pt's rx were called except for pain rx which she has.  Will dc to private car home accompanied by her Mom at 1025.

## 2015-06-09 NOTE — Anesthesia Postprocedure Evaluation (Signed)
Anesthesia Post Note  Patient: Hydrographic surveyor  Procedure(s) Performed: Procedure(s) (LRB): REMOVAL OF BILATERAL TISSUE EXPANDER AND DELAYED BREAST RECONSTRUCTION PLACEMENT OF IMPLANTS (Bilateral) REMOVAL PORT-A-CATH (Right)  Patient location during evaluation: PACU Anesthesia Type: General Level of consciousness: awake Pain management: pain level controlled Vital Signs Assessment: post-procedure vital signs reviewed and stable Respiratory status: spontaneous breathing Cardiovascular status: stable Postop Assessment: no signs of nausea or vomiting Anesthetic complications: no    Last Vitals:  Filed Vitals:   06/05/15 0610 06/05/15 0900  BP:  105/62  Pulse:  81  Temp: 36.8 C 36.8 C  Resp:  18    Last Pain:  Filed Vitals:   06/05/15 1013  PainSc: 0-No pain                 Dominigue Gellner

## 2015-07-22 ENCOUNTER — Other Ambulatory Visit (HOSPITAL_COMMUNITY): Payer: Self-pay | Admitting: Plastic Surgery

## 2015-07-22 ENCOUNTER — Encounter (HOSPITAL_COMMUNITY): Payer: Self-pay | Admitting: *Deleted

## 2015-07-22 NOTE — Progress Notes (Signed)
Pt denies cardiac history, chest pain or sob.   She states she had flu like symptoms last week. States all symptoms have been gone since Friday, 07/17/15.

## 2015-07-23 ENCOUNTER — Inpatient Hospital Stay (HOSPITAL_COMMUNITY): Payer: BLUE CROSS/BLUE SHIELD | Admitting: Anesthesiology

## 2015-07-23 ENCOUNTER — Encounter (HOSPITAL_COMMUNITY)
Admission: RE | Disposition: A | Discharge status: Home / Self Care | Payer: Self-pay | Source: Ambulatory Visit | Attending: Plastic Surgery

## 2015-07-23 ENCOUNTER — Encounter (HOSPITAL_COMMUNITY): Payer: Self-pay | Admitting: *Deleted

## 2015-07-23 ENCOUNTER — Inpatient Hospital Stay (HOSPITAL_COMMUNITY)
Admission: RE | Admit: 2015-07-23 | Discharge: 2015-07-25 | DRG: 909 | Disposition: A | Discharge status: Home / Self Care | Payer: BLUE CROSS/BLUE SHIELD | Source: Ambulatory Visit | Attending: Plastic Surgery | Admitting: Plastic Surgery

## 2015-07-23 DIAGNOSIS — Y842 Radiological procedure and radiotherapy as the cause of abnormal reaction of the patient, or of later complication, without mention of misadventure at the time of the procedure: Secondary | ICD-10-CM | POA: Diagnosis present

## 2015-07-23 DIAGNOSIS — T8130XA Disruption of wound, unspecified, initial encounter: Principal | ICD-10-CM | POA: Diagnosis present

## 2015-07-23 DIAGNOSIS — Z853 Personal history of malignant neoplasm of breast: Secondary | ICD-10-CM

## 2015-07-23 DIAGNOSIS — C50919 Malignant neoplasm of unspecified site of unspecified female breast: Secondary | ICD-10-CM | POA: Diagnosis present

## 2015-07-23 DIAGNOSIS — T85898A Other specified complication of other internal prosthetic devices, implants and grafts, initial encounter: Secondary | ICD-10-CM | POA: Diagnosis present

## 2015-07-23 HISTORY — PX: BREAST RECONSTRUCTION: SHX9

## 2015-07-23 HISTORY — PX: LATISSIMUS FLAP TO BREAST: SHX5357

## 2015-07-23 LAB — BASIC METABOLIC PANEL
ANION GAP: 13 (ref 5–15)
BUN: 13 mg/dL (ref 6–20)
CHLORIDE: 102 mmol/L (ref 101–111)
CO2: 24 mmol/L (ref 22–32)
CREATININE: 0.93 mg/dL (ref 0.44–1.00)
Calcium: 9.5 mg/dL (ref 8.9–10.3)
GFR calc non Af Amer: >60 mL/min (ref >60)
Glucose, Bld: 89 mg/dL (ref 65–99)
Potassium: 4.3 mmol/L (ref 3.5–5.1)
Sodium: 139 mmol/L (ref 135–145)

## 2015-07-23 LAB — CBC
HCT: 36.6 % (ref 36.0–46.0)
HEMOGLOBIN: 11.9 g/dL — AB (ref 12.0–15.0)
MCH: 29.5 pg (ref 26.0–34.0)
MCHC: 32.5 g/dL (ref 30.0–36.0)
MCV: 90.8 fL (ref 78.0–100.0)
Platelets: 291 10*3/uL (ref 150–400)
RBC: 4.03 MIL/uL (ref 3.87–5.11)
RDW: 13.1 % (ref 11.5–15.5)
WBC: 4.8 10*3/uL (ref 4.0–10.5)

## 2015-07-23 SURGERY — RECONSTRUCTION, BREAST
Anesthesia: General | Site: Breast | Laterality: Right

## 2015-07-23 MED ORDER — LACTATED RINGERS IV SOLN
INTRAVENOUS | Status: DC
  Administered 2015-07-23: 13:00:00 via INTRAVENOUS

## 2015-07-23 MED ORDER — DEXAMETHASONE SODIUM PHOSPHATE 10 MG/ML IJ SOLN
INTRAMUSCULAR | Status: DC | PRN
  Administered 2015-07-23: 4 mg via INTRAVENOUS

## 2015-07-23 MED ORDER — MIDAZOLAM HCL 5 MG/5ML IJ SOLN
INTRAMUSCULAR | Status: DC | PRN
  Administered 2015-07-23: 2 mg via INTRAVENOUS

## 2015-07-23 MED ORDER — EPHEDRINE SULFATE 50 MG/ML IJ SOLN
INTRAMUSCULAR | Status: DC | PRN
  Administered 2015-07-23 (×2): 10 mg via INTRAVENOUS

## 2015-07-23 MED ORDER — ROCURONIUM BROMIDE 100 MG/10ML IV SOLN
INTRAVENOUS | Status: DC | PRN
  Administered 2015-07-23: 10 mg via INTRAVENOUS
  Administered 2015-07-23: 50 mg via INTRAVENOUS

## 2015-07-23 MED ORDER — VALACYCLOVIR HCL 500 MG PO TABS
500.0000 mg | ORAL_TABLET | Freq: Every day | ORAL | Status: DC
  Administered 2015-07-24 – 2015-07-25 (×2): 500 mg via ORAL
  Filled 2015-07-23 (×3): qty 1

## 2015-07-23 MED ORDER — DIPHENHYDRAMINE HCL 50 MG/ML IJ SOLN
INTRAMUSCULAR | Status: DC | PRN
  Administered 2015-07-23: 25 mg via INTRAVENOUS

## 2015-07-23 MED ORDER — PHENYLEPHRINE HCL 10 MG/ML IJ SOLN
INTRAMUSCULAR | Status: DC | PRN
  Administered 2015-07-23 (×2): 40 ug via INTRAVENOUS
  Administered 2015-07-23: 80 ug via INTRAVENOUS
  Administered 2015-07-23 (×4): 40 ug via INTRAVENOUS
  Administered 2015-07-23: 80 ug via INTRAVENOUS

## 2015-07-23 MED ORDER — PANTOPRAZOLE SODIUM 40 MG PO TBEC
40.0000 mg | DELAYED_RELEASE_TABLET | Freq: Every day | ORAL | Status: DC
  Administered 2015-07-24 – 2015-07-25 (×2): 40 mg via ORAL
  Filled 2015-07-23 (×2): qty 1

## 2015-07-23 MED ORDER — LIDOCAINE HCL (CARDIAC) 20 MG/ML IV SOLN
INTRAVENOUS | Status: AC
  Filled 2015-07-23: qty 5

## 2015-07-23 MED ORDER — INFLUENZA VAC SPLIT QUAD 0.5 ML IM SUSY: 0.5000 mL | PREFILLED_SYRINGE | INTRAMUSCULAR | Status: DC

## 2015-07-23 MED ORDER — ACETAMINOPHEN 500 MG PO TABS: 500.0000 mg | ORAL_TABLET | Freq: Four times a day (QID) | ORAL | Status: DC | PRN

## 2015-07-23 MED ORDER — SCOPOLAMINE 1 MG/3DAYS TD PT72
1.0000 | MEDICATED_PATCH | TRANSDERMAL | Status: DC
  Filled 2015-07-23: qty 1

## 2015-07-23 MED ORDER — LACTATED RINGERS IV SOLN
INTRAVENOUS | Status: DC | PRN
  Administered 2015-07-23 (×2): via INTRAVENOUS

## 2015-07-23 MED ORDER — SUCCINYLCHOLINE CHLORIDE 20 MG/ML IJ SOLN
INTRAMUSCULAR | Status: AC
  Filled 2015-07-23: qty 1

## 2015-07-23 MED ORDER — HYDROMORPHONE HCL 2 MG PO TABS
2.0000 mg | ORAL_TABLET | ORAL | Status: DC | PRN
  Administered 2015-07-24 – 2015-07-25 (×4): 4 mg via ORAL
  Filled 2015-07-23 (×4): qty 2

## 2015-07-23 MED ORDER — HEPARIN SODIUM (PORCINE) 5000 UNIT/ML IJ SOLN
5000.0000 [IU] | Freq: Three times a day (TID) | INTRAMUSCULAR | Status: DC
  Administered 2015-07-24 – 2015-07-25 (×4): 5000 [IU] via SUBCUTANEOUS
  Filled 2015-07-23 (×4): qty 1

## 2015-07-23 MED ORDER — FENTANYL CITRATE (PF) 250 MCG/5ML IJ SOLN
INTRAMUSCULAR | Status: AC
  Filled 2015-07-23: qty 5

## 2015-07-23 MED ORDER — VANCOMYCIN HCL IN DEXTROSE 1-5 GM/200ML-% IV SOLN
1000.0000 mg | Freq: Two times a day (BID) | INTRAVENOUS | Status: DC
  Administered 2015-07-24 – 2015-07-25 (×3): 1000 mg via INTRAVENOUS
  Filled 2015-07-23 (×4): qty 200

## 2015-07-23 MED ORDER — VANCOMYCIN HCL IN DEXTROSE 1-5 GM/200ML-% IV SOLN
1000.0000 mg | INTRAVENOUS | Status: AC
  Administered 2015-07-23: 1000 mg via INTRAVENOUS
  Filled 2015-07-23: qty 200

## 2015-07-23 MED ORDER — PROMETHAZINE HCL 25 MG/ML IJ SOLN: 6.2500 mg | INTRAMUSCULAR | Status: DC | PRN

## 2015-07-23 MED ORDER — MIDAZOLAM HCL 2 MG/2ML IJ SOLN
INTRAMUSCULAR | Status: AC
  Filled 2015-07-23: qty 2

## 2015-07-23 MED ORDER — HYDROMORPHONE HCL 1 MG/ML IJ SOLN
0.2500 mg | INTRAMUSCULAR | Status: DC | PRN
  Administered 2015-07-23 (×2): 0.5 mg via INTRAVENOUS

## 2015-07-23 MED ORDER — ANASTROZOLE 1 MG PO TABS
1.0000 mg | ORAL_TABLET | Freq: Every day | ORAL | Status: DC
  Administered 2015-07-24 – 2015-07-25 (×2): 1 mg via ORAL
  Filled 2015-07-23 (×2): qty 1

## 2015-07-23 MED ORDER — LIDOCAINE HCL (CARDIAC) 20 MG/ML IV SOLN
INTRAVENOUS | Status: DC | PRN
  Administered 2015-07-23: 80 mg via INTRAVENOUS

## 2015-07-23 MED ORDER — PROPOFOL 10 MG/ML IV BOLUS
INTRAVENOUS | Status: AC
  Filled 2015-07-23: qty 20

## 2015-07-23 MED ORDER — PROPOFOL 10 MG/ML IV BOLUS
INTRAVENOUS | Status: DC | PRN
  Administered 2015-07-23: 200 mg via INTRAVENOUS

## 2015-07-23 MED ORDER — ROCURONIUM BROMIDE 50 MG/5ML IV SOLN
INTRAVENOUS | Status: AC
  Filled 2015-07-23: qty 1

## 2015-07-23 MED ORDER — HEPARIN SODIUM (PORCINE) 5000 UNIT/ML IJ SOLN
5000.0000 [IU] | Freq: Once | INTRAMUSCULAR | Status: AC
  Administered 2015-07-23: 5000 [IU] via SUBCUTANEOUS
  Filled 2015-07-23: qty 1

## 2015-07-23 MED ORDER — FENTANYL CITRATE (PF) 100 MCG/2ML IJ SOLN
INTRAMUSCULAR | Status: DC | PRN
  Administered 2015-07-23: 50 ug via INTRAVENOUS
  Administered 2015-07-23: 100 ug via INTRAVENOUS
  Administered 2015-07-23 (×2): 50 ug via INTRAVENOUS

## 2015-07-23 MED ORDER — METOCLOPRAMIDE HCL 5 MG/ML IJ SOLN
INTRAMUSCULAR | Status: DC | PRN
  Administered 2015-07-23: 10 mg via INTRAVENOUS

## 2015-07-23 MED ORDER — HYDROMORPHONE HCL 1 MG/ML IJ SOLN
0.5000 mg | INTRAMUSCULAR | Status: DC | PRN
  Administered 2015-07-23 – 2015-07-24 (×2): 1 mg via INTRAVENOUS
  Filled 2015-07-23 (×2): qty 1

## 2015-07-23 MED ORDER — METHOCARBAMOL 500 MG PO TABS: 500.0000 mg | ORAL_TABLET | Freq: Four times a day (QID) | ORAL | Status: DC | PRN

## 2015-07-23 MED ORDER — DOCUSATE SODIUM 100 MG PO CAPS
100.0000 mg | ORAL_CAPSULE | Freq: Every day | ORAL | Status: DC
Start: 2015-07-24 — End: 2015-07-25
  Administered 2015-07-24 – 2015-07-25 (×2): 100 mg via ORAL
  Filled 2015-07-23 (×2): qty 1

## 2015-07-23 MED ORDER — 0.9 % SODIUM CHLORIDE (POUR BTL) OPTIME
TOPICAL | Status: DC | PRN
  Administered 2015-07-23: 1000 mL

## 2015-07-23 MED ORDER — DEXTROSE-NACL 5-0.45 % IV SOLN: INTRAVENOUS | Status: DC

## 2015-07-23 MED ORDER — ONDANSETRON HCL 4 MG/2ML IJ SOLN
INTRAMUSCULAR | Status: DC | PRN
  Administered 2015-07-23: 4 mg via INTRAVENOUS

## 2015-07-23 MED ORDER — SODIUM CHLORIDE 0.9 % IR SOLN
Status: DC | PRN
  Administered 2015-07-23: 500 mL

## 2015-07-23 MED ORDER — HYDROMORPHONE HCL 1 MG/ML IJ SOLN
INTRAMUSCULAR | Status: AC
  Filled 2015-07-23: qty 1

## 2015-07-23 MED ORDER — SCOPOLAMINE 1 MG/3DAYS TD PT72
1.0000 | MEDICATED_PATCH | TRANSDERMAL | Status: DC
  Administered 2015-07-23: 1.5 mg via TRANSDERMAL

## 2015-07-23 SURGICAL SUPPLY — 92 items
ADH SKN CLS APL DERMABOND .7 (GAUZE/BANDAGES/DRESSINGS) ×2
APPLIER CLIP 9.375 MED OPEN (MISCELLANEOUS) ×4
APR CLP MED 9.3 20 MLT OPN (MISCELLANEOUS) ×2
ATCH SMKEVC FLXB CAUT HNDSWH (FILTER) ×2 IMPLANT
BAG DECANTER FOR FLEXI CONT (MISCELLANEOUS) ×3 IMPLANT
BINDER BREAST LRG (GAUZE/BANDAGES/DRESSINGS) IMPLANT
BINDER BREAST XLRG (GAUZE/BANDAGES/DRESSINGS) ×1 IMPLANT
BINDER BREAST XXLRG (GAUZE/BANDAGES/DRESSINGS) IMPLANT
BIOPATCH RED 1 DISK 7.0 (GAUZE/BANDAGES/DRESSINGS) ×5 IMPLANT
BLADE 10 SAFETY STRL DISP (BLADE) ×2 IMPLANT
BLADE SURG 10 STRL SS (BLADE) ×1 IMPLANT
BLADE SURG 15 STRL LF DISP TIS (BLADE) ×1 IMPLANT
BLADE SURG 15 STRL SS (BLADE) ×4
CANISTER SUCTION 2500CC (MISCELLANEOUS) ×2 IMPLANT
CHLORAPREP W/TINT 26ML (MISCELLANEOUS) ×2 IMPLANT
CLIP APPLIE 9.375 MED OPEN (MISCELLANEOUS) ×1 IMPLANT
CONT SPEC 4OZ CLIKSEAL STRL BL (MISCELLANEOUS) ×1 IMPLANT
COVER SURGICAL LIGHT HANDLE (MISCELLANEOUS) ×2 IMPLANT
DERMABOND ADVANCED (GAUZE/BANDAGES/DRESSINGS) ×2
DERMABOND ADVANCED .7 DNX12 (GAUZE/BANDAGES/DRESSINGS) ×3 IMPLANT
DRAIN CHANNEL 19F RND (DRAIN) ×5 IMPLANT
DRAPE INCISE 23X17 IOBAN STRL (DRAPES) ×1
DRAPE INCISE 23X17 STRL (DRAPES) ×1 IMPLANT
DRAPE INCISE IOBAN 23X17 STRL (DRAPES) ×1 IMPLANT
DRAPE ORTHO SPLIT 77X108 STRL (DRAPES) ×8
DRAPE PROXIMA HALF (DRAPES) ×6 IMPLANT
DRAPE SURG 17X23 STRL (DRAPES) ×8 IMPLANT
DRAPE SURG ORHT 6 SPLT 77X108 (DRAPES) ×4 IMPLANT
DRAPE WARM FLUID 44X44 (DRAPE) ×2 IMPLANT
DRSG PAD ABDOMINAL 8X10 ST (GAUZE/BANDAGES/DRESSINGS) ×4 IMPLANT
DRSG SORBAVIEW 3.5X5-5/16 MED (GAUZE/BANDAGES/DRESSINGS) ×4 IMPLANT
DRSG TEGADERM 4X4.75 (GAUZE/BANDAGES/DRESSINGS) IMPLANT
DRSG TELFA 3X8 NADH (GAUZE/BANDAGES/DRESSINGS) IMPLANT
ELECT BLADE 6.5 EXT (BLADE) ×1 IMPLANT
ELECT CAUTERY BLADE 6.4 (BLADE) ×4 IMPLANT
ELECT REM PT RETURN 9FT ADLT (ELECTROSURGICAL) ×2
ELECTRODE REM PT RTRN 9FT ADLT (ELECTROSURGICAL) ×1 IMPLANT
EVACUATOR SILICONE 100CC (DRAIN) ×5 IMPLANT
EVACUATOR SMOKE ACCUVAC VALLEY (FILTER) ×2
GAUZE SPONGE 4X4 12PLY STRL (GAUZE/BANDAGES/DRESSINGS) ×2 IMPLANT
GAUZE XEROFORM 5X9 LF (GAUZE/BANDAGES/DRESSINGS) IMPLANT
GLOVE BIO SURGEON STRL SZ7 (GLOVE) ×1 IMPLANT
GLOVE BIO SURGEON STRL SZ7.5 (GLOVE) ×6 IMPLANT
GLOVE BIOGEL PI IND STRL 6.5 (GLOVE) IMPLANT
GLOVE BIOGEL PI IND STRL 7.0 (GLOVE) IMPLANT
GLOVE BIOGEL PI IND STRL 8 (GLOVE) ×2 IMPLANT
GLOVE BIOGEL PI INDICATOR 6.5 (GLOVE) ×2
GLOVE BIOGEL PI INDICATOR 7.0 (GLOVE) ×2
GLOVE BIOGEL PI INDICATOR 8 (GLOVE) ×2
GLOVE ECLIPSE 6.5 STRL STRAW (GLOVE) ×1 IMPLANT
GLOVE ECLIPSE 7.5 STRL STRAW (GLOVE) ×1 IMPLANT
GLOVE SURG SS PI 6.5 STRL IVOR (GLOVE) ×1 IMPLANT
GLOVE SURG SS PI 7.0 STRL IVOR (GLOVE) ×2 IMPLANT
GOWN STRL REUS W/ TWL LRG LVL3 (GOWN DISPOSABLE) ×2 IMPLANT
GOWN STRL REUS W/ TWL XL LVL3 (GOWN DISPOSABLE) ×2 IMPLANT
GOWN STRL REUS W/TWL LRG LVL3 (GOWN DISPOSABLE) ×2
GOWN STRL REUS W/TWL XL LVL3 (GOWN DISPOSABLE) ×4
HANDPIECE INTERPULSE COAX TIP (DISPOSABLE) ×2
IMPLANT GEL 450CC (Breast) ×1 IMPLANT
KIT BASIN OR (CUSTOM PROCEDURE TRAY) ×2 IMPLANT
KIT ROOM TURNOVER OR (KITS) ×2 IMPLANT
MARKER SKIN DUAL TIP RULER LAB (MISCELLANEOUS) ×2 IMPLANT
NS IRRIG 1000ML POUR BTL (IV SOLUTION) ×4 IMPLANT
PACK GENERAL/GYN (CUSTOM PROCEDURE TRAY) ×2 IMPLANT
PAD ABD 8X10 STRL (GAUZE/BANDAGES/DRESSINGS) ×3 IMPLANT
PAD ARMBOARD 7.5X6 YLW CONV (MISCELLANEOUS) ×4 IMPLANT
PAD DRESSING TELFA 3X8 NADH (GAUZE/BANDAGES/DRESSINGS) IMPLANT
PENCIL BUTTON HOLSTER BLD 10FT (ELECTRODE) ×2 IMPLANT
PREFILTER EVAC NS 1 1/3-3/8IN (MISCELLANEOUS) ×2 IMPLANT
SET HNDPC FAN SPRY TIP SCT (DISPOSABLE) IMPLANT
SPONGE GAUZE 4X4 12PLY STER LF (GAUZE/BANDAGES/DRESSINGS) ×2 IMPLANT
SPONGE LAP 18X18 X RAY DECT (DISPOSABLE) ×1 IMPLANT
STAPLER VISISTAT 35W (STAPLE) ×3 IMPLANT
STRIP CLOSURE SKIN 1/2X4 (GAUZE/BANDAGES/DRESSINGS) ×2 IMPLANT
SUT MNCRL AB 3-0 PS2 18 (SUTURE) ×7 IMPLANT
SUT MON AB 2-0 CT1 36 (SUTURE) IMPLANT
SUT MON AB 5-0 PS2 18 (SUTURE) IMPLANT
SUT PDS AB 0 CT 36 (SUTURE) ×5 IMPLANT
SUT PDS AB 2-0 CT1 27 (SUTURE) ×2 IMPLANT
SUT PDS AB 3-0 SH 27 (SUTURE) IMPLANT
SUT PROLENE 3 0 PS 1 (SUTURE) ×7 IMPLANT
SUT PROLENE 3 0 PS 2 (SUTURE) ×2 IMPLANT
SUT VIC AB 3-0 FS2 27 (SUTURE) IMPLANT
SUT VIC AB 3-0 SH 18 (SUTURE) ×1 IMPLANT
SUT VIC AB 3-0 SH 8-18 (SUTURE) ×3 IMPLANT
SYR 50ML SLIP (SYRINGE) IMPLANT
SYR BULB IRRIGATION 50ML (SYRINGE) ×5 IMPLANT
TOWEL OR 17X24 6PK STRL BLUE (TOWEL DISPOSABLE) ×2 IMPLANT
TOWEL OR 17X26 10 PK STRL BLUE (TOWEL DISPOSABLE) ×2 IMPLANT
TRAY FOLEY CATH 14FRSI W/METER (CATHETERS) ×2 IMPLANT
TRAY FOLEY CATH 16FRSI W/METER (SET/KITS/TRAYS/PACK) IMPLANT
TUBE CONNECTING 12X1/4 (SUCTIONS) ×4 IMPLANT

## 2015-07-23 NOTE — H&P (Signed)
I have re-examined and re-evaluated the patient and there are no changes.  See office notes in paper chart for H&P.  Planned Procedure: Debride right chest wound, remove exposed breast implant, right latissimus flap with replacement of an implant.

## 2015-07-23 NOTE — Anesthesia Procedure Notes (Signed)
Procedure Name: Intubation Date/Time: 07/23/2015 1:43 PM Performed by: Manus Gunning, Cameka Rae J Pre-anesthesia Checklist: Patient identified, Emergency Drugs available, Suction available, Patient being monitored and Timeout performed Patient Re-evaluated:Patient Re-evaluated prior to inductionOxygen Delivery Method: Circle system utilized Preoxygenation: Pre-oxygenation with 100% oxygen Intubation Type: IV induction Ventilation: Mask ventilation without difficulty Laryngoscope Size: Mac and 3 Grade View: Grade I Tube type: Oral Tube size: 7.0 mm Number of attempts: 1 Airway Equipment and Method: Stylet Placement Confirmation: ETT inserted through vocal cords under direct vision,  positive ETCO2 and breath sounds checked- equal and bilateral Secured at: 21 cm Tube secured with: Tape Dental Injury: Teeth and Oropharynx as per pre-operative assessment

## 2015-07-23 NOTE — Brief Op Note (Signed)
07/23/2015  5:36 PM  PATIENT:  Olivia Acosta  55 y.o. female  PRE-OPERATIVE DIAGNOSIS:  BREAST CANCER  POST-OPERATIVE DIAGNOSIS:  BREAST CANCER  PROCEDURE:  Procedure(s): RIGHT CHEST WOUND DEBRIDEMENT, RIGHT BREAST RECONSTRUCTION WITH SILICONE GEL IMPLANT (Right) LATISSIMUS FLAP TO RIGHT CHEST (Right)  SURGEON:  Surgeon(s) and Role:    * Crissie Reese, MD - Primary  PHYSICIAN ASSISTANT:   ASSISTANTS: Sharon hitchcock, RNFA   ANESTHESIA:   general  EBL:  Total I/O In: 1000 [I.V.:1000] Out: 150 [Urine:150]  BLOOD ADMINISTERED:none  DRAINS: (3) [name]-Pratt drain(s) with closed bulb suction in the right anterior chest space (1) and right back donor site (2)   LOCAL MEDICATIONS USED:  NONE  SPECIMEN:  Source of Specimen:  Wound right chest  DISPOSITION OF SPECIMEN:  PATHOLOGY  COUNTS:  YES  TOURNIQUET:  * No tourniquets in log *  DICTATION: .Other Dictation: Dictation Number [id number]  PLAN OF CARE: Admit to inpatient   PATIENT DISPOSITION:  PACU - hemodynamically stable.   Delay start of Pharmacological VTE agent (>24hrs) due to surgical blood loss or risk of bleeding: no

## 2015-07-23 NOTE — Progress Notes (Signed)
Pharmacy Antibiotic Note  Olivia Acosta is a 55 y.o. female admitted on 07/23/2015 withwound dehiscence due to radiation and had an exposed breast implant.  S/p wound debridement/reconstruction today.  Pharmacy has been consulted for vancomycin dosing.  Received 1 dose of vancomycin 1g pre-op at 1300.  Est CrCl ~ 65 ml/min.  Plan: 1. Vancomycin 1g q 12 hrs. 2. F/u cultures, clinical course 3. Vancomycin trough at steady state as indicated.   Height: 5\' 7"  (170.2 cm) Weight: 158 lb (71.668 kg) IBW/kg (Calculated) : 61.6  Temp (24hrs), Avg:97.7 F (36.5 C), Min:97.4 F (36.3 C), Max:97.9 F (36.6 C)   Recent Labs Lab 07/23/15 1228  WBC 4.8  CREATININE 0.93    Estimated Creatinine Clearance: 68.8 mL/min (by C-G formula based on Cr of 0.93).    Allergies  Allergen Reactions  . Ciprofloxacin Other (See Comments)    Arm that the iv was given in begin to swell  . Penicillins Hives and Itching    Has patient had a PCN reaction causing immediate rash, facial/tongue/throat swelling, SOB or lightheadedness with hypotension: Yes Has patient had a PCN reaction causing severe rash involving mucus membranes or skin necrosis: No Has patient had a PCN reaction that required hospitalization: No Has patient had a PCN reaction occurring within the last 10 years: No If all of the above answers are "NO", then may proceed with Cephalosporin use.     Antimicrobials this admission: Vancomycin 2/16 >>    Dose adjustments this admission:   Microbiology results:  Thank you for allowing pharmacy to be a part of this patient's care.  Uvaldo Rising, BCPS  Clinical Pharmacist Pager [phone]  07/23/2015 6:58 PM

## 2015-07-23 NOTE — Op Note (Signed)
NAMELYBA, ARELLANO NO.:  [id number]  MEDICAL RECORD NO.:  [id number]  LOCATION:  [id number]                        FACILITY:  [facility]  PHYSICIAN:  Crissie Reese, M.D.     DATE OF BIRTH:  07/06/1960  DATE OF PROCEDURE:  07/23/2015 DATE OF DISCHARGE:                              OPERATIVE REPORT   PREOPERATIVE DIAGNOSES: 1. Right breast cancer. 2. Radiation injury to the right chest. 3. Wound dehiscence with exposed implant, right breast reconstruction     with a complicated open wound of the right chest.  POSTOPERATIVE DIAGNOSES: 1. Right breast cancer. 2. Radiation injury to the right chest. 3. Wound dehiscence with exposed implant, right breast reconstruction     with a complicated open wound of the right chest.  PROCEDURES PERFORMED: 1. Right latissimus myocutaneous flap. 2. Delayed breast reconstruction with silicone gel implant [id number] mL     moderate plus profile. 3. Debridement of radiation wound of the right chest, skin,     subcutaneous tissue, and muscle, and removal of exposed implant.  SURGEON:  Crissie Reese, M.D.  ASSISTANT:  Judyann Munson, RNFA.  ANESTHESIA:  General.  ESTIMATED BLOOD LOSS:  40 mL.  DRAINS:  Three 19-French drains, one in the right chest space and two in the right back donor site.  CLINICAL NOTE:  This 55 year old woman has had breast cancer and had bilateral mastectomy.  She had nipple sparing mastectomies with immediate reconstruction with tissue expanders and then subsequently had chemotherapy and radiation treatment, and then after waiting for six months post-radiation, she presented for her stage procedure to remove tissue expanders with implants.  This was done a little over six weeks ago.  She did well until two days ago when she noticed the right wound was beginning to separate.  She was seen in the office and indeed the implant was exposed.  She was scheduled for surgery.  Antibiotic ointment and a sterile  dressing were secured, and she was instructed to leave these in place until surgery.  The nature of the procedure and risks, complications were discussed with her in detail.  She understood there was a risk that she would lose this reconstruction.  Options were discussed including doing a removal of the implant and debridement of the wound and closure and then performing the reconstruction at a later time, but she did wish to try to salvage the reconstruction, and it was explained that this would be done with using a latissimus flap with implant.  Risks were discussed including but not limited to bleeding, infection, healing problems, scarring, loss of sensation, fluid accumulations, anesthesia complications, pneumothorax, DVT, PE, failure of the device, capsular contracture, displacement of the device, wrinkles and ripples, loss of tissue, loss of portions or all of the flap, loss of range of motion and/or strength in the right arm and shoulder, asymmetry, contour deformities, contour deformities in the periphery of the reconstruction, chronic pain, and overall disappointment.  She understood all this and wished to proceed.  PROCEDURE IN DETAIL:  The patient was marked in the holding area in a full standing position for the flap.  She was taken to the operating room and placed in a supine position.  After successful general anesthesia, she was placed in a left lateral decubitus position with an axillary roll in place and stabilized with a bean bag.  Once in position, the dressing was removed, and her right anterior chest was prepped with Betadine and the back donor site was prepped with ChloraPrep.  After waiting full three minutes for drying, she was draped with sterile drapes.  The wound debridement was performed first excising the radiated tissue in the inferior pole of the breast.  This was a fairly generous area that was involved with radiation injury.  This tissue was removed as a  specimen.  The implant was removed.  The space was inspected, noted to be in good condition.  No evidence of any purulence or exudate.  It was irrigated thoroughly with the pulse lavage system.  Because this has been an open wound for two days, antibiotic solution on laps was then used in the wound, and attention was directed to the back.  Gloves were changed, and the instruments that had been used for the debridement were removed from the field.  The skin paddle was then incised, and dissection was carried down through the subcutaneous tissue beveling outward both medial and lateral in order to ensure broader attachment at the level of the muscle in order to preserve blood supply to the skin paddle.  The dissection of the muscle and then the muscle borders were then exposed, medial, lateral, superior, and inferior.  Subcutaneous tunnel was made to the right chest space.  The muscle was then divided distal and then reflected in a cephalad direction gently.  The larger perforating vessels were triple ligated with the Ligaclips and divided.  An occasional suture tie was needed.  Otherwise, hemostasis was maintained using electrocautery.  The flap was then rotated into position and passed through the subcutaneous tunnel to the right chest where it was temporarily secured with skin staples.  The flap had excellent color and bright red bleeding at its periphery consistent with viability.  The back donor site was then irrigated thoroughly with saline. Meticulous hemostasis with electrocautery.  Two 19-French drains positioned, brought out through separate stab wounds anteroinferiorly and secured with 3-0 Prolene sutures.  The back donor site was then closed with 2-0 and 0 PDS interrupted inverted deep sutures followed by 2-0 Monocryl interrupted inverted deep dermal sutures, 3-0 Monocryl running subcuticular suture, and a few 3-0 Prolene simple interrupted sutures for reinforcement.   Dermabond was used for the incision, and a dry sterile dressing, Biopatch and SorbaView for the drains.  The dressing having been taped with Hypafix, a large Tegaderm was then placed over the flap and the right chest to preserve sterilely, and she was then turned to a supine position.  Once in position, the Tegaderm was removed, and she was prepped with Betadine and draped with sterile drapes.  The skin staples that were temporally secured to the flap were then removed, and the space inspected, noted to be in good condition.  Irrigated with saline as well as antibiotic solution.  A 19-French drain was positioned, brought out through separate stab wound inferomedially and secured with a 3-0 Prolene suture.  The flap insetting with some 3-0 Vicryl simple interrupted sutures deep to the muscle followed by a 3-0 Monocryl interrupted inverted deep dermal sutures and a few 3-0 Prolene simple sutures at either corner for security.  The flap had excellent color, bright red bleeding throughout the periphery with consistent viability. Dermabond, dry sterile dressing, and the breast  vest were positioned and a Biopatch and SorbaView again for the drain.  She was transferred to the recovery room stable having tolerated the procedure well.  DISPOSITION:  She will be admitted to the hospital.     Crissie Reese, M.D.     DB/MEDQ  D:  07/23/2015  T:  07/23/2015  Job:  [id number]

## 2015-07-23 NOTE — Transfer of Care (Signed)
Immediate Anesthesia Transfer of Care Note  Patient: Olivia Acosta  Procedure(s) Performed: Procedure(s): RIGHT CHEST WOUND DEBRIDEMENT, RIGHT BREAST RECONSTRUCTION WITH SILICONE GEL IMPLANT (Right) LATISSIMUS FLAP TO RIGHT CHEST (Right)  Patient Location: PACU  Anesthesia Type:General  Level of Consciousness: awake, alert , oriented and patient cooperative  Airway & Oxygen Therapy: Patient Spontanous Breathing and Patient connected to nasal cannula oxygen  Post-op Assessment: Report given to RN and Post -op Vital signs reviewed and stable  Post vital signs: Reviewed and stable  Last Vitals:  Filed Vitals:   07/23/15 1126  BP: 111/59  Pulse: 70  Temp: 36.3 C  Resp: 18    Complications: No apparent anesthesia complications

## 2015-07-23 NOTE — Anesthesia Preprocedure Evaluation (Signed)
Anesthesia Evaluation  Patient identified by MRN, date of birth, ID band Patient awake    Reviewed: Allergy & Precautions, NPO status , Patient's Chart, lab work & pertinent test results  History of Anesthesia Complications Negative for: history of anesthetic complications  Airway Mallampati: II  TM Distance: >3 FB Neck ROM: Full    Dental  (+) Teeth Intact, Dental Advisory Given   Pulmonary    breath sounds clear to auscultation       Cardiovascular negative cardio ROS   Rhythm:Regular     Neuro/Psych PSYCHIATRIC DISORDERS Depression negative neurological ROS     GI/Hepatic Neg liver ROS, GERD  Medicated and Controlled,  Endo/Other  negative endocrine ROS  Renal/GU negative Renal ROS     Musculoskeletal  (+) Arthritis ,   Abdominal   Peds  Hematology negative hematology ROS (+)   Anesthesia Other Findings   Reproductive/Obstetrics                             Anesthesia Physical  Anesthesia Plan  ASA: II  Anesthesia Plan: General   Post-op Pain Management:    Induction: Intravenous  Airway Management Planned: Oral ETT  Additional Equipment: None  Intra-op Plan:   Post-operative Plan: Extubation in OR  Informed Consent: I have reviewed the patients History and Physical, chart, labs and discussed the procedure including the risks, benefits and alternatives for the proposed anesthesia with the patient or authorized representative who has indicated his/her understanding and acceptance.   Dental advisory given  Plan Discussed with: CRNA and Surgeon  Anesthesia Plan Comments:         Anesthesia Quick Evaluation

## 2015-07-23 NOTE — Progress Notes (Signed)
Pt admitted to unit at 1830hrs from pacu s/p mastectomy with flap and implant. Alert and oriented with no concerns voiced

## 2015-07-24 NOTE — Progress Notes (Signed)
Subjective: Sore but good pain control. No nausea. Tolerating fluids well.  Objective: Vital signs in last 24 hours: Temp:  [97.4 F (36.3 C)-98.6 F (37 C)] 98.4 F (36.9 C) (02/17 0550) Pulse Rate:  [69-92] 69 (02/17 0550) Resp:  [14-20] 18 (02/17 0550) BP: (100-122)/(49-77) 100/55 mmHg (02/17 0550) SpO2:  [99 %-100 %] 99 % (02/17 0550) Weight:  [158 lb (71.668 kg)-170 lb 3.2 oz (77.202 kg)] 170 lb 3.2 oz (77.202 kg) (02/16 1830)  Intake/Output from previous day: 02/16 0701 - 02/17 0700 In: 3160 [P.O.:240; I.V.:2920] Out: 1550 [Urine:1400; Drains:150] Intake/Output this shift:    Operative sites: Mastectomy flaps viable. Flap viable. Good color throughout skin paddle. Implant in good position. Drains functioning. Drainage thin. There is no evidence of bleeding or infection right chest or back donor site..   Recent Labs  07/23/15 1228  WBC 4.8  HGB 11.9*  HCT 36.6  NA 139  K 4.3  CL 102  CO2 24  BUN 13  CREATININE 0.93    Studies/Results: No results found.  Assessment/Plan: Good progress. D/C foley and IV fluids. Ambulate. Continue DVT prophylaxis.   LOS: 1 day    Harlow Mares, [place] Hanlon M 07/24/2015 8:14 AM

## 2015-07-24 NOTE — Care Management Note (Signed)
Case Management Note  Patient Details  Name: Olivia Acosta MRN: [id number] Date of Birth: July 13, 1960  Subjective/Objective:                    Action/Plan:  Initial UR completed  Expected Discharge Date:                  Expected Discharge Plan:  Home/Self Care  In-House Referral:     Discharge planning Services     Post Acute Care Choice:    Choice offered to:     DME Arranged:    DME Agency:     HH Arranged:    [place] Agency:     Status of Service:  In process, will continue to follow  Medicare Important Message Given:    Date Medicare IM Given:    Medicare IM give by:    Date Additional Medicare IM Given:    Additional Medicare Important Message give by:     If discussed at [place] of Stay Meetings, dates discussed:    Additional Comments:  Marilu Favre, RN 07/24/2015, 7:50 AM

## 2015-07-25 MED ORDER — SULFAMETHOXAZOLE-TRIMETHOPRIM 800-160 MG PO TABS
1.0000 | ORAL_TABLET | Freq: Two times a day (BID) | ORAL | Status: DC
  Administered 2015-07-25: 1 via ORAL
  Filled 2015-07-25 (×2): qty 1

## 2015-07-25 MED ORDER — SULFAMETHOXAZOLE-TRIMETHOPRIM 800-160 MG PO TABS: 1.0000 | ORAL_TABLET | Freq: Two times a day (BID) | ORAL | Status: DC

## 2015-07-25 MED ORDER — HYDROMORPHONE HCL 2 MG PO TABS: 2.0000 mg | ORAL_TABLET | ORAL | Status: DC | PRN

## 2015-07-25 NOTE — Discharge Instructions (Addendum)
No lifting for 6 weeks No vigorous activity for 6 weeks (including outdoor walks) No driving for 4 weeks OK to walk up stairs slowly No lifting, exercising, walking outdoors, or raising arms overhead Stay propped up Use incentive spirometer at home every hour while awake No shower while drains are in place Empty drains at least three times a day and record the amounts separately Change drain dressings every third day if instructed to do so by Dr. Harlow Mares (wait until after return to office)  Apply Bacitracin antibiotic ointment to the drain sites  Place gauze dressing over drains  Secure the gauze with tape Take an over-the-counter Probiotic while on antibiotics Take an over-the-counter stool softener (such as Colace) while on pain medication Resume lovenox shots once a day this afternoon See Dr. Harlow Mares in office this week For questions call [phone] or [phone]

## 2015-07-25 NOTE — Discharge Summary (Signed)
Physician Discharge Summary  Patient ID: Olivia Acosta MRN: [id number] DOB/AGE: [birth year] 55 y.o.  Admit date: 07/23/2015 Discharge date: 07/25/2015  Admission Diagnoses: Right breast cancer, right chest radiation injury, right chest wound dehiscence with exposed breast implant  Discharge Diagnoses: Same Active Problems:   Breast cancer in female [facility])   Discharged Condition: good  Hospital Course: On the day of admission the patient was taken to surgery and had right chest wound debridement, latissimus flap, and replacement of implant . The patient tolerated the procedures well. Postoperatively, the flap maintained excellent color and capillary refill. The patient was ambulatory and tolerating diet on the first postoperative day. She is ready for discharge.  Treatments: antibiotics: vancomycin, anticoagulation: heparin and surgery: right chest wound debridement, latissimus flap, delayed breast reconstruction with silicone gel implant [id number] cc.  Discharge Exam: Blood pressure 102/57, pulse 74, temperature 98.7 F (37.1 C), temperature source Oral, resp. rate 18, height 5\' 7"  (1.702 m), weight 170 lb 3.2 oz (77.202 kg), last menstrual period 05/28/2011, SpO2 99 %.  Operative sites: Mastectomy flaps viable. Flap viable. Implant in good position. Drains functioning. Drainage thin. There is no evidence of bleeding or infection chest or back.  Disposition: 01-Home or Self Care     Medication List    TAKE these medications        acetaminophen 500 MG tablet  Commonly known as:  TYLENOL  Take 500 mg by mouth every 6 (six) hours as needed for mild pain.     anastrozole 1 MG tablet  Commonly known as:  ARIMIDEX  Take 1 tablet (1 mg total) by mouth daily.     docusate sodium 100 MG capsule  Commonly known as:  COLACE  Take 1 capsule (100 mg total) by mouth daily.     enoxaparin 40 MG/0.4ML injection  Commonly known as:  LOVENOX  Inject 0.4 mLs (40 mg total) into the skin daily.      esomeprazole 20 MG capsule  Commonly known as:  NEXIUM  Take 1 capsule (20 mg total) by mouth daily at 12 noon.     HYDROmorphone 2 MG tablet  Commonly known as:  DILAUDID  Take 1-2 tablets (2-4 mg total) by mouth every 4 (four) hours as needed for moderate pain or severe pain.     methocarbamol 500 MG tablet  Commonly known as:  ROBAXIN  Take 1 tablet (500 mg total) by mouth every 6 (six) hours as needed for muscle spasms.     sulfamethoxazole-trimethoprim 800-160 MG tablet  Commonly known as:  BACTRIM DS,SEPTRA DS  Take 1 tablet by mouth every 12 (twelve) hours.     sulfamethoxazole-trimethoprim 800-160 MG tablet  Commonly known as:  BACTRIM DS,SEPTRA DS  Take 1 tablet by mouth every 12 (twelve) hours.     valACYclovir 500 MG tablet  Commonly known as:  VALTREX  Take 500 mg by mouth daily before breakfast.     zolpidem 10 MG tablet  Commonly known as:  AMBIEN  Take 10 mg by mouth at bedtime as needed for sleep.         SignedHarlow Mares, Tenaya Hilyer M 07/25/2015, 11:20 AM

## 2015-07-25 NOTE — Progress Notes (Signed)
Breast cancer bag given and explained.  Pt has had JP drains multiple times and understands how to empty them and record.  Charts given for pt to record JP output on  And take to follow up appointments with Dr. Harlow Mares.  Pink breast binder on.

## 2015-07-27 ENCOUNTER — Encounter (HOSPITAL_COMMUNITY): Payer: Self-pay | Admitting: Plastic Surgery

## 2015-07-30 NOTE — Anesthesia Postprocedure Evaluation (Signed)
Anesthesia Post Note  Patient: Olivia Acosta  Procedure(s) Performed: Procedure(s) (LRB): RIGHT CHEST WOUND DEBRIDEMENT, RIGHT BREAST RECONSTRUCTION WITH SILICONE GEL IMPLANT (Right) LATISSIMUS FLAP TO RIGHT CHEST (Right)  Patient location during evaluation: PACU Anesthesia Type: General Level of consciousness: awake and alert Pain management: pain level controlled Vital Signs Assessment: post-procedure vital signs reviewed and stable Respiratory status: spontaneous breathing, nonlabored ventilation, respiratory function stable and patient connected to nasal cannula oxygen Cardiovascular status: blood pressure returned to baseline and stable Postop Assessment: no signs of nausea or vomiting Anesthetic complications: no                  Zenaida Deed

## 2015-08-28 ENCOUNTER — Ambulatory Visit: Payer: BLUE CROSS/BLUE SHIELD | Attending: Plastic Surgery | Admitting: Physical Therapy

## 2015-08-28 DIAGNOSIS — M25611 Stiffness of right shoulder, not elsewhere classified: Secondary | ICD-10-CM | POA: Diagnosis present

## 2015-08-28 DIAGNOSIS — R6889 Other general symptoms and signs: Secondary | ICD-10-CM

## 2015-08-28 DIAGNOSIS — M25612 Stiffness of left shoulder, not elsewhere classified: Secondary | ICD-10-CM | POA: Insufficient documentation

## 2015-08-28 DIAGNOSIS — R29898 Other symptoms and signs involving the musculoskeletal system: Secondary | ICD-10-CM | POA: Diagnosis present

## 2015-08-28 NOTE — Therapy (Signed)
[place], [place], [address] Phone: [phone]   Fax:  [phone]  Physical Therapy Evaluation  Patient Details  Name: Olivia Acosta MRN: [id number] Date of Birth: 03-13-1961 Referring Provider: Dr. Crissie Reese  Encounter Date: 08/28/2015      PT End of Session - 08/28/15 1237    Visit Number 1   Number of Visits 9   Date for PT Re-Evaluation 10/02/15   PT Start Time 0800   PT Stop Time 0850   PT Time Calculation (min) 50 min   Activity Tolerance Patient tolerated treatment well   Behavior During Therapy [facility] for tasks assessed/performed      Past Medical History  Diagnosis Date  . Bronchitis 2016    treated /w IV antibiotics, admitted for 2 nights [facility]  . GERD (gastroesophageal reflux disease)   . Neuropathy ([place])     both feet since chemotherapy  . Breast cancer ([place])     R breast- 2015  . Arthritis     fingers stiff    Past Surgical History  Procedure Laterality Date  . Tonsillectomy and adenoidectomy  1974  . Stricture of the uretha  1975  . Tonsillectomy    . Breast reconstruction Bilateral 04/17/2014  . Complete mastectomy w/ sentinel node biopsy Bilateral 04/17/2014    NIPPLE SPARING RECONSTRUCTION  . Breast reconstruction with placement of tissue expander and flex hd (acellular hydrated dermis) Bilateral 04/17/2014    Procedure: BILATERAL BREAST RECONSTRUCTION WITH PLACEMENT OF TISSUE EXPANDER AND FLEX HD (ACELLULAR HYDRATED DERMIS);  Surgeon: Crissie Reese, MD;  Location: [place];  Service: Plastics;  Laterality: Bilateral;  . Mastectomy w/ sentinel node biopsy Bilateral 04/17/2014    Procedure: BILATERAL NIPPLE SPARING MASTECTOMY WITH SENTINEL LYMPH NODE BIOPSY;  Surgeon: Excell Seltzer, MD;  Location: [place];  Service: General;  Laterality: Bilateral;  . Portacath placement Left 05/27/2014    Procedure: INSERTION PORT-A-CATH;  Surgeon: Excell Seltzer, MD;  Location:  [place];  Service: General;  Laterality: Left;  . Port a cath revision Right 05/27/2014    Procedure: PORT A CATH REVISION;  Surgeon: Excell Seltzer, MD;  Location: [place];  Service: General;  Laterality: Right;  . Salivary stone removal  2009  . Removal of tissue expander and placement of implant Bilateral 06/04/2015    Procedure: REMOVAL OF BILATERAL TISSUE EXPANDER AND DELAYED BREAST RECONSTRUCTION PLACEMENT OF IMPLANTS;  Surgeon: Crissie Reese, MD;  Location: [place];  Service: Plastics;  Laterality: Bilateral;  . Port-a-cath removal Right 06/04/2015    Procedure: REMOVAL PORT-A-CATH;  Surgeon: Crissie Reese, MD;  Location: [place];  Service: Plastics;  Laterality: Right;  . Urethral stricture dilatation    . Breast reconstruction Right 07/23/2015    Procedure: RIGHT CHEST WOUND DEBRIDEMENT, RIGHT BREAST RECONSTRUCTION WITH SILICONE GEL IMPLANT;  Surgeon: Crissie Reese, MD;  Location: [place];  Service: Plastics;  Laterality: Right;  . Latissimus flap to breast Right 07/23/2015    Procedure: LATISSIMUS FLAP TO RIGHT CHEST;  Surgeon: Crissie Reese, MD;  Location: [place];  Service: Plastics;  Laterality: Right;    There were no vitals filed for this visit.  Visit Diagnosis:  Shoulder stiffness, right - Plan: PT plan of care cert/re-cert  Shoulder stiffness, left - Plan: PT plan of care cert/re-cert  Impaired function of upper extremity - Plan: PT plan of care cert/re-cert  Weakness of shoulder - Plan: PT plan of care cert/re-cert  Subjective Assessment - 08/28/15 0811    Subjective Here about ROM after reconstruction in December and revision in February.   Pertinent History Diagnosed August 2015 with right breast cancer, stage IIIA with 10 lymph nodes affected, 2 removed.  Bilateral mastectomy 04/17/14 with expanders placed immediately.  Had chemo January-Oct 23, 2014.  Completed radiation January 02, 2015.  Had reconstruction in 06/04/15, then had a second  procedure in February because stitches didn't hold due to radiation.  Had lat flap procedure at that time, 07/23/15.  Has just started driving again, and is unloading the dishwasher, etc.  Otherwise healthy.  Patient was seen in this clinic in December 2015.  Had been lifting upper body weights before reconstruction.   Patient Stated Goals Get right arm extension ROM improved.   Currently in Pain? No/denies  still tender at the incision at breast; back feels like a sore muscle            [facility] PT Assessment - 08/28/15 0001    Assessment   Medical Diagnosis right breast cancer   Referring Provider Dr. Crissie Reese   Onset Date/Surgical Date 07/23/15   Precautions   Precautions Other (comment)   Precaution Comments no massage; cancer precautions   Restrictions   Weight Bearing Restrictions No   Balance Screen   Has the patient fallen in the past 6 months No   Has the patient had a decrease in activity level because of a fear of falling?  No   Is the patient reluctant to leave their home because of a fear of falling?  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 Judieth Keens her to walk starting next week; patient is a runner and wants to get back to that; had been doing gym workouts including upper extremity weights   Cognition   Overall Cognitive Status Within Functional Limits for tasks assessed   Observation/Other Assessments   Skin Integrity back incision is healed well; breast inferior incision is healing and still has stitches in lateral aspect.  Port has been removed from right chest and that site is still healing.   Quick  DASH  20,45   Observation/Other Assessments-Edema    Edema --  pt. reports swelling at right flank   Posture/Postural Control   Posture/Postural Control Postural limitations   Postural Limitations Rounded Shoulders;Forward head  slight   ROM / Strength   AROM / PROM / Strength AROM;PROM;Strength   AROM   AROM Assessment Site Shoulder   Right/Left Shoulder Right;Left   Right Shoulder Extension 49 Degrees   Right Shoulder Flexion 114 Degrees  in sitting   Right Shoulder ABduction 87 Degrees   Right Shoulder Internal Rotation --  WFL, in supine   Right Shoulder External Rotation 53 Degrees   Left Shoulder Extension 52 Degrees   Left Shoulder Flexion 147 Degrees   Left Shoulder ABduction 161 Degrees   Left Shoulder Internal Rotation --  WFL, in supine   Left Shoulder External Rotation 75 Degrees   PROM   Overall PROM Comments right shoulder passive flexion 140, abduction  85   Strength   Overall Strength Comments right shoulder grossly 3-/5           LYMPHEDEMA/ONCOLOGY QUESTIONNAIRE - 08/28/15 0828    Type   Cancer Type right breast cancer with bilateral mastectomies   Surgeries   Mastectomy Date 04/17/14   Other Surgery Date 07/23/15  revision in February;1st reconstruction 06/04/15   Number Lymph Nodes Removed 2  but 10 negative   Treatment   Past Chemotherapy Treatment Yes   Date 10/23/14   Past Radiation Treatment Yes   Date 01/02/15   Lymphedema Assessments   Lymphedema Assessments Upper extremities   Right Upper Extremity Lymphedema   10 cm Proximal to Olecranon Process 28.3 cm   Olecranon Process 24.6 cm   10 cm Proximal to Ulnar Styloid Process 20.8 cm   Just Proximal to Ulnar Styloid Process 15 cm   Across Hand at [employer] 18.6 cm   At [place] of 2nd Digit 5.9 cm   Left Upper Extremity Lymphedema   10 cm Proximal to Olecranon Process 27 cm   Olecranon Process 24.6 cm   10 cm Proximal to Ulnar Styloid Process 19.9 cm   Just Proximal to Ulnar  Styloid Process 14.6 cm   Across Hand at [employer] 17.9 cm   At [place] of 2nd Digit 5.9 cm           Quick Dash - 08/28/15 0001    Open a tight or new jar Moderate difficulty   Do heavy household chores (wash walls, wash floors) No difficulty   Carry a shopping bag or briefcase Mild difficulty   Wash your back No difficulty   Use a knife to cut food No difficulty   Recreational activities in which you take some force or impact through your arm, shoulder, or hand (golf, hammering, tennis) Moderate difficulty   During the past week, to what extent has your arm, shoulder or hand problem interfered with your normal social activities with family, friends, neighbors, or groups? Slightly   During the past week, to what extent has your arm, shoulder or hand problem limited your work or other regular daily activities Slightly   Arm, shoulder, or hand pain. Mild   Tingling (pins and needles) in your arm, shoulder, or hand Mild   Difficulty Sleeping No difficulty   DASH Score 20.45 %                             Long Term Clinic Goals - 08/28/15 1244    CC Long Term Goal  #1   Title pt will have 140 degrees of active shoulder flexion on right for improved overhead reach   Baseline 114 at time of eval on 08/28/15   Time 4   Period Weeks   Status New   CC Long Term Goal  #2   Title pt wil have 140 degrees of active shoulder flexion on the left for improved ADLs   Baseline 87 at time of eval on 08/28/15   Time 4   Period Weeks   Status New   CC Long Term Goal  #3   Title pt will be independent in a home program for shoulder range of motion   Time 4   Period Weeks   Status New   CC Long Term Goal  #4   Title right shoulder external rotation to at least 75 degrees   Baseline 53 at evaluation  Time 4   Period Weeks   Status New   CC Long Term Goal  #5   Title decrease quick DASH score to 10 or less   Baseline 20.45   Time 4   Period Weeks   Status New             Plan - 08/28/15 1238    Clinical Impression Statement Patient who is s/p bilateral mastectomies, chemo, radiation, and reconstruction for right breast cancer, including revision needed of right reconstruction with lat flap performed on 07/23/15, returns to [place] with limited right shoulder ROM and strength.  She also has mild posture impairments. Her incisions are healing well, but still has stitches at right inferior lateral breast and Port site still is scabbed.  Her right UE circumference measurements are slightly bigger than left.   Pt will benefit from skilled therapeutic intervention in order to improve on the following deficits Decreased range of motion;Decreased strength;Impaired UE functional use   Rehab Potential Excellent   Clinical Impairments Affecting Rehab Potential Still just 5 weeks out from lat flap reconstruction; previous radiation at right chest   PT Frequency 2x / week  she may only be able to come 1x/week some weeks   PT Duration 4 weeks   PT Treatment/Interventions ADLs/Self Care Home Management;Electrical Stimulation;DME Instruction;Therapeutic exercise;Patient/family education;Manual techniques;Manual lymph drainage;Passive range of motion   PT Next Visit Plan Progress her HEP for right shoulder ROM.  Begin P/AA/AROM activities for right shoulder.  Include left shoulder, but focus on right. Go over lymphedema risk reduction and assist with getting a sleeve if desired.   PT Home Exercise Plan Dowel exercises.   Consulted and Agree with Plan of Care Patient         Problem List Patient Active Problem List   Diagnosis Date Noted  . Breast cancer in female [facility]) 07/23/2015  . Breast cancer, right breast ([place]) 06/04/2015  . Fever 09/14/2014  . Leukopenia 09/14/2014  . Flu-like symptoms 09/14/2014  . Acute bronchitis 09/14/2014  . Breast cancer of lower-outer quadrant of right female breast ([place]) 04/17/2014  . DEPRESSION 08/03/2007  . SWELLING MASS OR LUMP  IN HEAD AND NECK 08/03/2007    SALISBURY,DONNA 08/28/2015, 12:48 PM  [place], [place], [address] Phone: [phone]   Fax:  [phone]  Name: Olivia Acosta MRN: [id number] Date of Birth: Aug 15, 1960   Serafina Royals, PT 08/28/2015 12:48 PM

## 2015-09-02 ENCOUNTER — Ambulatory Visit: Payer: BLUE CROSS/BLUE SHIELD | Admitting: Physical Therapy

## 2015-09-02 DIAGNOSIS — R6889 Other general symptoms and signs: Secondary | ICD-10-CM

## 2015-09-02 DIAGNOSIS — M25611 Stiffness of right shoulder, not elsewhere classified: Secondary | ICD-10-CM | POA: Diagnosis not present

## 2015-09-02 DIAGNOSIS — M25612 Stiffness of left shoulder, not elsewhere classified: Secondary | ICD-10-CM

## 2015-09-02 NOTE — Therapy (Signed)
[place], [place], [address] Phone: [phone]   Fax:  [phone]  Physical Therapy Treatment  Patient Details  Name: Olivia Acosta MRN: [id number] Date of Birth: [birth year] Referring Provider: Dr. Crissie Reese  Encounter Date: 09/02/2015      PT End of Session - 09/02/15 1240    Visit Number 2   Number of Visits 9   Date for PT Re-Evaluation 10/02/15   PT Start Time 0935   PT Stop Time 1018   PT Time Calculation (min) 43 min   Activity Tolerance Patient tolerated treatment well   Behavior During Therapy [facility] for tasks assessed/performed      Past Medical History  Diagnosis Date  . Bronchitis 2016    treated /w IV antibiotics, admitted for 2 nights [facility]  . GERD (gastroesophageal reflux disease)   . Neuropathy ([place])     both feet since chemotherapy  . Breast cancer ([place])     R breast- 2015  . Arthritis     fingers stiff    Past Surgical History  Procedure Laterality Date  . Tonsillectomy and adenoidectomy  1974  . Stricture of the uretha  1975  . Tonsillectomy    . Breast reconstruction Bilateral 04/17/2014  . Complete mastectomy w/ sentinel node biopsy Bilateral 04/17/2014    NIPPLE SPARING RECONSTRUCTION  . Breast reconstruction with placement of tissue expander and flex hd (acellular hydrated dermis) Bilateral 04/17/2014    Procedure: BILATERAL BREAST RECONSTRUCTION WITH PLACEMENT OF TISSUE EXPANDER AND FLEX HD (ACELLULAR HYDRATED DERMIS);  Surgeon: Crissie Reese, MD;  Location: [place];  Service: Plastics;  Laterality: Bilateral;  . Mastectomy w/ sentinel node biopsy Bilateral 04/17/2014    Procedure: BILATERAL NIPPLE SPARING MASTECTOMY WITH SENTINEL LYMPH NODE BIOPSY;  Surgeon: Excell Seltzer, MD;  Location: [place];  Service: General;  Laterality: Bilateral;  . Portacath placement Left 05/27/2014    Procedure: INSERTION PORT-A-CATH;  Surgeon: Excell Seltzer, MD;  Location:  [place];  Service: General;  Laterality: Left;  . Port a cath revision Right 05/27/2014    Procedure: PORT A CATH REVISION;  Surgeon: Excell Seltzer, MD;  Location: [place];  Service: General;  Laterality: Right;  . Salivary stone removal  2009  . Removal of tissue expander and placement of implant Bilateral 06/04/2015    Procedure: REMOVAL OF BILATERAL TISSUE EXPANDER AND DELAYED BREAST RECONSTRUCTION PLACEMENT OF IMPLANTS;  Surgeon: Crissie Reese, MD;  Location: [place];  Service: Plastics;  Laterality: Bilateral;  . Port-a-cath removal Right 06/04/2015    Procedure: REMOVAL PORT-A-CATH;  Surgeon: Crissie Reese, MD;  Location: [place];  Service: Plastics;  Laterality: Right;  . Urethral stricture dilatation    . Breast reconstruction Right 07/23/2015    Procedure: RIGHT CHEST WOUND DEBRIDEMENT, RIGHT BREAST RECONSTRUCTION WITH SILICONE GEL IMPLANT;  Surgeon: Crissie Reese, MD;  Location: [place];  Service: Plastics;  Laterality: Right;  . Latissimus flap to breast Right 07/23/2015    Procedure: LATISSIMUS FLAP TO RIGHT CHEST;  Surgeon: Crissie Reese, MD;  Location: [place];  Service: Plastics;  Laterality: Right;    There were no vitals filed for this visit.  Visit Diagnosis:  Shoulder stiffness, right  Shoulder stiffness, left  Impaired function of upper extremity      Subjective Assessment - 09/02/15 0937    Subjective "Doing well.  I did some extra stretching on Sunday."  Feels she has enough to work on at home.  Does wall  walking in the shower and uses the dowel rod; also reaches into the cabinet.   Currently in Pain? No/denies                         [facility] Adult PT Treatment/Exercise - 09/02/15 0001    Self-Care   Self-Care Other Self-Care Comments   Other Self-Care Comments  Talked about her HEP.  She is doing wall walking in shower and dowel exercises.  She is okayed to start walking tomorrow.   Manual Therapy   Manual Therapy  Myofascial release;Passive ROM;Scapular mobilization   Myofascial Release right UE myofascial pulling in supine with movement into abduction   Scapular Mobilization to right in left sidelying with rhythmic movements into protraction in varying degrees of right shoulder flexion   Passive ROM In supine to right shoulder and left shoulder for ER, abduction, and flexion.                        [place] [facility] Goals - 08/28/15 1244    CC Long Term Goal  #1   Title pt will have 140 degrees of active shoulder flexion on right for improved overhead reach   Baseline 114 at time of eval on 08/28/15   Time 4   Period Weeks   Status New   CC Long Term Goal  #2   Title pt wil have 140 degrees of active shoulder flexion on the left for improved ADLs   Baseline 87 at time of eval on 08/28/15   Time 4   Period Weeks   Status New   CC Long Term Goal  #3   Title pt will be independent in a home program for shoulder range of motion   Time 4   Period Weeks   Status New   CC Long Term Goal  #4   Title right shoulder external rotation to at least 75 degrees   Baseline 53 at evaluation   Time 4   Period Weeks   Status New   CC Long Term Goal  #5   Title decrease quick DASH score to 10 or less   Baseline 20.45   Time 4   Period Weeks   Status New            Plan - 09/02/15 1240    Clinical Impression Statement Patient did well today and tolerated stretching without complaint.  She reported feeling better at end of session.   Pt will benefit from skilled therapeutic intervention in order to improve on the following deficits Decreased range of motion;Decreased strength;Impaired UE functional use   Rehab Potential Excellent   Clinical Impairments Affecting Rehab Potential Still just 5 weeks out from lat flap reconstruction; previous radiation at right chest   PT Frequency 2x / week   PT Duration 4 weeks   PT Treatment/Interventions Passive range of motion;Manual techniques    PT Next Visit Plan Continue P/AA/AROM activities for right shoulder.  Include left shoulder, but focus on right. Go over lymphedema risk reduction and assist with getting a sleeve if desired.   PT Home Exercise Plan Dowel exercises, wall walking in shoulder.   Consulted and Agree with Plan of Care Patient        Problem List Patient Active Problem List   Diagnosis Date Noted  . Breast cancer in female [facility]) 07/23/2015  . Breast cancer, right breast ([place]) 06/04/2015  . Fever 09/14/2014  . Leukopenia  09/14/2014  . Flu-like symptoms 09/14/2014  . Acute bronchitis 09/14/2014  . Breast cancer of lower-outer quadrant of right female breast ([place]) 04/17/2014  . DEPRESSION 08/03/2007  . SWELLING MASS OR LUMP IN HEAD AND NECK 08/03/2007    SALISBURY,DONNA 09/02/2015, 12:44 PM  [place], [place], [address] Phone: [phone]   Fax:  [phone]  Name: Olivia Acosta MRN: [id number] Date of Birth: [birth year]    Serafina Royals, PT 09/02/2015 12:44 PM

## 2015-09-08 ENCOUNTER — Ambulatory Visit: Payer: BLUE CROSS/BLUE SHIELD | Attending: Plastic Surgery | Admitting: Physical Therapy

## 2015-09-08 DIAGNOSIS — M25611 Stiffness of right shoulder, not elsewhere classified: Secondary | ICD-10-CM | POA: Diagnosis present

## 2015-09-08 DIAGNOSIS — M25612 Stiffness of left shoulder, not elsewhere classified: Secondary | ICD-10-CM | POA: Insufficient documentation

## 2015-09-08 NOTE — Therapy (Signed)
[place], [place], [address] Phone: [phone]   Fax:  [phone]  Physical Therapy Treatment  Patient Details  Name: Olivia Acosta MRN: [id number] Date of Birth: 1960-12-06 Referring Provider: Dr. Crissie Reese  Encounter Date: 09/08/2015      PT End of Session - 09/08/15 1719    Visit Number 3   Number of Visits 9   Date for PT Re-Evaluation 10/02/15   PT Start Time 1608   PT Stop Time 1653   PT Time Calculation (min) 45 min   Activity Tolerance Patient tolerated treatment well   Behavior During Therapy [facility] for tasks assessed/performed      Past Medical History  Diagnosis Date  . Bronchitis 2016    treated /w IV antibiotics, admitted for 2 nights [facility]  . GERD (gastroesophageal reflux disease)   . Neuropathy ([place])     both feet since chemotherapy  . Breast cancer ([place])     R breast- 2015  . Arthritis     fingers stiff    Past Surgical History  Procedure Laterality Date  . Tonsillectomy and adenoidectomy  1974  . Stricture of the uretha  1975  . Tonsillectomy    . Breast reconstruction Bilateral 04/17/2014  . Complete mastectomy w/ sentinel node biopsy Bilateral 04/17/2014    NIPPLE SPARING RECONSTRUCTION  . Breast reconstruction with placement of tissue expander and flex hd (acellular hydrated dermis) Bilateral 04/17/2014    Procedure: BILATERAL BREAST RECONSTRUCTION WITH PLACEMENT OF TISSUE EXPANDER AND FLEX HD (ACELLULAR HYDRATED DERMIS);  Surgeon: Crissie Reese, MD;  Location: [place];  Service: Plastics;  Laterality: Bilateral;  . Mastectomy w/ sentinel node biopsy Bilateral 04/17/2014    Procedure: BILATERAL NIPPLE SPARING MASTECTOMY WITH SENTINEL LYMPH NODE BIOPSY;  Surgeon: Excell Seltzer, MD;  Location: [place];  Service: General;  Laterality: Bilateral;  . Portacath placement Left 05/27/2014    Procedure: INSERTION PORT-A-CATH;  Surgeon: Excell Seltzer, MD;  Location: [place];  Service: General;  Laterality: Left;  . Port a cath revision Right 05/27/2014    Procedure: PORT A CATH REVISION;  Surgeon: Excell Seltzer, MD;  Location: [place];  Service: General;  Laterality: Right;  . Salivary stone removal  2009  . Removal of tissue expander and placement of implant Bilateral 06/04/2015    Procedure: REMOVAL OF BILATERAL TISSUE EXPANDER AND DELAYED BREAST RECONSTRUCTION PLACEMENT OF IMPLANTS;  Surgeon: Crissie Reese, MD;  Location: [place];  Service: Plastics;  Laterality: Bilateral;  . Port-a-cath removal Right 06/04/2015    Procedure: REMOVAL PORT-A-CATH;  Surgeon: Crissie Reese, MD;  Location: [place];  Service: Plastics;  Laterality: Right;  . Urethral stricture dilatation    . Breast reconstruction Right 07/23/2015    Procedure: RIGHT CHEST WOUND DEBRIDEMENT, RIGHT BREAST RECONSTRUCTION WITH SILICONE GEL IMPLANT;  Surgeon: Crissie Reese, MD;  Location: [place];  Service: Plastics;  Laterality: Right;  . Latissimus flap to breast Right 07/23/2015    Procedure: LATISSIMUS FLAP TO RIGHT CHEST;  Surgeon: Crissie Reese, MD;  Location: [place];  Service: Plastics;  Laterality: Right;    There were no vitals filed for this visit.  Visit Diagnosis:  Stiffness of right shoulder, not elsewhere classified  Stiffness of left shoulder, not elsewhere classified      Subjective Assessment - 09/08/15 1610    Subjective "I'm released.  He took the last few stitches out.  I'm allowed to increase my activity toward normal."  Has  been working on the exercises. Just had a little soreness and still has tightness.  Plans to start some basic Pilates and yoga at home.     Currently in Pain? No/denies                         [facility] Adult PT Treatment/Exercise - 09/08/15 0001    Exercises   Exercises Shoulder   Shoulder Exercises: Supine   Other Supine Exercises supine over towel roll, horizontal abduction stretch x 1 minute, then "V" for  30 seconds+ and then straight flexion x 30 seconds+   Other Supine Exercises supine over towel roll, shoulder extension with scapular retraction, 5 counts x 10.  then active horizontal abduction x 10 reps   Shoulder Exercises: Sidelying   Other Sidelying Exercises active D2 of right arm x 5   Manual Therapy   Myofascial Release right UE myofascial pulling in supine with movement into abduction   Scapular Mobilization to right in left sidelying with rhythmic movements into protraction in varying degrees of right shoulder flexion   Passive ROM In supine to right shoulder and left shoulder for ER, abduction, and flexion.                        [facility] Goals - 08/28/15 1244    CC Long Term Goal  #1   Title pt will have 140 degrees of active shoulder flexion on right for improved overhead reach   Baseline 114 at time of eval on 08/28/15   Time 4   Period Weeks   Status New   CC Long Term Goal  #2   Title pt wil have 140 degrees of active shoulder flexion on the left for improved ADLs   Baseline 87 at time of eval on 08/28/15   Time 4   Period Weeks   Status New   CC Long Term Goal  #3   Title pt will be independent in a home program for shoulder range of motion   Time 4   Period Weeks   Status New   CC Long Term Goal  #4   Title right shoulder external rotation to at least 75 degrees   Baseline 53 at evaluation   Time 4   Period Weeks   Status New   CC Long Term Goal  #5   Title decrease quick DASH score to 10 or less   Baseline 20.45   Time 4   Period Weeks   Status New            Plan - 09/08/15 1720    Clinical Impression Statement Patient continues to tolerate stretching well and to work on HEP on her own.     Pt will benefit from skilled therapeutic intervention in order to improve on the following deficits Decreased range of motion;Decreased strength;Impaired UE functional use   Rehab Potential Excellent   Clinical Impairments Affecting  Rehab Potential Still just 5 weeks out from lat flap reconstruction; previous radiation at right chest   PT Frequency 2x / week   PT Duration 4 weeks   PT Treatment/Interventions Passive range of motion;Manual techniques;Therapeutic exercise   PT Next Visit Plan Remeasure ROM.  Continue P/AA/AROM activities for right shoulder.  Include left shoulder, but focus on right. Go over lymphedema risk reduction and assist with getting a sleeve if desired.   PT Home Exercise Plan Dowel exercises, wall walking in shoulder.  Consulted and Agree with Plan of Care Patient        Problem List Patient Active Problem List   Diagnosis Date Noted  . Breast cancer in female [facility]) 07/23/2015  . Breast cancer, right breast ([place]) 06/04/2015  . Fever 09/14/2014  . Leukopenia 09/14/2014  . Flu-like symptoms 09/14/2014  . Acute bronchitis 09/14/2014  . Breast cancer of lower-outer quadrant of right female breast ([place]) 04/17/2014  . DEPRESSION 08/03/2007  . SWELLING MASS OR LUMP IN HEAD AND NECK 08/03/2007    SALISBURY,DONNA 09/08/2015, 5:22 PM  [place], [place], [address] Phone: [phone]   Fax:  [phone]  Name: Olivia Acosta MRN: [id number] Date of Birth: 11/17/1960    Serafina Royals, PT 09/08/2015 5:22 PM

## 2015-09-25 ENCOUNTER — Telehealth: Payer: Self-pay

## 2015-09-25 NOTE — Telephone Encounter (Signed)
Pt called inquiring about whether bone density test was due.  Her last BD was in Oct 2016.  Pt was at [facility] at time of call in which the staff was questioning the necessity of her scan.  Pt did not end up having the test done until she heard back from [place].  I explained that nothing in [facility]'s notes indicated she was due for another scan so short after her initial scan and thus deemed it unnecessary.  Pt agreed with plan of care and no further questions at time of call.

## 2015-10-26 ENCOUNTER — Ambulatory Visit (HOSPITAL_BASED_OUTPATIENT_CLINIC_OR_DEPARTMENT_OTHER): Payer: BLUE CROSS/BLUE SHIELD | Admitting: Hematology and Oncology

## 2015-10-26 ENCOUNTER — Encounter: Payer: Self-pay | Admitting: Hematology and Oncology

## 2015-10-26 ENCOUNTER — Telehealth: Payer: Self-pay | Admitting: Hematology and Oncology

## 2015-10-26 VITALS — BP 113/75 | HR 70 | Temp 98.4°F | Resp 18 | Wt 168.4 lb

## 2015-10-26 DIAGNOSIS — Z170 Estrogen receptor positive status [ER+]: Secondary | ICD-10-CM | POA: Diagnosis not present

## 2015-10-26 DIAGNOSIS — C50511 Malignant neoplasm of lower-outer quadrant of right female breast: Secondary | ICD-10-CM

## 2015-10-26 DIAGNOSIS — Z79811 Long term (current) use of aromatase inhibitors: Secondary | ICD-10-CM | POA: Diagnosis not present

## 2015-10-26 MED ORDER — ESOMEPRAZOLE MAGNESIUM 20 MG PO CPDR: 20.0000 mg | DELAYED_RELEASE_CAPSULE | Freq: Every day | ORAL | Status: DC

## 2015-10-26 NOTE — Assessment & Plan Note (Signed)
Right breast invasive lobular cancer 5.5 cm multifocal disease with 6/12 lymph nodes positive T3, N2, M0 stage IIIa, ER 92%, PR 98%, HER-2 negative ratio 1.13, Ki-67 10% status post bilateral mastectomies on 04/17/2014, status post adjuvant chemotherapy started 06/12/2014 and completed 10/23/2014, started radiation June 2016 completed August 2016. Started anastrozole 02/02/2015 5 years  Anastrozole toxicities: Denies any hot flashes or myalgias. Tolerating it extremely well.  Breast reconstruction with latissimus dorsi flap done in December 2016 and February 2017.  Patient drinks lots of milk and is quite often outdoors. So we elected to not have her take calcium and vit D. Bone density 03/24/2015 (Dr.Grewall) T score -0.8 normal  RTC one year 

## 2015-10-26 NOTE — Telephone Encounter (Signed)
Mailed appt sched and avs °

## 2015-10-26 NOTE — Progress Notes (Signed)
Patient Care Team: Colon Branch, MD as PCP - General  DIAGNOSIS: Breast cancer of lower-outer quadrant of right female breast [facility])   Staging form: Breast, AJCC 7th Edition     Clinical: Stage IIA (T2(m), N0, M0) - Unsigned     Pathologic: Stage IIIA (T3, N2a, cM0) - Signed by Rulon Eisenmenger, MD on 06/12/2014   SUMMARY OF ONCOLOGIC HISTORY:   Breast cancer of lower-outer quadrant of right female breast ([place])   01/31/2014 Breast MRI Right breast mass 3.3 x 2.8 x 3.4 cm with several foci of subcentimeter enhancing nodules extending anteriorly to worsen nipple up to 5.4 cm anterior to the mass    04/17/2014 Surgery Bilateral mastectomies, left: Benign, right breast multifocal ILC grade 2 largest 5.5 cm, posterior margin focally positive, 2 SLN's positive with extracapsular extension + 4/10 axillary LN positive, ER 92%, PR 98%, HER-2 -1.1.3, Ki 67 10%   06/12/2014 - 10/23/2014 Chemotherapy Adriamycin and Cytoxan dose dense every 2 weeks x4 followed by Taxol x12   09/14/2014 - 09/16/2014 Hospital Admission Neutropenic fever   12/09/2014 - 01/20/2015 Radiation Therapy Adjuvant radiation therapy   02/02/2015 -  Anti-estrogen oral therapy Anastrozole 1 mg daily X 5 years    CHIEF COMPLIANT: Follow-up on anastrozole  INTERVAL HISTORY: Olivia Acosta is a 55 year old with above-mentioned history of right breast cancer currently on anastrozole therapy. She appears to be tolerating it fairly well. She denies any hot flashes or myalgias. She had to undergo breast reconstruction in December as well as in February of this year. She is finally relieved most of the reconstructive process is completed. Continues to have discomfort in the right posterior chest wall area.  REVIEW OF SYSTEMS:   Constitutional: Denies fevers, chills or abnormal weight loss Eyes: Denies blurriness of vision Ears, nose, mouth, throat, and face: Denies mucositis or sore throat Respiratory: Denies cough, dyspnea or wheezes Cardiovascular:  Denies palpitation, chest discomfort Gastrointestinal:  Denies nausea, heartburn or change in bowel habits Skin: Denies abnormal skin rashes Lymphatics: Denies new lymphadenopathy or easy bruising Neurological:Denies numbness, tingling or new weaknesses Behavioral/Psych: Mood is stable, no new changes  Extremities: No lower extremity edema Breast: Pain related to recent breast reconstruction All other systems were reviewed with the patient and are negative.  I have reviewed the past medical history, past surgical history, social history and family history with the patient and they are unchanged from previous note.  ALLERGIES:  is allergic to ciprofloxacin and penicillins.  MEDICATIONS:  Current Outpatient Prescriptions  Medication Sig Dispense Refill  . anastrozole (ARIMIDEX) 1 MG tablet Take 1 tablet (1 mg total) by mouth daily. 90 tablet 3  . valACYclovir (VALTREX) 500 MG tablet Take 500 mg by mouth daily before breakfast.     . esomeprazole (NEXIUM) 20 MG capsule Take 1 capsule (20 mg total) by mouth daily at 12 noon. 30 capsule 1   No current facility-administered medications for this visit.    PHYSICAL EXAMINATION: ECOG PERFORMANCE STATUS: 1 - Symptomatic but completely ambulatory  Filed Vitals:   10/26/15 1454  BP: 113/75  Pulse: 70  Temp: 98.4 F (36.9 C)  Resp: 18   Filed Weights   10/26/15 1454  Weight: 168 lb 6 oz (76.374 kg)    GENERAL:alert, no distress and comfortable SKIN: skin color, texture, turgor are normal, no rashes or significant lesions EYES: normal, Conjunctiva are pink and non-injected, sclera clear OROPHARYNX:no exudate, no erythema and lips, buccal mucosa, and tongue normal  NECK: supple,  thyroid normal size, non-tender, without nodularity LYMPH:  no palpable lymphadenopathy in the cervical, axillary or inguinal LUNGS: clear to auscultation and percussion with normal breathing effort HEART: regular rate & rhythm and no murmurs and no lower  extremity edema ABDOMEN:abdomen soft, non-tender and normal bowel sounds MUSCULOSKELETAL:no cyanosis of digits and no clubbing  NEURO: alert & oriented x 3 with fluent speech, no focal motor/sensory deficits EXTREMITIES: No lower extremity edema  LABORATORY DATA:  I have reviewed the data as listed   Chemistry      Component Value Date/Time   NA 139 07/23/2015 1228   NA 141 10/23/2014 1453   K 4.3 07/23/2015 1228   K 3.9 10/23/2014 1453   CL 102 07/23/2015 1228   CO2 24 07/23/2015 1228   CO2 23 10/23/2014 1453   BUN 13 07/23/2015 1228   BUN 15.4 10/23/2014 1453   CREATININE 0.93 07/23/2015 1228   CREATININE 0.8 10/23/2014 1453      Component Value Date/Time   CALCIUM 9.5 07/23/2015 1228   CALCIUM 8.6 10/23/2014 1453   ALKPHOS 56 11/03/2014 1730   ALKPHOS 60 10/23/2014 1453   AST 23 11/03/2014 1730   AST 20 10/23/2014 1453   ALT 16 11/03/2014 1730   ALT 15 10/23/2014 1453   BILITOT 0.2* 11/03/2014 1730   BILITOT 0.22 10/23/2014 1453       Lab Results  Component Value Date   WBC 4.8 07/23/2015   HGB 11.9* 07/23/2015   HCT 36.6 07/23/2015   MCV 90.8 07/23/2015   PLT 291 07/23/2015   NEUTROABS 3.0 11/03/2014     ASSESSMENT & PLAN:  Breast cancer of lower-outer quadrant of right female breast Right breast invasive lobular cancer 5.5 cm multifocal disease with 6/12 lymph nodes positive T3, N2, M0 stage IIIa, ER 92%, PR 98%, HER-2 negative ratio 1.13, Ki-67 10% status post bilateral mastectomies on 04/17/2014, status post adjuvant chemotherapy started 06/12/2014 and completed 10/23/2014, started radiation June 2016 completed August 2016. Started anastrozole 02/02/2015 5 years  Anastrozole toxicities: Denies any hot flashes or myalgias. Tolerating it extremely well.  Breast reconstruction with latissimus dorsi flap done in December 2016 and February 2017.  Patient drinks lots of milk and is quite often outdoors. So we elected to not have her take calcium and vit  D. Bone density 03/24/2015 (Dr.Grewall) T score -0.8 normal  RTC one year   No orders of the defined types were placed in this encounter.   The patient has a good understanding of the overall plan. she agrees with it. she will call with any problems that may develop before the next visit here.   Rulon Eisenmenger, MD 10/26/2015

## 2016-01-14 ENCOUNTER — Other Ambulatory Visit: Payer: Self-pay | Admitting: Hematology and Oncology

## 2016-01-14 DIAGNOSIS — C50511 Malignant neoplasm of lower-outer quadrant of right female breast: Secondary | ICD-10-CM

## 2016-02-12 ENCOUNTER — Other Ambulatory Visit: Payer: Self-pay | Admitting: Hematology and Oncology

## 2016-02-12 DIAGNOSIS — C50511 Malignant neoplasm of lower-outer quadrant of right female breast: Secondary | ICD-10-CM

## 2016-03-08 ENCOUNTER — Other Ambulatory Visit: Payer: Self-pay

## 2016-03-08 DIAGNOSIS — C50511 Malignant neoplasm of lower-outer quadrant of right female breast: Secondary | ICD-10-CM

## 2016-03-08 DIAGNOSIS — N63 Unspecified lump in unspecified breast: Secondary | ICD-10-CM

## 2016-03-08 NOTE — Progress Notes (Addendum)
Received VM from pt stating she found a new lump this morning and is concerned.  Pt reports the new lump is a small lump located on her right breast bone above her mastectomy scar.  Pt reports she is especially concerned as this lump presents similarly to her previous breast CA diagnosis.  Reviewed information with Dr. Lindi Adie and verbal order given for pt to have bilateral mammo and [place] with biopsy if needed.  Called GI to schedule first available and appt obtained for tomorrow morning at 1010.  Called to discuss with pt.  Pt confirmed imaging appt at GI tomorrow and will be seen by Dr. Lindi Adie for follow up at the beginning of next week.  Pt verbalized understanding and is without further questions or concerns at this time.

## 2016-03-09 ENCOUNTER — Telehealth: Payer: Self-pay | Admitting: Hematology and Oncology

## 2016-03-09 ENCOUNTER — Other Ambulatory Visit: Payer: Self-pay | Admitting: Hematology and Oncology

## 2016-03-09 ENCOUNTER — Ambulatory Visit
Admission: RE | Admit: 2016-03-09 | Discharge: 2016-03-09 | Disposition: A | Discharge status: Home / Self Care | Payer: BLUE CROSS/BLUE SHIELD | Source: Ambulatory Visit | Attending: Hematology and Oncology | Admitting: Hematology and Oncology

## 2016-03-09 DIAGNOSIS — C50511 Malignant neoplasm of lower-outer quadrant of right female breast: Secondary | ICD-10-CM

## 2016-03-09 DIAGNOSIS — N63 Unspecified lump in unspecified breast: Secondary | ICD-10-CM

## 2016-03-09 NOTE — Telephone Encounter (Signed)
lvm to inform pt of 10/9 appt per LOS

## 2016-03-11 ENCOUNTER — Ambulatory Visit
Admission: RE | Admit: 2016-03-11 | Discharge: 2016-03-11 | Disposition: A | Discharge status: Home / Self Care | Payer: BLUE CROSS/BLUE SHIELD | Source: Ambulatory Visit | Attending: Hematology and Oncology | Admitting: Hematology and Oncology

## 2016-03-11 ENCOUNTER — Other Ambulatory Visit: Payer: BLUE CROSS/BLUE SHIELD

## 2016-03-11 DIAGNOSIS — N63 Unspecified lump in unspecified breast: Secondary | ICD-10-CM

## 2016-03-11 DIAGNOSIS — C50511 Malignant neoplasm of lower-outer quadrant of right female breast: Secondary | ICD-10-CM

## 2016-03-14 ENCOUNTER — Encounter: Payer: Self-pay | Admitting: Medical Oncology

## 2016-03-14 ENCOUNTER — Ambulatory Visit: Payer: BLUE CROSS/BLUE SHIELD

## 2016-03-14 ENCOUNTER — Ambulatory Visit (HOSPITAL_BASED_OUTPATIENT_CLINIC_OR_DEPARTMENT_OTHER): Payer: BLUE CROSS/BLUE SHIELD | Admitting: Hematology and Oncology

## 2016-03-14 ENCOUNTER — Encounter: Payer: Self-pay | Admitting: Hematology and Oncology

## 2016-03-14 DIAGNOSIS — Z79811 Long term (current) use of aromatase inhibitors: Secondary | ICD-10-CM

## 2016-03-14 DIAGNOSIS — Z170 Estrogen receptor positive status [ER+]: Secondary | ICD-10-CM

## 2016-03-14 DIAGNOSIS — C50511 Malignant neoplasm of lower-outer quadrant of right female breast: Secondary | ICD-10-CM

## 2016-03-14 DIAGNOSIS — C773 Secondary and unspecified malignant neoplasm of axilla and upper limb lymph nodes: Secondary | ICD-10-CM

## 2016-03-14 LAB — RESEARCH LABS

## 2016-03-14 NOTE — Progress Notes (Signed)
Patient Care Team: Colon Branch, MD as PCP - General  DIAGNOSIS: Breast cancer of lower-outer quadrant of right female breast [facility])   Staging form: Breast, AJCC 7th Edition   - Clinical: Stage IIA (T2(m), N0, M0) - Unsigned   - Pathologic: Stage IIIA (T3, N2a, cM0) - Signed by Rulon Eisenmenger, MD on 06/12/2014  SUMMARY OF ONCOLOGIC HISTORY:   Breast cancer of lower-outer quadrant of right female breast ([place])   01/31/2014 Breast MRI    Right breast mass 3.3 x 2.8 x 3.4 cm with several foci of subcentimeter enhancing nodules extending anteriorly to worsen nipple up to 5.4 cm anterior to the mass       04/17/2014 Surgery    Bilateral mastectomies, left: Benign, right breast multifocal ILC grade 2 largest 5.5 cm, posterior margin focally positive, 2 SLN's positive with extracapsular extension + 4/10 axillary LN positive, ER 92%, PR 98%, HER-2 -1.1.3, Ki 67 10%      06/12/2014 - 10/23/2014 Chemotherapy    Adriamycin and Cytoxan dose dense every 2 weeks x4 followed by Taxol x12      09/14/2014 - 09/16/2014 Hospital Admission    Neutropenic fever      12/09/2014 - 01/20/2015 Radiation Therapy    Adjuvant radiation therapy      02/02/2015 -  Anti-estrogen oral therapy    Anastrozole 1 mg daily X 5 years       CHIEF COMPLIANT: follow-up on anastrozole therapy  INTERVAL HISTORY: Olivia Acosta is a 55 year old with above-mentioned history of right breast cancer is currently on anastrozole therapy. She is tolerating anastrozole extremely well. She reports no major problems or concerns. She denies any hot flashes or myalgias. She had undergone breast reconstruction and appears to be doing quite well. She felt a nodule between the breast which was evaluated by ultrasound and biopsy. The biopsy came back as fat necrosis. Her major concern today is the weight gain. She has exercising 3 times a week by running and lifting weights. In spite of this she is continuing to gain weight.  REVIEW OF SYSTEMS:    Constitutional: Denies fevers, chills or abnormal weight loss Eyes: Denies blurriness of vision Ears, nose, mouth, throat, and face: Denies mucositis or sore throat Respiratory: Denies cough, dyspnea or wheezes Cardiovascular: Denies palpitation, chest discomfort Gastrointestinal:  Denies nausea, heartburn or change in bowel habits Skin: Denies abnormal skin rashes Lymphatics: Denies new lymphadenopathy or easy bruising Neurological:Denies numbness, tingling or new weaknesses Behavioral/Psych: Mood is stable, no new changes  Extremities: No lower extremity edema Breast: bilateral mastectomies with reconstruction All other systems were reviewed with the patient and are negative.  I have reviewed the past medical history, past surgical history, social history and family history with the patient and they are unchanged from previous note.  ALLERGIES:  is allergic to ciprofloxacin and penicillins.  MEDICATIONS:  Current Outpatient Prescriptions  Medication Sig Dispense Refill  . anastrozole (ARIMIDEX) 1 MG tablet TAKE 1 TABLET(1 MG) BY MOUTH DAILY 90 tablet 3  . esomeprazole (NEXIUM) 20 MG capsule TAKE 1 CAPSULE BY MOUTH EVERY DAY AT 12 NOON 30 capsule 2  . valACYclovir (VALTREX) 500 MG tablet Take 500 mg by mouth daily before breakfast.      No current facility-administered medications for this visit.     PHYSICAL EXAMINATION: ECOG PERFORMANCE STATUS: 0 - Asymptomatic  Vitals:   03/14/16 1546  BP: 113/73  Pulse: (!) 58  Resp: 18  Temp: 98.2 F (36.8 C)  Filed Weights   03/14/16 1546  Weight: 173 lb (78.5 kg)    GENERAL:alert, no distress and comfortable SKIN: skin color, texture, turgor are normal, no rashes or significant lesions EYES: normal, Conjunctiva are pink and non-injected, sclera clear OROPHARYNX:no exudate, no erythema and lips, buccal mucosa, and tongue normal  NECK: supple, thyroid normal size, non-tender, without nodularity LYMPH:  no palpable  lymphadenopathy in the cervical, axillary or inguinal LUNGS: clear to auscultation and percussion with normal breathing effort HEART: regular rate & rhythm and no murmurs and no lower extremity edema ABDOMEN:abdomen soft, non-tender and normal bowel sounds MUSCULOSKELETAL:no cyanosis of digits and no clubbing  NEURO: alert & oriented x 3 with fluent speech, no focal motor/sensory deficits EXTREMITIES: No lower extremity edema BREAST:no palpable lumps or nodules in bilateral reconstructed breasts or axilla. (exam performed in the presence of a chaperone)  LABORATORY DATA:  I have reviewed the data as listed   Chemistry      Component Value Date/Time   NA 139 07/23/2015 1228   NA 141 10/23/2014 1453   K 4.3 07/23/2015 1228   K 3.9 10/23/2014 1453   CL 102 07/23/2015 1228   CO2 24 07/23/2015 1228   CO2 23 10/23/2014 1453   BUN 13 07/23/2015 1228   BUN 15.4 10/23/2014 1453   CREATININE 0.93 07/23/2015 1228   CREATININE 0.8 10/23/2014 1453      Component Value Date/Time   CALCIUM 9.5 07/23/2015 1228   CALCIUM 8.6 10/23/2014 1453   ALKPHOS 56 11/03/2014 1730   ALKPHOS 60 10/23/2014 1453   AST 23 11/03/2014 1730   AST 20 10/23/2014 1453   ALT 16 11/03/2014 1730   ALT 15 10/23/2014 1453   BILITOT 0.2 (L) 11/03/2014 1730   BILITOT 0.22 10/23/2014 1453       Lab Results  Component Value Date   WBC 4.8 07/23/2015   HGB 11.9 (L) 07/23/2015   HCT 36.6 07/23/2015   MCV 90.8 07/23/2015   PLT 291 07/23/2015   NEUTROABS 3.0 11/03/2014   ASSESSMENT & PLAN:  Breast cancer of lower-outer quadrant of right female breast Right breast invasive lobular cancer 5.5 cm multifocal disease with 6/12 lymph nodes positive T3, N2, M0 stage IIIa, ER 92%, PR 98%, HER-2 negative ratio 1.13, Ki-67 10% status post bilateral mastectomies on 04/17/2014, status post adjuvant chemotherapy started 06/12/2014 and completed 10/23/2014, started radiation June 2016 completed August 2016. Started anastrozole  02/02/2015 5 years  Anastrozole toxicities: Denies any hot flashes or myalgias. Tolerating it extremely well.  Breast reconstruction with latissimus dorsi flap done in December 2016 and February 2017.  Patient drinks lots of milk and is quite often outdoors. So we elected to not have her take calcium and vit D. Bone density 03/24/2015 (Dr.Grewall) T score -0.8 normal  Breast Cancer Surveillance: 1. Breast exam 03/14/2016: No palpable lumps or nodules in chest wall or axilla 2. Mammogram no role of mammogram since she had bilateral mastectomies   REMEMBER clinical trial: I offered her participation in this trial. She does complain of memory dysfunction. She will be evaluated with the screening evaluation and if she qualifies she will be offered the clinical trial. ---------------------------------------------------------------------------------------------------------------------------------------------------------------------------------  Consent for research study: Procurement of human bio specimens for the discovery and validation of biomarkers for the prediction, diagnosis and management of disease  Met with patient (alone) for 30 minutes to review the pathology specimen consent form and HIPAA. Study involves a one-time blood draw. Patient was told of the voluntary nature  of the study, risks, benefits, and alternatives to participation. All of the patient's questions were answered, and the patient agreed to participate in the study. The consent and HIPAA forms were signed and dated by the patient. All eligibility criteria have been met and patient has been enrolled in the pathology procurement study. Research staff will provide the patient with a copy of the consent form and HIPAA document and give the patient a gift card after the blood has been  obtained. ----------------------------------------------------------------------------------------------------------------------------------------------------------------------------------  RTC one year   Orders Placed This Encounter  Procedures  . Research Labs    [employer] study    Standing Status:   Future    Number of Occurrences:   1    Standing Expiration Date:   03/14/2017   The patient has a good understanding of the overall plan. she agrees with it. she will call with any problems that may develop before the next visit here.   Rulon Eisenmenger, MD 03/14/16

## 2016-03-14 NOTE — Assessment & Plan Note (Signed)
Right breast invasive lobular cancer 5.5 cm multifocal disease with 6/12 lymph nodes positive T3, N2, M0 stage IIIa, ER 92%, PR 98%, HER-2 negative ratio 1.13, Ki-67 10% status post bilateral mastectomies on 04/17/2014, status post adjuvant chemotherapy started 06/12/2014 and completed 10/23/2014, started radiation June 2016 completed August 2016. Started anastrozole 02/02/2015 5 years  Anastrozole toxicities: Denies any hot flashes or myalgias. Tolerating it extremely well.  Breast reconstruction with latissimus dorsi flap done in December 2016 and February 2017.  Patient drinks lots of milk and is quite often outdoors. So we elected to not have her take calcium and vit D. Bone density 03/24/2015 (Dr.Grewall) T score -0.8 normal  RTC one year 

## 2016-04-12 ENCOUNTER — Encounter: Payer: BLUE CROSS/BLUE SHIELD | Admitting: Medical Oncology

## 2016-05-27 ENCOUNTER — Other Ambulatory Visit: Payer: Self-pay | Admitting: Nurse Practitioner

## 2016-07-19 IMAGING — MR MR BREAST BILATERAL W WO CONTRAST
9 of 13 series · 32 of 48 positions shown · IV contrast (multihance)
Comparison: Previous exams

CLINICAL DATA: Recent diagnosis of right invasive mammary
carcinoma.

LABS:  Does not apply
EXAM:
BILATERAL BREAST MRI WITH AND WITHOUT CONTRAST
TECHNIQUE: Multiplanar, multisequence MR images of both breasts were obtained
prior to and following the intravenous administration of 13ml of
MultiHance.

[Series 2: T2 · axial · 3.0mm · 0.94mm/px · z∈[-80,+97]mm · 3 of 60 slices shown]
[im 1/60]
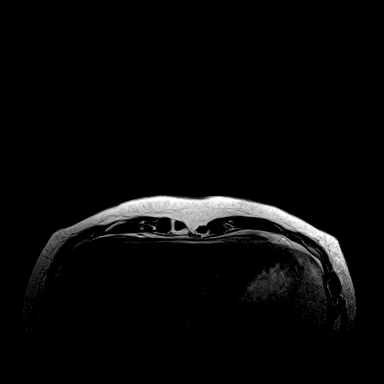
[im 30/60]
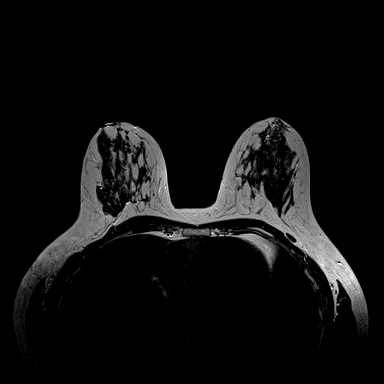
[im 60/60]
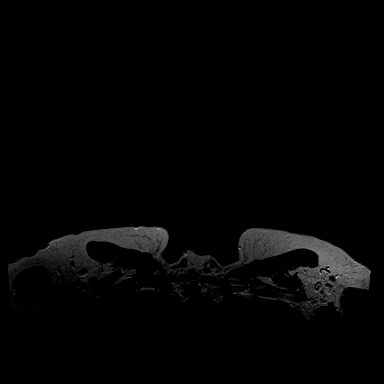

[Series 3: t2_tirm_tra ipat (a-p) · axial · 3.0mm · 0.70mm/px · z∈[-73,+89]mm · 2 of 55 slices shown]
[im 1/55]
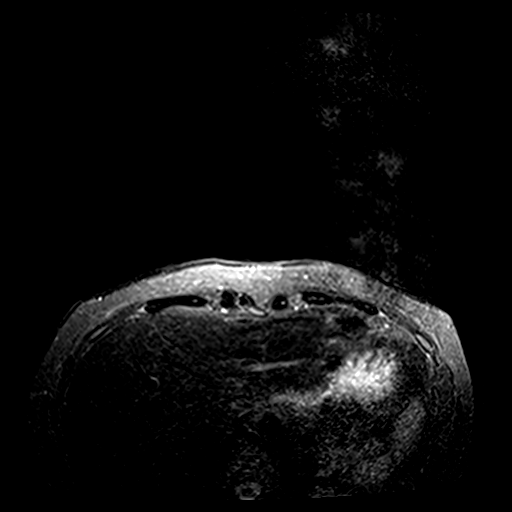
[im 55/55]
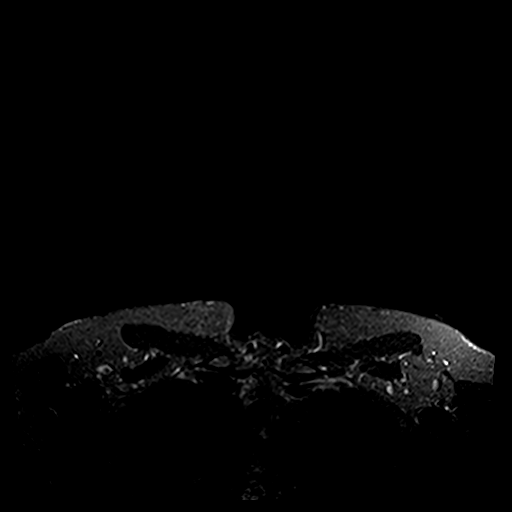

[Series 4: fl3d pre-cm no · axial · non-contrast · 1.2mm · 0.94mm/px · z∈[-77,+94]mm · 5 of 144 slices shown]
[im 1/144]
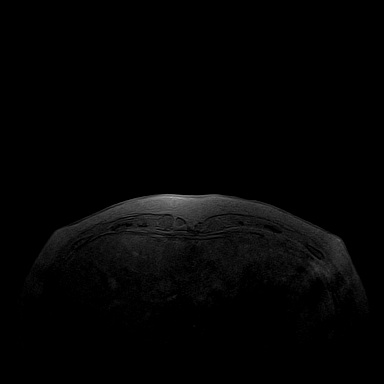
[im 36/144]
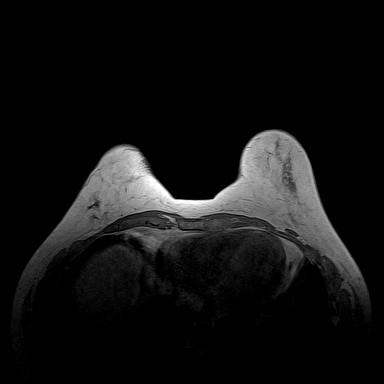
[im 72/144]
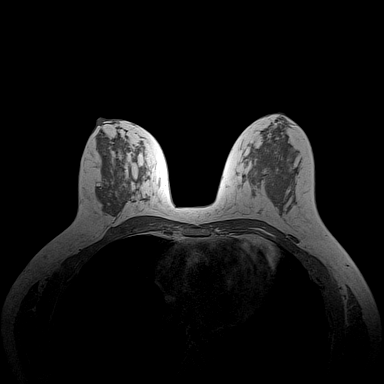
[im 108/144]
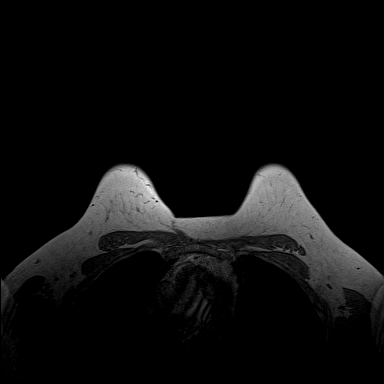
[im 144/144]
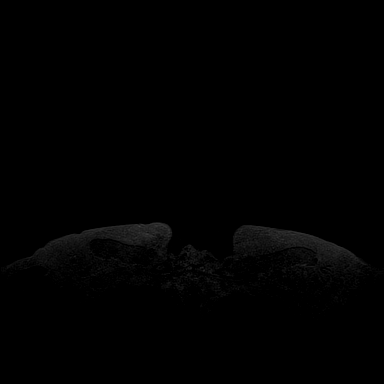

[Series 5: fl3d pre-cm · axial · non-contrast · 1.2mm · 0.94mm/px · z∈[-77,+94]mm · 5 of 144 slices shown]
[im 1/144]
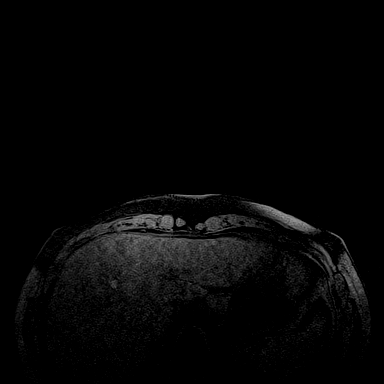
[im 36/144]
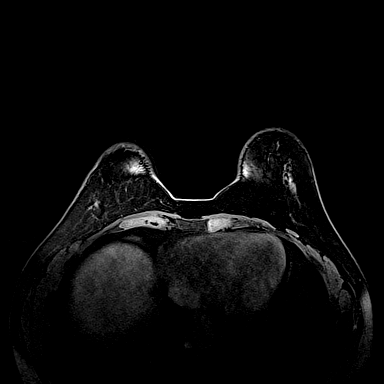
[im 72/144]
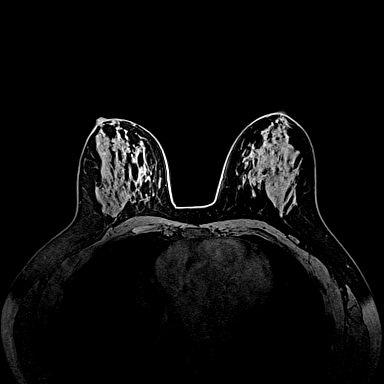
[im 108/144]
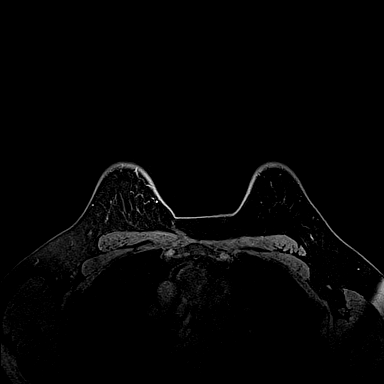
[im 144/144]
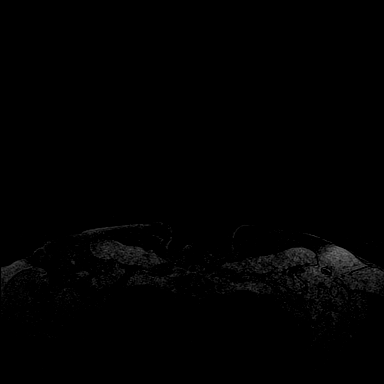

[Series 6: fl3d post-cm 20 · axial · 1.2mm · 0.94mm/px · z∈[-77,+94]mm · 5 of 144 slices shown (1 of 3)]
[im 1/144]
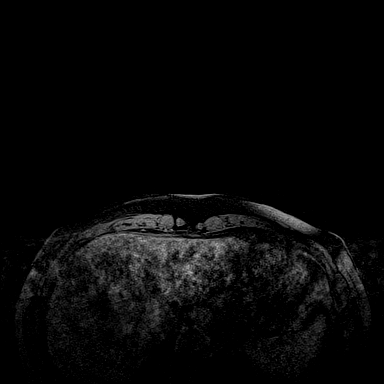
[im 36/144]
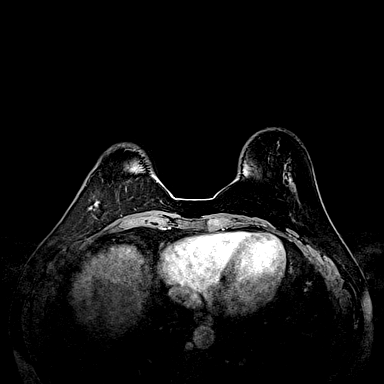
[im 72/144]
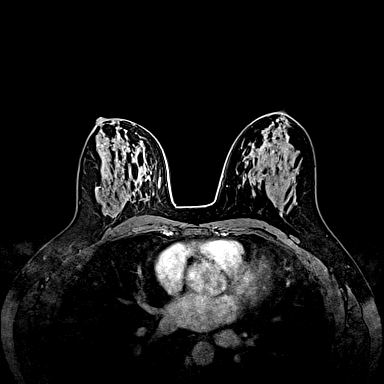
[im 108/144]
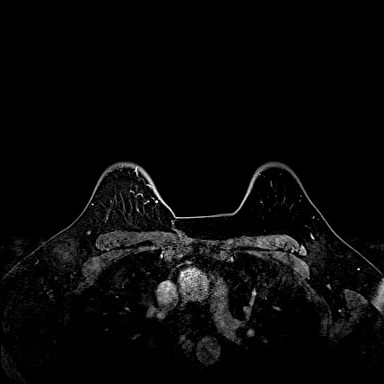
[im 144/144]
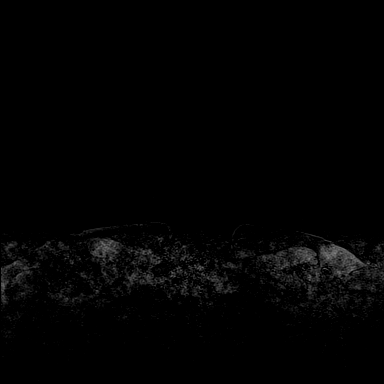

[Series 7: fl3d post-cm 20 · axial · 1.2mm · 0.94mm/px · z∈[-77,+94]mm · 5 of 144 slices shown (2 of 3)]
[im 1/144]
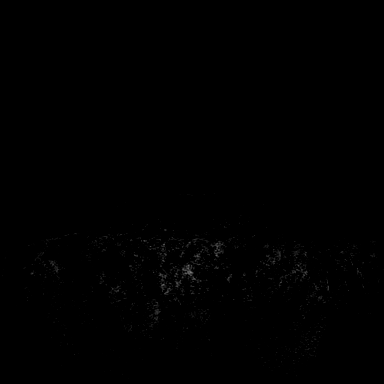
[im 36/144]
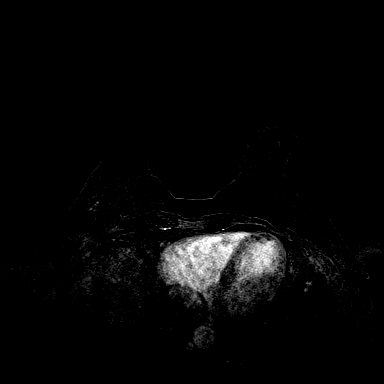
[im 72/144]
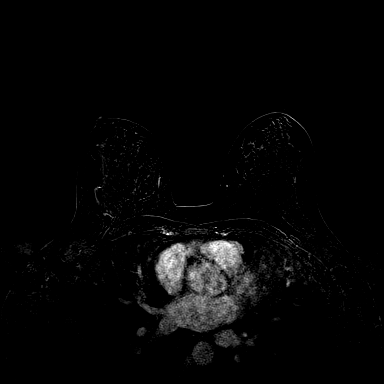
[im 108/144]
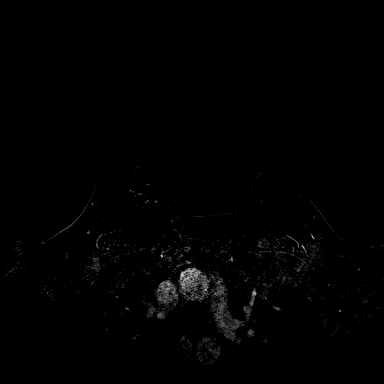
[im 144/144]
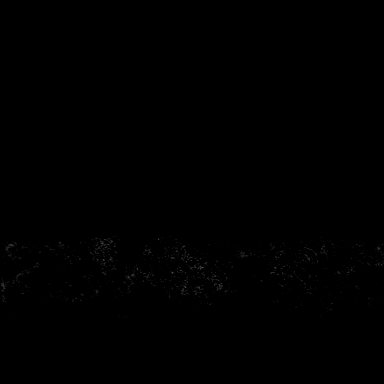

[Series 8: fl3d post-cm 20 · axial · 172.8mm · 0.94mm/px · 1 of 1 slices shown (3 of 3)]
[im 1/1]
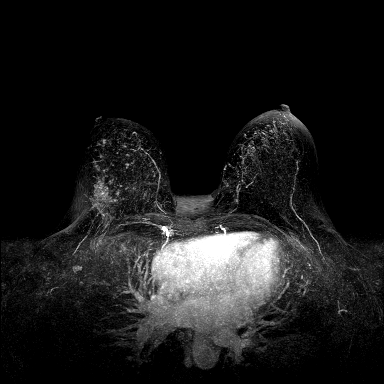

[Series 9: fl3d post-cm 3min · axial · 1.2mm · 0.94mm/px · z∈[-77,+94]mm · 5 of 144 slices shown]
[im 1/144]
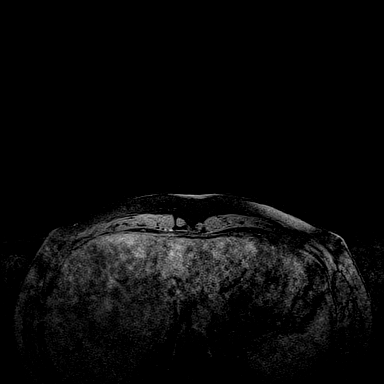
[im 36/144]
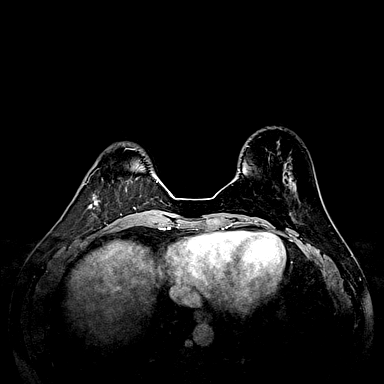
[im 72/144]
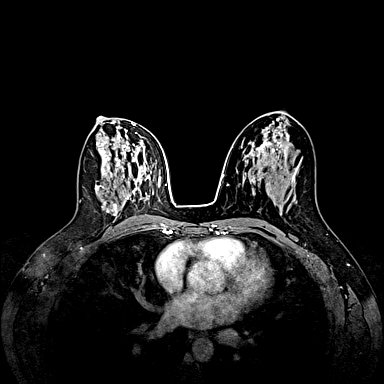
[im 108/144]
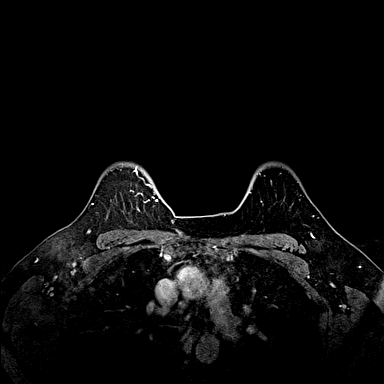
[im 144/144]
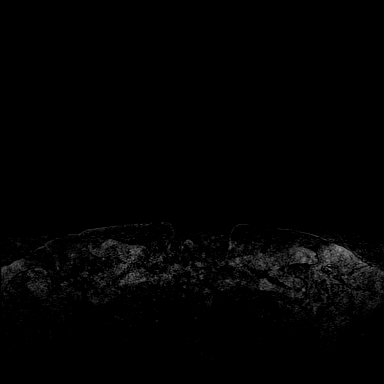

[Series 10: fl3d post-cm 3min_sub · axial · 1.2mm · 0.94mm/px · 1 of 144 slices shown]
[im 1/144]
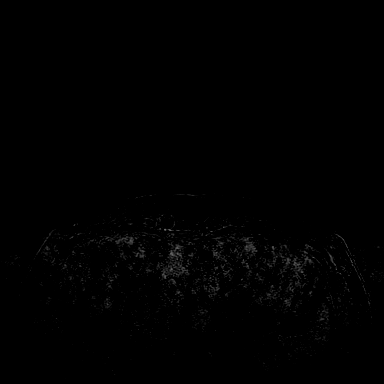

[32 of 48 positions shown; findings below may reference images not displayed]

THREE-DIMENSIONAL MR IMAGE RENDERING ON INDEPENDENT WORKSTATION:

Three-dimensional MR images were rendered by post-processing of the
original MR data on an independent workstation. The
three-dimensional MR images were interpreted, and findings are
reported in the following complete MRI report for this study. Three
dimensional images were evaluated at the independent [employer]
workstation
FINDINGS: Breast composition: c:  Heterogeneous fibroglandular tissue

Background parenchymal enhancement: mild

Right breast: In the lateral posterior right breast, there is
spiculated enhancing mass with washout kinetics measuring 3.3 cm AP,
2.8 cm diameter, 3.4 cm craniocaudal with a biopsy clip consistent
with known right breast carcinoma. There are several foci of
subcentimeter enhancing nodules extending from the anterior aspect
of the predominant mass toward the nipple in the lateral portion of
the right breast. The most anterior subcentimeter nodule is 5.4 cm
anterior to the anterior aspect of the pre dominant mass.

Left breast: No mass or abnormal enhancement.

Lymph nodes: No abnormal appearing lymph nodes.

Ancillary findings:  None.
IMPRESSION: Spiculated 3.3 x 2.8 x 3.4 cm mass in the lateral posterior right
breast consistent with patient's known cancer. There are several
foci of sub centimeter enhancing nodules extending from the anterior
aspect of the pre dominant mass towards the nipple.

RECOMMENDATION:
MRI guided core biopsy of the most anterior subcentimeter nodule in
the right breast to assess extent of disease.

BI-RADS CATEGORY  6: Known biopsy-proven malignancy.

## 2016-08-22 ENCOUNTER — Telehealth: Payer: Self-pay

## 2016-08-22 NOTE — Telephone Encounter (Signed)
Pt saw Gyn and he found low thyroid. She is now on synthroid 25 daily. She had thinning hair, and weight gain and was not really aware. She has 10 lb weight gain over the last year. She does see endocrinologist in April.  She is dieting and exercising. She is talking about cutting out "white food", and eating protein, vegetables and fruit. She has appt with Dr Lindi Adie in May. She is wondering if there is anything else she can do for keeping the weight off.

## 2016-08-22 NOTE — Telephone Encounter (Signed)
Pt seems to be doing everything she needs to do with her weight control. She can see a nutritionist to help with proper diet or join weight watchers. She can call with additional questions.

## 2016-09-19 ENCOUNTER — Ambulatory Visit
Admission: RE | Admit: 2016-09-19 | Discharge: 2016-09-19 | Disposition: A | Discharge status: Home / Self Care | Payer: Medicaid Other | Source: Ambulatory Visit | Attending: Sports Medicine | Admitting: Sports Medicine

## 2016-09-19 ENCOUNTER — Ambulatory Visit (INDEPENDENT_AMBULATORY_CARE_PROVIDER_SITE_OTHER): Payer: Medicaid Other | Admitting: Sports Medicine

## 2016-09-19 ENCOUNTER — Encounter: Payer: Self-pay | Admitting: Sports Medicine

## 2016-09-19 VITALS — BP 114/93 | Ht 66.5 in | Wt 170.0 lb

## 2016-09-19 DIAGNOSIS — M79672 Pain in left foot: Secondary | ICD-10-CM

## 2016-09-19 NOTE — Progress Notes (Signed)
[facility] Progress Note  Subjective:  Olivia Acosta is a 56 y.o. 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 Pain has been present since last October/November. It is localized to lateral left foot. She participated in two 5Ks last fall and had been running about 4 times a week since August after a 2-year hiatus from running due to cancer treatments. She stopped running last fall, and pain improved. She does not have pain using elliptical or glider or with yoga but does hurt after she takes off her sneakers. Wearing high heels brings on the pain. At night, patient aware of pains "like a tooth ache" over same aspect of L foot. Denies any known injuries like ankle [place] and never noticed pain or swelling at the area. She had been reading up on possible causes on the internet and is concerned about Dancer's or Jones fracture. She has not had any imaging of the area.   ROS: No fevers, no weakness  Objective: Blood pressure (!) 114/93, height 5' 6.5" (1.689 m), weight 170 lb (77.1 kg), last menstrual period 05/28/2011. Body mass index is 27.03 kg/m. Constitutional: Pleasant, overweight female in NAD Pulmonary/Chest: No respiratory distress.  Abdominal: Soft. +BS, soft, NT, ND, no rebound or guarding.  Musculoskeletal: No swelling or bruising of L foot. Dropped transverse arch and claw toes 2nd-4th. Point TTP just anterior to L 5th metatarsal styloid process. Mild TTP over L midfoot. FROM and strength with dorsi and plantar flexion.  Neurological: Decreased sensation of feet but none of hands.  Skin: Skin is warm and dry. No rash noted. No erythema.  Psychiatric: Normal mood and affect.  Vitals reviewed  Assessment/Plan: Left foot pain - Pain localized at base of 5th metatarsal. Low suspicion for Jones or Dancer's fracture given lack of known injury.  - Will obtain L foot x-ray (AP, lateral, oblique) to look  for possible stress fracture - If negative imaging, would benefit from gait analysis and possible custom orthotics   Follow-up pending x-ray results.  Olene Floss, MD [place], PGY-2  Patient seen and evaluated with the resident. I agree with the above plan of care. Patient's x-rays are negative for fifth metatarsal stress fracture. She will return to the office sometime in the next week or two with her running shoes for gait analysis and orthotics.

## 2016-09-19 NOTE — Assessment & Plan Note (Signed)
-   Pain localized at base of 5th metatarsal. Low suspicion for Jones or Dancer's fracture given lack of known injury.  - Will obtain L foot x-ray (AP, lateral, oblique) to look for possible stress fracture - If negative imaging, would benefit from gait analysis and possible custom orthotics

## 2016-10-04 ENCOUNTER — Ambulatory Visit: Payer: Medicaid Other | Admitting: Sports Medicine

## 2016-10-11 ENCOUNTER — Ambulatory Visit (INDEPENDENT_AMBULATORY_CARE_PROVIDER_SITE_OTHER): Payer: Medicaid Other | Admitting: Sports Medicine

## 2016-10-11 ENCOUNTER — Encounter: Payer: Self-pay | Admitting: Sports Medicine

## 2016-10-11 VITALS — BP 120/80 | Ht 66.5 in | Wt 170.0 lb

## 2016-10-11 DIAGNOSIS — M79672 Pain in left foot: Secondary | ICD-10-CM

## 2016-10-11 NOTE — Progress Notes (Signed)
   Subjective:    Patient ID: Olivia Acosta, female    DOB: 1961-04-14, 56 y.o.   MRN: [id number]  HPI   Patient comes in today for follow-up on lateral left foot pain. X-rays of the left foot show no obvious fracture. She continues to have pain that she localizes at the base of the fifth metatarsal on the left foot. Present somewhat with walking but really bothersome with running. She has not been able to run due to her pain. She is here today for gait analysis and possible shoe inserts.    Review of Systems    as above Objective:   Physical Exam  Well-developed, well-nourished. No acute distress. Awake alert and oriented 3. Vital signs reviewed  Examination of her feet in the standing position shows mild collapse of the longitudinal arch is significant collapse of the transverse arches bilaterally. Small bunionette deformity at the left fifth metatarsal head. Slight tenderness to palpation at the base of the fifth metatarsal. No soft tissue swelling. Evaluation of her running gait shows pronation but no limp.      Assessment & Plan:   Lateral left foot pain secondary to pronation and transverse arch collapse  Patient's running shoes were fitted with green sports insoles with scaphoid pads and metatarsal pads. She found the metatarsal pads to be very comfortable and was able to run afterwards with minimal discomfort. She'll follow-up with me in the office in 3-4 weeks. If she likes her temporary inserts then we will proceed with custom orthotics at that time. I've also recommended that she get running shoes with a wide toe box. She is okay to continue with her recreational walking and may start a run/walk program as her foot pain allows.

## 2016-10-24 ENCOUNTER — Encounter: Payer: Self-pay | Admitting: Hematology and Oncology

## 2016-10-24 ENCOUNTER — Ambulatory Visit (HOSPITAL_BASED_OUTPATIENT_CLINIC_OR_DEPARTMENT_OTHER): Payer: Medicaid Other | Admitting: Hematology and Oncology

## 2016-10-24 DIAGNOSIS — Z170 Estrogen receptor positive status [ER+]: Secondary | ICD-10-CM | POA: Diagnosis not present

## 2016-10-24 DIAGNOSIS — C50511 Malignant neoplasm of lower-outer quadrant of right female breast: Secondary | ICD-10-CM | POA: Diagnosis present

## 2016-10-24 DIAGNOSIS — Z79811 Long term (current) use of aromatase inhibitors: Secondary | ICD-10-CM | POA: Diagnosis not present

## 2016-10-24 DIAGNOSIS — C773 Secondary and unspecified malignant neoplasm of axilla and upper limb lymph nodes: Secondary | ICD-10-CM | POA: Diagnosis not present

## 2016-10-24 NOTE — Progress Notes (Signed)
Patient Care Team: Olivia Queen, MD as PCP - General (Obstetrics and Gynecology)  DIAGNOSIS:  Encounter Diagnosis  Name Primary?  . Malignant neoplasm of lower-outer quadrant of right breast of female, estrogen receptor positive (Olivia Acosta)     SUMMARY OF ONCOLOGIC HISTORY:   Breast cancer of lower-outer quadrant of right female breast (Olivia Acosta)   01/31/2014 Breast MRI    Right breast mass 3.3 x 2.8 x 3.4 cm with several foci of subcentimeter enhancing nodules extending anteriorly to worsen nipple up to 5.4 cm anterior to the mass       04/17/2014 Surgery    Bilateral mastectomies, left: Benign, right breast multifocal ILC grade 2 largest 5.5 cm, posterior margin focally positive, 2 SLN's positive with extracapsular extension + 4/10 axillary LN positive, ER 92%, PR 98%, HER-2 -1.1.3, Ki 67 10%      06/12/2014 - 10/23/2014 Chemotherapy    Adriamycin and Cytoxan dose dense every 2 weeks x4 followed by Taxol x12      09/14/2014 - 09/16/2014 Hospital Admission    Neutropenic fever      12/09/2014 - 01/20/2015 Radiation Therapy    Adjuvant radiation therapy      02/02/2015 -  Anti-estrogen oral therapy    Anastrozole 1 mg daily X 5 years       CHIEF COMPLIANT: Follow-up on anastrozole therapy  INTERVAL HISTORY: Olivia Acosta is a 56 year old with above-mentioned history of the lateral mastectomies right-sided breast cancer who received adjuvant chemotherapy and radiation. She is currently on anastrozole therapy. She is tolerating anastrozole fairly well except for early morning stiffness in her hands and her back. She denies any hot flashes or myalgias. She recently took up a job in [place] for [employer].   REVIEW OF SYSTEMS:   Constitutional: Denies fevers, chills or abnormal weight loss Eyes: Denies blurriness of vision Ears, nose, mouth, throat, and face: Denies mucositis or sore throat Respiratory: Denies cough, dyspnea or wheezes Cardiovascular: Denies palpitation, chest  discomfort Gastrointestinal:  Denies nausea, heartburn or change in bowel habits Skin: Denies abnormal skin rashes Lymphatics: Denies new lymphadenopathy or easy bruising Neurological:Denies numbness, tingling or new weaknesses Behavioral/Psych: Mood is stable, no new changes  Extremities: No lower extremity edema Breast:  denies any pain or lumps or nodules in eitherreconstructed breasts  All other systems were reviewed with the patient and are negative.  I have reviewed the past medical history, past surgical history, social history and family history with the patient and they are unchanged from previous note.  ALLERGIES:  is allergic to ciprofloxacin and penicillins.  MEDICATIONS:  Current Outpatient Prescriptions  Medication Sig Dispense Refill  . acetaminophen (TYLENOL) 500 MG tablet Take by mouth.    Marland Kitchen anastrozole (ARIMIDEX) 1 MG tablet TAKE 1 TABLET(1 MG) BY MOUTH DAILY 90 tablet 3  . anastrozole (ARIMIDEX) 1 MG tablet     . esomeprazole (NEXIUM) 20 MG capsule TAKE 1 CAPSULE BY MOUTH EVERY DAY AT 12 NOON 30 capsule 2  . esomeprazole (NEXIUM) 20 MG capsule Take by mouth.    . levothyroxine (SYNTHROID, LEVOTHROID) 25 MCG tablet Take 25 mcg by mouth daily before breakfast.    . levothyroxine (SYNTHROID, LEVOTHROID) 50 MCG tablet TK 1 T PO QD  6  . Probiotic Product (EQ PROBIOTIC) CAPS Take by mouth.    . valACYclovir (VALTREX) 500 MG tablet Take 500 mg by mouth daily before breakfast.     . valACYclovir (VALTREX) 500 MG tablet Take by mouth.    . zolpidem (  AMBIEN) 10 MG tablet Take by mouth.     No current facility-administered medications for this visit.     PHYSICAL EXAMINATION: ECOG PERFORMANCE STATUS: 1 - Symptomatic but completely ambulatory  Vitals:   10/24/16 1519  BP: 105/72  Pulse: 63  Resp: 18  Temp: 98.4 F (36.9 C)   Filed Weights   10/24/16 1519  Weight: 176 lb 8 oz (80.1 kg)    GENERAL:alert, no distress and comfortable SKIN: skin color, texture,  turgor are normal, no rashes or significant lesions EYES: normal, Conjunctiva are pink and non-injected, sclera clear OROPHARYNX:no exudate, no erythema and lips, buccal mucosa, and tongue normal  NECK: supple, thyroid normal size, non-tender, without nodularity LYMPH:  no palpable lymphadenopathy in the cervical, axillary or inguinal LUNGS: clear to auscultation and percussion with normal breathing effort HEART: regular rate & rhythm and no murmurs and no lower extremity edema ABDOMEN:abdomen soft, non-tender and normal bowel sounds MUSCULOSKELETAL:no cyanosis of digits and no clubbing  NEURO: alert & oriented x 3 with fluent speech, no focal motor/sensory deficits EXTREMITIES: No lower extremity edema  LABORATORY DATA:  I have reviewed the data as listed   Chemistry      Component Value Date/Time   NA 139 07/23/2015 1228   NA 141 10/23/2014 1453   K 4.3 07/23/2015 1228   K 3.9 10/23/2014 1453   CL 102 07/23/2015 1228   CO2 24 07/23/2015 1228   CO2 23 10/23/2014 1453   BUN 13 07/23/2015 1228   BUN 15.4 10/23/2014 1453   CREATININE 0.93 07/23/2015 1228   CREATININE 0.8 10/23/2014 1453      Component Value Date/Time   CALCIUM 9.5 07/23/2015 1228   CALCIUM 8.6 10/23/2014 1453   ALKPHOS 56 11/03/2014 1730   ALKPHOS 60 10/23/2014 1453   AST 23 11/03/2014 1730   AST 20 10/23/2014 1453   ALT 16 11/03/2014 1730   ALT 15 10/23/2014 1453   BILITOT 0.2 (L) 11/03/2014 1730   BILITOT 0.22 10/23/2014 1453       Lab Results  Component Value Date   WBC 4.8 07/23/2015   HGB 11.9 (L) 07/23/2015   HCT 36.6 07/23/2015   MCV 90.8 07/23/2015   PLT 291 07/23/2015   NEUTROABS 3.0 11/03/2014    ASSESSMENT & PLAN:  Breast cancer of lower-outer quadrant of right female breast Right breast invasive lobular cancer 5.5 cm multifocal disease with 6/12 lymph nodes positive T3, N2, M0 stage IIIa, ER 92%, PR 98%, HER-2 negative ratio 1.13, Ki-67 10% status post bilateral mastectomies on  04/17/2014, status post adjuvant chemotherapy started 06/12/2014 and completed 10/23/2014, started radiation June 2016 completed August 2016. Started anastrozole 02/02/2015 5 years  Anastrozole toxicities: Denies any hot flashes or myalgias. Tolerating it extremely well. She does have some muscle stiffness and achiness in her hands but it gets better with activity.  Breast reconstruction with latissimus dorsi flap done in December 2016 and February 2017.  Patient drinks lots of milk and is quite often outdoors. So we elected to not have her take calcium and vit D. Bone density 03/24/2015 (Dr.Grewall) T score -0.8 normal Patient is currently working for a [employer] (for women with cancers)  RTC one year   I spent 25 minutes talking to the patient of which more than half was spent in counseling and coordination of care.  No orders of the defined types were placed in this encounter.  The patient has a good understanding of the overall plan. she agrees  with it. she will call with any problems that may develop before the next visit here.   Rulon Eisenmenger, MD 10/24/16

## 2016-10-24 NOTE — Assessment & Plan Note (Signed)
Right breast invasive lobular cancer 5.5 cm multifocal disease with 6/12 lymph nodes positive T3, N2, M0 stage IIIa, ER 92%, PR 98%, HER-2 negative ratio 1.13, Ki-67 10% status post bilateral mastectomies on 04/17/2014, status post adjuvant chemotherapy started 06/12/2014 and completed 10/23/2014, started radiation June 2016 completed August 2016. Started anastrozole 02/02/2015 5 years  Anastrozole toxicities: Denies any hot flashes or myalgias. Tolerating it extremely well.  Breast reconstruction with latissimus dorsi flap done in December 2016 and February 2017.  Patient drinks lots of milk and is quite often outdoors. So we elected to not have her take calcium and vit D. Bone density 03/24/2015 (Dr.Grewall) T score -0.8 normal  RTC one year

## 2016-11-01 ENCOUNTER — Other Ambulatory Visit: Payer: Self-pay

## 2016-11-01 DIAGNOSIS — Z170 Estrogen receptor positive status [ER+]: Principal | ICD-10-CM

## 2016-11-01 DIAGNOSIS — C50511 Malignant neoplasm of lower-outer quadrant of right female breast: Secondary | ICD-10-CM

## 2016-11-01 NOTE — Progress Notes (Signed)
Pt calling to report that she started having some increased swelling over the weekend on her R side of breast, armpit and upper arm area. Pt states that she had gone swimming and had worn a tight bathing suit. She has since been having tenderness and mild pain. Wanted to know if she can have a referral at the lymphedema clinic to get her arm evaluated. Will send in referral for evaluation.

## 2016-11-07 ENCOUNTER — Ambulatory Visit: Payer: Medicaid Other | Admitting: Sports Medicine

## 2016-11-11 ENCOUNTER — Ambulatory Visit: Payer: Medicaid Other | Attending: Hematology and Oncology | Admitting: Physical Therapy

## 2016-11-11 ENCOUNTER — Encounter: Payer: Self-pay | Admitting: Physical Therapy

## 2016-11-11 DIAGNOSIS — G8929 Other chronic pain: Secondary | ICD-10-CM | POA: Diagnosis present

## 2016-11-11 DIAGNOSIS — M25611 Stiffness of right shoulder, not elsewhere classified: Secondary | ICD-10-CM | POA: Insufficient documentation

## 2016-11-11 DIAGNOSIS — I890 Lymphedema, not elsewhere classified: Secondary | ICD-10-CM | POA: Diagnosis present

## 2016-11-11 DIAGNOSIS — M25511 Pain in right shoulder: Secondary | ICD-10-CM | POA: Diagnosis present

## 2016-11-11 DIAGNOSIS — M6281 Muscle weakness (generalized): Secondary | ICD-10-CM | POA: Insufficient documentation

## 2016-11-11 NOTE — Therapy (Signed)
[place], [place], [address] Phone: [phone]   Fax:  [phone]  Physical Therapy Evaluation  Patient Details  Name: Olivia Acosta MRN: [id number] Date of Birth: March 23, 1961 Referring Provider: Lindi Adie  Encounter Date: 11/11/2016      PT End of Session - 11/11/16 1201    Visit Number 1   Number of Visits 9   Date for PT Re-Evaluation 12/09/16   PT Start Time 0934   PT Stop Time 1020   PT Time Calculation (min) 46 min   Activity Tolerance Patient tolerated treatment well   Behavior During Therapy [facility] for tasks assessed/performed      Past Medical History:  Diagnosis Date  . Arthritis    fingers stiff  . Breast cancer ([place])    R breast- 2015  . Bronchitis 2016   treated /w IV antibiotics, admitted for 2 nights [facility]  . GERD (gastroesophageal reflux disease)   . Neuropathy    both feet since chemotherapy    Past Surgical History:  Procedure Laterality Date  . BREAST RECONSTRUCTION Bilateral 04/17/2014  . BREAST RECONSTRUCTION Right 07/23/2015   Procedure: RIGHT CHEST WOUND DEBRIDEMENT, RIGHT BREAST RECONSTRUCTION WITH SILICONE GEL IMPLANT;  Surgeon: Crissie Reese, MD;  Location: [place];  Service: Plastics;  Laterality: Right;  . BREAST RECONSTRUCTION WITH PLACEMENT OF TISSUE EXPANDER AND FLEX HD (ACELLULAR HYDRATED DERMIS) Bilateral 04/17/2014   Procedure: BILATERAL BREAST RECONSTRUCTION WITH PLACEMENT OF TISSUE EXPANDER AND FLEX HD (ACELLULAR HYDRATED DERMIS);  Surgeon: Crissie Reese, MD;  Location: [place];  Service: Plastics;  Laterality: Bilateral;  . COMPLETE MASTECTOMY W/ SENTINEL NODE BIOPSY Bilateral 04/17/2014   NIPPLE SPARING RECONSTRUCTION  . LATISSIMUS FLAP TO BREAST Right 07/23/2015   Procedure: LATISSIMUS FLAP TO RIGHT CHEST;  Surgeon: Crissie Reese, MD;  Location: [place];  Service: Plastics;  Laterality: Right;  . MASTECTOMY W/ SENTINEL NODE BIOPSY Bilateral 04/17/2014   Procedure:  BILATERAL NIPPLE SPARING MASTECTOMY WITH SENTINEL LYMPH NODE BIOPSY;  Surgeon: Excell Seltzer, MD;  Location: [place];  Service: General;  Laterality: Bilateral;  . PORT A CATH REVISION Right 05/27/2014   Procedure: PORT A CATH REVISION;  Surgeon: Excell Seltzer, MD;  Location: [place];  Service: General;  Laterality: Right;  . PORT-A-CATH REMOVAL Right 06/04/2015   Procedure: REMOVAL PORT-A-CATH;  Surgeon: Crissie Reese, MD;  Location: [place];  Service: Plastics;  Laterality: Right;  . PORTACATH PLACEMENT Left 05/27/2014   Procedure: INSERTION PORT-A-CATH;  Surgeon: Excell Seltzer, MD;  Location: [place];  Service: General;  Laterality: Left;  . REMOVAL OF TISSUE EXPANDER AND PLACEMENT OF IMPLANT Bilateral 06/04/2015   Procedure: REMOVAL OF BILATERAL TISSUE EXPANDER AND DELAYED BREAST RECONSTRUCTION PLACEMENT OF IMPLANTS;  Surgeon: Crissie Reese, MD;  Location: [place];  Service: Plastics;  Laterality: Bilateral;  . SALIVARY STONE REMOVAL  2009  . stricture of the uretha  1975  . TONSILLECTOMY    . TONSILLECTOMY AND ADENOIDECTOMY  1974  . URETHRAL STRICTURE DILATATION      There were no vitals filed for this visit.       Subjective Assessment - 11/11/16 0937    Subjective I had an appointment with Dr Lindi Adie on 21 of May. Pt went to the beach and was wearing a swimsuit over three days for several hours and had pain due to the fact it is more constricting. Most recently I have noticed swelling and pain in my axilla, side and upper arm. I have  been having these cramps in my back where my muscle was cut into. I have been going to yoga but if I turn fast I get this cramp in my back.    Pertinent History double mastectomy 04/2014, chemo started Jan 2016, radiation June 2016, reconstructive surgery 05/2015, the tissue gave way and did not heal on R side so pt had lat dorsi flap procedure in 07/2015   Patient Stated Goals increase R shoulder ROM, get in shape,  get swelling under control   Currently in Pain? No/denies   Pain Score 0-No pain            [facility] PT Assessment - 11/11/16 0001      Assessment   Medical Diagnosis right breast cancer   Referring Provider G[place]   Onset Date/Surgical Date 07/23/15   Hand Dominance Right   Prior Therapy PT in 2017 for ROM R shoulder     Precautions   Precautions Other (comment)  lymphedema RUE   Precaution Comments --     Restrictions   Weight Bearing Restrictions No     Balance Screen   Has the patient fallen in the past 6 months Yes   How many times? 1  pt's dress got hung on her high heels   Has the patient had a decrease in activity level because of a fear of falling?  No   Is the patient reluctant to leave their home because of a fear of falling?  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 reports swelling at right flank and upper arm     Posture/Postural Control   Posture/Postural Control Postural limitations   Postural Limitations Rounded Shoulders;Forward head  slight     ROM / Strength   AROM / PROM / Strength AROM     AROM   Right Shoulder Extension --   Right Shoulder Flexion 139 Degrees  in sitting   Right Shoulder ABduction 168 Degrees   Right Shoulder Internal Rotation 32 Degrees  WFL, in supine   Right Shoulder External  Rotation 80 Degrees   Left Shoulder Extension --   Left Shoulder Flexion 165 Degrees   Left Shoulder ABduction 168 Degrees   Left Shoulder Internal Rotation 45 Degrees  WFL, in supine   Left Shoulder External Rotation 76 Degrees     PROM   Overall PROM Comments --     Strength   Overall Strength Deficits  bilateral shoulders 3+/5   Overall Strength Comments --           LYMPHEDEMA/ONCOLOGY QUESTIONNAIRE - 11/11/16 1005      Type   Cancer Type right breast cancer with bilateral mastectomies     Surgeries  Mastectomy Date 04/17/14   Other Surgery Date 07/23/15  revision in February;1st reconstruction 06/04/15   Number Lymph Nodes Removed 2  but 10 negative     Treatment   Past Chemotherapy Treatment Yes   Date 10/23/14   Past Radiation Treatment Yes   Date 01/02/15     What other symptoms do you have   Are you Having Heaviness or Tightness Yes   Are you having Pain Yes   Are you having pitting edema No   Is it Hard or Difficult finding clothes that fit Yes   Do you have infections No   Is there Decreased scar mobility Yes     Lymphedema Assessments   Lymphedema Assessments Upper extremities     Right Upper Extremity Lymphedema   15 cm Proximal to Olecranon Process 30.5 cm   10 cm Proximal to Olecranon Process 29.5 cm   Olecranon Process 26.5 cm   15 cm Proximal to Ulnar Styloid Process 23.7 cm   10 cm Proximal to Ulnar Styloid Process 20.4 cm   Just Proximal to Ulnar Styloid Process 15.5 cm   Across Hand at [employer] 19.1 cm   At [place] of 2nd Digit 6.4 cm     Left Upper Extremity Lymphedema   15 cm Proximal to Olecranon Process 29.2 cm   10 cm Proximal to Olecranon Process 27.5 cm   Olecranon Process 25.7 cm   15 cm Proximal to Ulnar Styloid Process 22.6 cm   10 cm Proximal to Ulnar Styloid Process 19.7 cm   Just Proximal to Ulnar Styloid Process 15.1 cm   Across Hand at [employer] 18.5 cm   At [place] of 2nd Digit 6.4 cm            Quick Dash - 11/11/16 0001    Open a tight or new jar Moderate difficulty   Do heavy household chores (wash walls, wash floors) Mild difficulty   Carry a shopping bag or briefcase No difficulty   Wash your back No difficulty   Use a knife to cut food No difficulty   Recreational activities in which you take some force or impact through your arm, shoulder, or hand (golf, hammering, tennis) Moderate difficulty   During the past week, to what extent has your arm, shoulder or hand problem interfered with your normal social activities with family, friends, neighbors, or groups? Slightly   During the past week, to what extent has your arm, shoulder or hand problem limited your work or other regular daily activities Slightly   Arm, shoulder, or hand pain. Moderate   Tingling (pins and needles) in your arm, shoulder, or hand Moderate   Difficulty Sleeping Mild difficulty   DASH Score 27.27 %      Objective measurements completed on examination: See above findings.                     Short Term Clinic Goals - 05/09/14 1327      CC Short Term Goal  #1   Title short term goals= long term goals             Long Term Clinic Goals - 11/11/16 1213      CC Long Term Goal  #1   Title Patient to be able to perform self drainage independently for long term management of edema   Time 5   Period Weeks   Status New     CC Long Term Goal  #2  Title Pt will be able to independently verbalize lymphedema risk reduction practices   Time 5   Period Weeks   Status New     CC Long Term Goal  #3   Title Patient will be independent in a home exercise program for shoulder range of motion and strengthening   Time 5   Period Weeks   Status New     CC Long Term Goal  #4   Title Pt will demonstrate 160 degrees of right shoulder flexion to allow her to reach items overhead   Baseline 139   Time 5   Period Weeks   Status New     CC Long Term Goal  #5   Title Pt will receive  appropriate compression garments for management of right upper extremity and flank lymphedema   Time 5   Period Weeks   Status New             Plan - 11/11/16 1202    Clinical Impression Statement Patient presents to PT s/p bilateral mastectomies, chemo, radiation and reconstruction for right breast cancer including revision needed for right reconstruction with lat flap performed on 07/23/15. She returns with complaints of swelling in right flank and right upper arm. She still has decreased right shoulder ROM and decreased strength in bilateral shoulders. She reports she has been having back spasms when turing quickly and this area is tight on palaption - located where lat flap was performed. Pt would benefit from skilled PT services to decrease swelling, improve right shoulder ROM, bilateral shoulder strength and decrease back pain.    History and Personal Factors relevant to plan of care: pt is right handed   Clinical Presentation Stable   Clinical Presentation due to: pt has completed all treatments   Clinical Decision Making Low   Rehab Potential Excellent   Clinical Impairments Affecting Rehab Potential hx of radiation   PT Frequency 2x / week   PT Duration --  5   PT Treatment/Interventions ADLs/Self Care Home Management;DME Instruction;Therapeutic exercise;Patient/family education;Manual techniques;Manual lymph drainage;Passive range of motion;Orthotic Fit/Training;Compression bandaging;Scar mobilization;Taping   PT Next Visit Plan begin instruction in right flank MLD, begin P/AA/AROM activities for right shoulder, supine scap exercises- lymphedema risk reduction, begin process of sleeve   Consulted and Agree with Plan of Care Patient      Patient will benefit from skilled therapeutic intervention in order to improve the following deficits and impairments:  Decreased range of motion, Decreased strength, Impaired UE functional use, Pain, Increased fascial restricitons, Decreased  knowledge of use of DME, Decreased scar mobility, Postural dysfunction, Increased edema  Visit Diagnosis: Lymphedema, not elsewhere classified - Plan: PT plan of care cert/re-cert  Stiffness of right shoulder, not elsewhere classified - Plan: PT plan of care cert/re-cert  Chronic right shoulder pain - Plan: PT plan of care cert/re-cert  Muscle weakness (generalized) - Plan: PT plan of care cert/re-cert     Problem List Patient Active Problem List   Diagnosis Date Noted  . Left foot pain 09/19/2016  . Breast cancer in female [facility]) 07/23/2015  . Breast cancer, right breast ([place]) 06/04/2015  . Fever 09/14/2014  . Leukopenia 09/14/2014  . Flu-like symptoms 09/14/2014  . Acute bronchitis 09/14/2014  . Breast cancer of lower-outer quadrant of right female breast ([place]) 04/17/2014  . DEPRESSION 08/03/2007  . SWELLING MASS OR LUMP IN HEAD AND NECK 08/03/2007    Allyson Sabal [facility] 11/11/2016, 12:28 PM  [address]  [place], [place], [address] Phone: [phone]   Fax:  [phone]  Name: Olivia Acosta MRN: [id number] Date of Birth: [birth year]  Manus Gunning, PT 11/11/16 12:29 PM

## 2016-11-14 ENCOUNTER — Ambulatory Visit (INDEPENDENT_AMBULATORY_CARE_PROVIDER_SITE_OTHER): Payer: Medicaid Other | Admitting: Sports Medicine

## 2016-11-14 ENCOUNTER — Encounter: Payer: Self-pay | Admitting: Sports Medicine

## 2016-11-14 VITALS — BP 128/76 | Ht 66.5 in | Wt 175.0 lb

## 2016-11-14 DIAGNOSIS — M79672 Pain in left foot: Secondary | ICD-10-CM

## 2016-11-14 NOTE — Progress Notes (Signed)
  Patient comes in today for custom orthotics. Her green sports insoles and metatarsal pads have been comfortable.  Custom orthotics were created for her today. She found him to be comfortable prior to leaving the office. Total time of 30 minutes was spent with the patient with greater than 50% of the time spent in face-to-face consultation discussing orthotic construction, instruction, and fitting. Patient may continue with activity as tolerated and will follow-up with me as needed.  Patient was fitted for a : standard, cushioned, semi-rigid orthotic. The orthotic was heated and afterward the patient stood on the orthotic blank positioned on the orthotic stand. The patient was positioned in subtalar neutral position and 10 degrees of ankle dorsiflexion in a weight bearing stance. After completion of molding, a stable base was applied to the orthotic blank. The blank was ground to a stable position for weight bearing. Size: 10 Base: Blue EVA Posting: none Additional orthotic padding: none

## 2016-11-23 ENCOUNTER — Ambulatory Visit: Payer: Medicaid Other | Admitting: Physical Therapy

## 2016-11-23 DIAGNOSIS — I890 Lymphedema, not elsewhere classified: Secondary | ICD-10-CM

## 2016-11-23 NOTE — Therapy (Signed)
[place], [place], [address] Phone: [phone]   Fax:  [phone]  Physical Therapy Treatment  Patient Details  Name: Olivia Acosta MRN: [id number] Date of Birth: 1961-01-30 Referring Provider: Lindi Adie  Encounter Date: 11/23/2016      PT End of Session - 11/23/16 1529    Visit Number 2   Number of Visits 9   Date for PT Re-Evaluation 12/09/16   PT Start Time 6759   PT Stop Time 1516   PT Time Calculation (min) 38 min   Activity Tolerance Patient tolerated treatment well   Behavior During Therapy [facility] for tasks assessed/performed      Past Medical History:  Diagnosis Date  . Arthritis    fingers stiff  . Breast cancer ([place])    R breast- 2015  . Bronchitis 2016   treated /w IV antibiotics, admitted for 2 nights [facility]  . GERD (gastroesophageal reflux disease)   . Neuropathy    both feet since chemotherapy    Past Surgical History:  Procedure Laterality Date  . BREAST RECONSTRUCTION Bilateral 04/17/2014  . BREAST RECONSTRUCTION Right 07/23/2015   Procedure: RIGHT CHEST WOUND DEBRIDEMENT, RIGHT BREAST RECONSTRUCTION WITH SILICONE GEL IMPLANT;  Surgeon: Crissie Reese, MD;  Location: [place];  Service: Plastics;  Laterality: Right;  . BREAST RECONSTRUCTION WITH PLACEMENT OF TISSUE EXPANDER AND FLEX HD (ACELLULAR HYDRATED DERMIS) Bilateral 04/17/2014   Procedure: BILATERAL BREAST RECONSTRUCTION WITH PLACEMENT OF TISSUE EXPANDER AND FLEX HD (ACELLULAR HYDRATED DERMIS);  Surgeon: Crissie Reese, MD;  Location: [place];  Service: Plastics;  Laterality: Bilateral;  . COMPLETE MASTECTOMY W/ SENTINEL NODE BIOPSY Bilateral 04/17/2014   NIPPLE SPARING RECONSTRUCTION  . LATISSIMUS FLAP TO BREAST Right 07/23/2015   Procedure: LATISSIMUS FLAP TO RIGHT CHEST;  Surgeon: Crissie Reese, MD;  Location: [place];  Service: Plastics;  Laterality: Right;  . MASTECTOMY W/ SENTINEL NODE BIOPSY Bilateral 04/17/2014   Procedure:  BILATERAL NIPPLE SPARING MASTECTOMY WITH SENTINEL LYMPH NODE BIOPSY;  Surgeon: Excell Seltzer, MD;  Location: [place];  Service: General;  Laterality: Bilateral;  . PORT A CATH REVISION Right 05/27/2014   Procedure: PORT A CATH REVISION;  Surgeon: Excell Seltzer, MD;  Location: [place];  Service: General;  Laterality: Right;  . PORT-A-CATH REMOVAL Right 06/04/2015   Procedure: REMOVAL PORT-A-CATH;  Surgeon: Crissie Reese, MD;  Location: [place];  Service: Plastics;  Laterality: Right;  . PORTACATH PLACEMENT Left 05/27/2014   Procedure: INSERTION PORT-A-CATH;  Surgeon: Excell Seltzer, MD;  Location: [place];  Service: General;  Laterality: Left;  . REMOVAL OF TISSUE EXPANDER AND PLACEMENT OF IMPLANT Bilateral 06/04/2015   Procedure: REMOVAL OF BILATERAL TISSUE EXPANDER AND DELAYED BREAST RECONSTRUCTION PLACEMENT OF IMPLANTS;  Surgeon: Crissie Reese, MD;  Location: [place];  Service: Plastics;  Laterality: Bilateral;  . SALIVARY STONE REMOVAL  2009  . stricture of the uretha  1975  . TONSILLECTOMY    . TONSILLECTOMY AND ADENOIDECTOMY  1974  . URETHRAL STRICTURE DILATATION      There were no vitals filed for this visit.      Subjective Assessment - 11/23/16 1442    Subjective Having muscle spasms at right back; does stretches but thinks she may need to do them more often.   Currently in Pain? No/denies                         [facility] Adult PT Treatment/Exercise - 11/23/16 0001  Manual Therapy   Manual Therapy Manual Lymphatic Drainage (MLD)   Manual Lymphatic Drainage (MLD) Explained principles of manual lymph drainage to patient, then did the following:  short neck, superficial and deep abdomen, left axilla and anterior interaxillary pathway, right groin and axillo-inguinal pathway, and right UE from dorsal hand to shoulder, all in supine; then in left sidelying, right axillo-inguinal anastomosis and posterior interaxillary  anastomosis.                PT Education - 11/23/16 1528    Education provided Yes   Education Details began educating about manual lymph drainage   Person(s) Educated Patient   Methods Explanation;Demonstration   Comprehension Need further instruction                [place] [facility] Goals - 11/11/16 1213      CC Long Term Goal  #1   Title Patient to be able to perform self drainage independently for long term management of edema   Time 5   Period Weeks   Status New     CC Long Term Goal  #2   Title Pt will be able to independently verbalize lymphedema risk reduction practices   Time 5   Period Weeks   Status New     CC Long Term Goal  #3   Title Patient will be independent in a home exercise program for shoulder range of motion and strengthening   Time 5   Period Weeks   Status New     CC Long Term Goal  #4   Title Pt will demonstrate 160 degrees of right shoulder flexion to allow her to reach items overhead   Baseline 139   Time 5   Period Weeks   Status New     CC Long Term Goal  #5   Title Pt will receive appropriate compression garments for management of right upper extremity and flank lymphedema   Time 5   Period Weeks   Status New            Plan - 11/23/16 1529    Clinical Impression Statement Patient was attentive to instruction about manual lymph drainage and to therapist's performing this today.   Rehab Potential Excellent   Clinical Impairments Affecting Rehab Potential hx of radiation   PT Frequency 2x / week   PT Duration --  5   PT Treatment/Interventions ADLs/Self Care Home Management;DME Instruction;Therapeutic exercise;Patient/family education;Manual techniques;Manual lymph drainage;Passive range of motion;Orthotic Fit/Training;Compression bandaging;Scar mobilization;Taping   PT Next Visit Plan Have patient perform manual lymph drainage with instruction and provide HEP handout.  Go over more stretches she could use at home  for areas of discomfort at right lower axilla and right back. Later, P/AA/A/ROM for right shoulder and supine scapular exercises, lymphedema risk reduction, obtaining sleeve   PT Home Exercise Plan [place] exercises.      Patient will benefit from skilled therapeutic intervention in order to improve the following deficits and impairments:  Decreased range of motion, Decreased strength, Impaired UE functional use, Pain, Increased fascial restricitons, Decreased knowledge of use of DME, Decreased scar mobility, Postural dysfunction, Increased edema  Visit Diagnosis: Lymphedema, not elsewhere classified     Problem List Patient Active Problem List   Diagnosis Date Noted  . Left foot pain 09/19/2016  . Breast cancer in female [facility]) 07/23/2015  . Breast cancer, right breast ([place]) 06/04/2015  . Fever 09/14/2014  . Leukopenia 09/14/2014  . Flu-like symptoms 09/14/2014  .  Acute bronchitis 09/14/2014  . Breast cancer of lower-outer quadrant of right female breast ([place]) 04/17/2014  . DEPRESSION 08/03/2007  . SWELLING MASS OR LUMP IN HEAD AND NECK 08/03/2007    Mishon Blubaugh 11/23/2016, 3:32 PM  [place], [place], [address] Phone: [phone]   Fax:  [phone]  Name: Olivia Acosta MRN: [id number] Date of Birth: 02-04-1961  Serafina Royals, PT 11/23/16 3:32 PM

## 2016-11-25 ENCOUNTER — Ambulatory Visit: Payer: Medicaid Other | Admitting: Physical Therapy

## 2016-11-25 DIAGNOSIS — M25611 Stiffness of right shoulder, not elsewhere classified: Secondary | ICD-10-CM

## 2016-11-25 DIAGNOSIS — M25511 Pain in right shoulder: Secondary | ICD-10-CM

## 2016-11-25 DIAGNOSIS — G8929 Other chronic pain: Secondary | ICD-10-CM

## 2016-11-25 DIAGNOSIS — I890 Lymphedema, not elsewhere classified: Secondary | ICD-10-CM

## 2016-11-25 NOTE — Therapy (Signed)
[place], [place], [address] Phone: [phone]   Fax:  [phone]  Physical Therapy Treatment  Patient Details  Name: Olivia Acosta MRN: [id number] Date of Birth: 03-Apr-1961 Referring Provider: Lindi Adie  Encounter Date: 11/25/2016      PT End of Session - 11/25/16 1225    Visit Number 3   Number of Visits 9   Date for PT Re-Evaluation 12/09/16   PT Start Time 0801   PT Stop Time 0847   PT Time Calculation (min) 46 min   Activity Tolerance Patient tolerated treatment well   Behavior During Therapy [facility] for tasks assessed/performed      Past Medical History:  Diagnosis Date  . Arthritis    fingers stiff  . Breast cancer ([place])    R breast- 2015  . Bronchitis 2016   treated /w IV antibiotics, admitted for 2 nights [facility]  . GERD (gastroesophageal reflux disease)   . Neuropathy    both feet since chemotherapy    Past Surgical History:  Procedure Laterality Date  . BREAST RECONSTRUCTION Bilateral 04/17/2014  . BREAST RECONSTRUCTION Right 07/23/2015   Procedure: RIGHT CHEST WOUND DEBRIDEMENT, RIGHT BREAST RECONSTRUCTION WITH SILICONE GEL IMPLANT;  Surgeon: Crissie Reese, MD;  Location: [place];  Service: Plastics;  Laterality: Right;  . BREAST RECONSTRUCTION WITH PLACEMENT OF TISSUE EXPANDER AND FLEX HD (ACELLULAR HYDRATED DERMIS) Bilateral 04/17/2014   Procedure: BILATERAL BREAST RECONSTRUCTION WITH PLACEMENT OF TISSUE EXPANDER AND FLEX HD (ACELLULAR HYDRATED DERMIS);  Surgeon: Crissie Reese, MD;  Location: [place];  Service: Plastics;  Laterality: Bilateral;  . COMPLETE MASTECTOMY W/ SENTINEL NODE BIOPSY Bilateral 04/17/2014   NIPPLE SPARING RECONSTRUCTION  . LATISSIMUS FLAP TO BREAST Right 07/23/2015   Procedure: LATISSIMUS FLAP TO RIGHT CHEST;  Surgeon: Crissie Reese, MD;  Location: [place];  Service: Plastics;  Laterality: Right;  . MASTECTOMY W/ SENTINEL NODE BIOPSY Bilateral 04/17/2014   Procedure:  BILATERAL NIPPLE SPARING MASTECTOMY WITH SENTINEL LYMPH NODE BIOPSY;  Surgeon: Excell Seltzer, MD;  Location: [place];  Service: General;  Laterality: Bilateral;  . PORT A CATH REVISION Right 05/27/2014   Procedure: PORT A CATH REVISION;  Surgeon: Excell Seltzer, MD;  Location: [place];  Service: General;  Laterality: Right;  . PORT-A-CATH REMOVAL Right 06/04/2015   Procedure: REMOVAL PORT-A-CATH;  Surgeon: Crissie Reese, MD;  Location: [place];  Service: Plastics;  Laterality: Right;  . PORTACATH PLACEMENT Left 05/27/2014   Procedure: INSERTION PORT-A-CATH;  Surgeon: Excell Seltzer, MD;  Location: [place];  Service: General;  Laterality: Left;  . REMOVAL OF TISSUE EXPANDER AND PLACEMENT OF IMPLANT Bilateral 06/04/2015   Procedure: REMOVAL OF BILATERAL TISSUE EXPANDER AND DELAYED BREAST RECONSTRUCTION PLACEMENT OF IMPLANTS;  Surgeon: Crissie Reese, MD;  Location: [place];  Service: Plastics;  Laterality: Bilateral;  . SALIVARY STONE REMOVAL  2009  . stricture of the uretha  1975  . TONSILLECTOMY    . TONSILLECTOMY AND ADENOIDECTOMY  1974  . URETHRAL STRICTURE DILATATION      There were no vitals filed for this visit.      Subjective Assessment - 11/25/16 0804    Subjective "I have some tenderness right here (right flank) and I noticed it yesterday.   Currently in Pain? Yes   Pain Score 2    Pain Location Flank   Pain Orientation Right   Pain Descriptors / Indicators Tender   Aggravating Factors  wearing Koobie bra?  [facility] Adult PT Treatment/Exercise - 11/25/16 0001      Manual Therapy   Manual Lymphatic Drainage (MLD) Educated patient about and had her perform the following:  short neck, superficial and deep abdomen, left axilla and anterior interaxillary pathway, right groin and axillo-inguinal pathway, and right UE from dorsal hand to shoulder, all in supine.                PT Education -  11/25/16 1225    Education provided Yes   Education Details self-manual lymph drainage   Person(s) Educated Patient   Methods Explanation;Tactile cues;Verbal cues;Handout   Comprehension Verbalized understanding;Returned demonstration                [facility] Goals - 11/11/16 1213      CC Long Term Goal  #1   Title Patient to be able to perform self drainage independently for long term management of edema   Time 5   Period Weeks   Status New     CC Long Term Goal  #2   Title Pt will be able to independently verbalize lymphedema risk reduction practices   Time 5   Period Weeks   Status New     CC Long Term Goal  #3   Title Patient will be independent in a home exercise program for shoulder range of motion and strengthening   Time 5   Period Weeks   Status New     CC Long Term Goal  #4   Title Pt will demonstrate 160 degrees of right shoulder flexion to allow her to reach items overhead   Baseline 139   Time 5   Period Weeks   Status New     CC Long Term Goal  #5   Title Pt will receive appropriate compression garments for management of right upper extremity and flank lymphedema   Time 5   Period Weeks   Status New            Plan - 11/25/16 1225    Clinical Impression Statement Pt. did well with initial performance of self-manual lymph drainage wit instruction today.  She c/o tenderness at right flank; if time, will do manual work to this area and/or teach stretches for it.   Rehab Potential Excellent   Clinical Impairments Affecting Rehab Potential hx of radiation   PT Frequency 2x / week   PT Duration --  5   PT Treatment/Interventions ADLs/Self Care Home Management;DME Instruction;Therapeutic exercise;Patient/family education;Manual techniques;Manual lymph drainage;Passive range of motion;Orthotic Fit/Training;Compression bandaging;Scar mobilization;Taping   PT Next Visit Plan Review self-manual lymph drainage. Go over more stretches she could  use at home for areas of discomfort at right lower axilla and right back. Consider soft tissue mobilization for area of discomfort right flank. Later, P/AA/A/ROM for right shoulder and supine scapular exercises, lymphedema risk reduction, obtaining sleeve   Consulted and Agree with Plan of Care Patient      Patient will benefit from skilled therapeutic intervention in order to improve the following deficits and impairments:  Decreased range of motion, Decreased strength, Impaired UE functional use, Pain, Increased fascial restricitons, Decreased knowledge of use of DME, Decreased scar mobility, Postural dysfunction, Increased edema  Visit Diagnosis: Lymphedema, not elsewhere classified  Stiffness of right shoulder, not elsewhere classified  Chronic right shoulder pain     Problem List Patient Active Problem List   Diagnosis Date Noted  . Left foot pain 09/19/2016  . Breast cancer in female [facility])  07/23/2015  . Breast cancer, right breast ([place]) 06/04/2015  . Fever 09/14/2014  . Leukopenia 09/14/2014  . Flu-like symptoms 09/14/2014  . Acute bronchitis 09/14/2014  . Breast cancer of lower-outer quadrant of right female breast ([place]) 04/17/2014  . DEPRESSION 08/03/2007  . SWELLING MASS OR LUMP IN HEAD AND NECK 08/03/2007    Daiquan Resnik 11/25/2016, 12:28 PM  [place], [place], [address] Phone: [phone]   Fax:  [phone]  Name: Celena Lanius MRN: [id number] Date of Birth: [birth year]  Serafina Royals, PT 11/25/16 12:28 PM

## 2016-11-25 NOTE — Patient Instructions (Signed)
Start by crossing arms and place hands beside your neck and behind your collar bones.  Do stationary circles there 5 times.  Deep Effective Breath   Standing, sitting, or laying down, place both hands on the belly. Take a deep breath IN, expanding the belly; then breath OUT, contracting the belly. Repeat __5__ times. Do __2-3__ sessions per day and before your self massage.  http://gt2.exer.us/866   Copyright  VHI. All rights reserved.  Axilla to Axilla - Pump   On uninvolved side make 5 circles in the armpit, then pump _5__ times from involved armpit across chest to uninvolved armpit, making a pathway. Do _1__ time per day.  Copyright  VHI. All rights reserved.  Axilla to Inguinal Nodes - Pump   On involved side, make 5 circles at groin at panty line, then pump _5__ times from armpit along side of trunk to outer hip, making your other pathway. Do __1_ time per day.  Copyright  VHI. All rights reserved.  Arm Posterior: Elbow to Shoulder - Pump   Pump _5__ times from back of elbow to top of shoulder. Then inner to outer upper arm _5_ times, then outer arm again _5_ times. Then back to the pathways _2-3_ times. Do _1__ time per day.  Copyright  VHI. All rights reserved.  ARM: [place] Wrist to Elbow - Pump   Pump or stationary circles _5__ times from wrist to elbow making sure to do both sides of the forearm. Then retrace your steps to the outer arm, and the pathways _2-3_ times each. Do _1__ time per day.  Copyright  VHI. All rights reserved.  ARM: Dorsum of Hand to Shoulder - Sweep   Pump or stationary circles _5__ times on back of hand including knuckle spaces and individual fingers if needed working up towards the wrist, then retrace all your steps working back up the forearm, doing both sides; upper outer arm and back to your pathways _2-3_ times each. Then do 5 circles again at uninvolved armpit and involved groin where you started! Good job!! Do __1_ time per  day.  Copyright  VHI. All rights reserved.

## 2016-11-30 ENCOUNTER — Ambulatory Visit: Payer: Medicaid Other | Admitting: Physical Therapy

## 2016-12-02 ENCOUNTER — Ambulatory Visit: Payer: Medicaid Other | Admitting: Physical Therapy

## 2016-12-02 ENCOUNTER — Encounter: Payer: Self-pay | Admitting: Physical Therapy

## 2016-12-02 DIAGNOSIS — G8929 Other chronic pain: Secondary | ICD-10-CM

## 2016-12-02 DIAGNOSIS — M25511 Pain in right shoulder: Secondary | ICD-10-CM

## 2016-12-02 DIAGNOSIS — M25611 Stiffness of right shoulder, not elsewhere classified: Secondary | ICD-10-CM

## 2016-12-02 DIAGNOSIS — I890 Lymphedema, not elsewhere classified: Secondary | ICD-10-CM | POA: Diagnosis not present

## 2016-12-02 NOTE — Therapy (Signed)
[place], [place], [address] Phone: [phone]   Fax:  [phone]  Physical Therapy Treatment  Patient Details  Name: Olivia Acosta MRN: [id number] Date of Birth: 1960/08/04 Referring Provider: Lindi Adie  Encounter Date: 12/02/2016      PT End of Session - 12/02/16 1154    Visit Number 4   Number of Visits 9   Date for PT Re-Evaluation 12/09/16   PT Start Time 1105   PT Stop Time 1150   PT Time Calculation (min) 45 min   Activity Tolerance Patient tolerated treatment well   Behavior During Therapy [facility] for tasks assessed/performed      Past Medical History:  Diagnosis Date  . Arthritis    fingers stiff  . Breast cancer ([place])    R breast- 2015  . Bronchitis 2016   treated /w IV antibiotics, admitted for 2 nights [facility]  . GERD (gastroesophageal reflux disease)   . Neuropathy    both feet since chemotherapy    Past Surgical History:  Procedure Laterality Date  . BREAST RECONSTRUCTION Bilateral 04/17/2014  . BREAST RECONSTRUCTION Right 07/23/2015   Procedure: RIGHT CHEST WOUND DEBRIDEMENT, RIGHT BREAST RECONSTRUCTION WITH SILICONE GEL IMPLANT;  Surgeon: Crissie Reese, MD;  Location: [place];  Service: Plastics;  Laterality: Right;  . BREAST RECONSTRUCTION WITH PLACEMENT OF TISSUE EXPANDER AND FLEX HD (ACELLULAR HYDRATED DERMIS) Bilateral 04/17/2014   Procedure: BILATERAL BREAST RECONSTRUCTION WITH PLACEMENT OF TISSUE EXPANDER AND FLEX HD (ACELLULAR HYDRATED DERMIS);  Surgeon: Crissie Reese, MD;  Location: [place];  Service: Plastics;  Laterality: Bilateral;  . COMPLETE MASTECTOMY W/ SENTINEL NODE BIOPSY Bilateral 04/17/2014   NIPPLE SPARING RECONSTRUCTION  . LATISSIMUS FLAP TO BREAST Right 07/23/2015   Procedure: LATISSIMUS FLAP TO RIGHT CHEST;  Surgeon: Crissie Reese, MD;  Location: [place];  Service: Plastics;  Laterality: Right;  . MASTECTOMY W/ SENTINEL NODE BIOPSY Bilateral 04/17/2014   Procedure:  BILATERAL NIPPLE SPARING MASTECTOMY WITH SENTINEL LYMPH NODE BIOPSY;  Surgeon: Excell Seltzer, MD;  Location: [place];  Service: General;  Laterality: Bilateral;  . PORT A CATH REVISION Right 05/27/2014   Procedure: PORT A CATH REVISION;  Surgeon: Excell Seltzer, MD;  Location: [place];  Service: General;  Laterality: Right;  . PORT-A-CATH REMOVAL Right 06/04/2015   Procedure: REMOVAL PORT-A-CATH;  Surgeon: Crissie Reese, MD;  Location: [place];  Service: Plastics;  Laterality: Right;  . PORTACATH PLACEMENT Left 05/27/2014   Procedure: INSERTION PORT-A-CATH;  Surgeon: Excell Seltzer, MD;  Location: [place];  Service: General;  Laterality: Left;  . REMOVAL OF TISSUE EXPANDER AND PLACEMENT OF IMPLANT Bilateral 06/04/2015   Procedure: REMOVAL OF BILATERAL TISSUE EXPANDER AND DELAYED BREAST RECONSTRUCTION PLACEMENT OF IMPLANTS;  Surgeon: Crissie Reese, MD;  Location: [place];  Service: Plastics;  Laterality: Bilateral;  . SALIVARY STONE REMOVAL  2009  . stricture of the uretha  1975  . TONSILLECTOMY    . TONSILLECTOMY AND ADENOIDECTOMY  1974  . URETHRAL STRICTURE DILATATION      There were no vitals filed for this visit.      Subjective Assessment - 12/02/16 1109    Subjective The soreness is feeling better but I have been in the hosptial with my daughter most of the time so I have not been working as much. I have not been doing any of my exercises because I have either been at the hospital or working.    Pertinent History double mastectomy 04/2014, chemo started  Jan 2016, radiation June 2016, reconstructive surgery 05/2015, the tissue gave way and did not heal on R side so pt had lat dorsi flap procedure in 07/2015   Patient Stated Goals increase R shoulder ROM, get in shape, get swelling under control   Currently in Pain? No/denies   Pain Score 0-No pain                         [facility] Adult PT Treatment/Exercise - 12/02/16 0001       Manual Therapy   Manual Therapy Manual Lymphatic Drainage (MLD);Passive ROM   Manual Lymphatic Drainage (MLD) Educated patient throughout:  short neck, superficial and deep abdomen, left axilla and anterior interaxillary pathway, right groin and axillo-inguinal pathway, and right UE from dorsal hand to shoulder, all in supine.   Passive ROM to RUE in direction of flexion, abduction and ER                        [facility] Goals - 11/11/16 1213      CC Long Term Goal  #1   Title Patient to be able to perform self drainage independently for long term management of edema   Time 5   Period Weeks   Status New     CC Long Term Goal  #2   Title Pt will be able to independently verbalize lymphedema risk reduction practices   Time 5   Period Weeks   Status New     CC Long Term Goal  #3   Title Patient will be independent in a home exercise program for shoulder range of motion and strengthening   Time 5   Period Weeks   Status New     CC Long Term Goal  #4   Title Pt will demonstrate 160 degrees of right shoulder flexion to allow her to reach items overhead   Baseline 139   Time 5   Period Weeks   Status New     CC Long Term Goal  #5   Title Pt will receive appropriate compression garments for management of right upper extremity and flank lymphedema   Time 5   Period Weeks   Status New            Plan - 12/02/16 1154    Clinical Impression Statement Patient has had a lot going on in her personal life and has not been able to practice her exercises or self drainage over the last week. Reeducated pt in self drainage technique and did PROM to her R shoulder. ROM was greatly improved following PROM.    Rehab Potential Excellent   Clinical Impairments Affecting Rehab Potential hx of radiation   PT Frequency 2x / week   PT Treatment/Interventions ADLs/Self Care Home Management;DME Instruction;Therapeutic exercise;Patient/family education;Manual  techniques;Manual lymph drainage;Passive range of motion;Orthotic Fit/Training;Compression bandaging;Scar mobilization;Taping   PT Next Visit Plan Review self-manual lymph drainage. Go over more stretches she could use at home for areas of discomfort at right lower axilla and right back. Consider soft tissue mobilization for area of discomfort right flank. Later, P/AA/A/ROM for right shoulder and supine scapular exercises, lymphedema risk reduction, obtaining sleeve   PT Home Exercise Plan [place] exercises.   Consulted and Agree with Plan of Care Patient      Patient will benefit from skilled therapeutic intervention in order to improve the following deficits and impairments:  Decreased range of motion, Decreased strength, Impaired  UE functional use, Pain, Increased fascial restricitons, Decreased knowledge of use of DME, Decreased scar mobility, Postural dysfunction, Increased edema  Visit Diagnosis: Lymphedema, not elsewhere classified  Stiffness of right shoulder, not elsewhere classified  Chronic right shoulder pain     Problem List Patient Active Problem List   Diagnosis Date Noted  . Left foot pain 09/19/2016  . Breast cancer in female [facility]) 07/23/2015  . Breast cancer, right breast ([place]) 06/04/2015  . Fever 09/14/2014  . Leukopenia 09/14/2014  . Flu-like symptoms 09/14/2014  . Acute bronchitis 09/14/2014  . Breast cancer of lower-outer quadrant of right female breast ([place]) 04/17/2014  . DEPRESSION 08/03/2007  . SWELLING MASS OR LUMP IN HEAD AND NECK 08/03/2007    Allyson Sabal [facility] 12/02/2016, 11:56 AM  [place], [place], [address] Phone: [phone]   Fax:  [phone]  Name: Aerica Rincon MRN: [id number] Date of Birth: 12/28/1960  Manus Gunning, PT 12/02/16 11:56 AM

## 2016-12-09 ENCOUNTER — Ambulatory Visit: Payer: BLUE CROSS/BLUE SHIELD | Attending: Hematology and Oncology | Admitting: Physical Therapy

## 2016-12-09 ENCOUNTER — Encounter: Payer: Self-pay | Admitting: Physical Therapy

## 2016-12-09 DIAGNOSIS — M25511 Pain in right shoulder: Secondary | ICD-10-CM | POA: Diagnosis present

## 2016-12-09 DIAGNOSIS — I890 Lymphedema, not elsewhere classified: Secondary | ICD-10-CM

## 2016-12-09 DIAGNOSIS — M6281 Muscle weakness (generalized): Secondary | ICD-10-CM | POA: Diagnosis present

## 2016-12-09 DIAGNOSIS — G8929 Other chronic pain: Secondary | ICD-10-CM

## 2016-12-09 DIAGNOSIS — M25612 Stiffness of left shoulder, not elsewhere classified: Secondary | ICD-10-CM | POA: Diagnosis present

## 2016-12-09 DIAGNOSIS — M25611 Stiffness of right shoulder, not elsewhere classified: Secondary | ICD-10-CM

## 2016-12-09 NOTE — Therapy (Signed)
[place], [place], [address] Phone: [phone]   Fax:  [phone]  Physical Therapy Treatment  Patient Details  Name: Olivia Acosta MRN: [id number] Date of Birth: Feb 23, 1961 Referring Provider: Lindi Adie  Encounter Date: 12/09/2016      PT End of Session - 12/09/16 1158    Visit Number 5   Number of Visits 9   Date for PT Re-Evaluation 01/06/17   PT Start Time 1022   PT Stop Time 1105   PT Time Calculation (min) 43 min   Activity Tolerance Patient tolerated treatment well   Behavior During Therapy [facility] for tasks assessed/performed      Past Medical History:  Diagnosis Date  . Arthritis    fingers stiff  . Breast cancer ([place])    R breast- 2015  . Bronchitis 2016   treated /w IV antibiotics, admitted for 2 nights [facility]  . GERD (gastroesophageal reflux disease)   . Neuropathy    both feet since chemotherapy    Past Surgical History:  Procedure Laterality Date  . BREAST RECONSTRUCTION Bilateral 04/17/2014  . BREAST RECONSTRUCTION Right 07/23/2015   Procedure: RIGHT CHEST WOUND DEBRIDEMENT, RIGHT BREAST RECONSTRUCTION WITH SILICONE GEL IMPLANT;  Surgeon: Crissie Reese, MD;  Location: [place];  Service: Plastics;  Laterality: Right;  . BREAST RECONSTRUCTION WITH PLACEMENT OF TISSUE EXPANDER AND FLEX HD (ACELLULAR HYDRATED DERMIS) Bilateral 04/17/2014   Procedure: BILATERAL BREAST RECONSTRUCTION WITH PLACEMENT OF TISSUE EXPANDER AND FLEX HD (ACELLULAR HYDRATED DERMIS);  Surgeon: Crissie Reese, MD;  Location: [place];  Service: Plastics;  Laterality: Bilateral;  . COMPLETE MASTECTOMY W/ SENTINEL NODE BIOPSY Bilateral 04/17/2014   NIPPLE SPARING RECONSTRUCTION  . LATISSIMUS FLAP TO BREAST Right 07/23/2015   Procedure: LATISSIMUS FLAP TO RIGHT CHEST;  Surgeon: Crissie Reese, MD;  Location: [place];  Service: Plastics;  Laterality: Right;  . MASTECTOMY W/ SENTINEL NODE BIOPSY Bilateral 04/17/2014   Procedure:  BILATERAL NIPPLE SPARING MASTECTOMY WITH SENTINEL LYMPH NODE BIOPSY;  Surgeon: Excell Seltzer, MD;  Location: [place];  Service: General;  Laterality: Bilateral;  . PORT A CATH REVISION Right 05/27/2014   Procedure: PORT A CATH REVISION;  Surgeon: Excell Seltzer, MD;  Location: [place];  Service: General;  Laterality: Right;  . PORT-A-CATH REMOVAL Right 06/04/2015   Procedure: REMOVAL PORT-A-CATH;  Surgeon: Crissie Reese, MD;  Location: [place];  Service: Plastics;  Laterality: Right;  . PORTACATH PLACEMENT Left 05/27/2014   Procedure: INSERTION PORT-A-CATH;  Surgeon: Excell Seltzer, MD;  Location: [place];  Service: General;  Laterality: Left;  . REMOVAL OF TISSUE EXPANDER AND PLACEMENT OF IMPLANT Bilateral 06/04/2015   Procedure: REMOVAL OF BILATERAL TISSUE EXPANDER AND DELAYED BREAST RECONSTRUCTION PLACEMENT OF IMPLANTS;  Surgeon: Crissie Reese, MD;  Location: [place];  Service: Plastics;  Laterality: Bilateral;  . SALIVARY STONE REMOVAL  2009  . stricture of the uretha  1975  . TONSILLECTOMY    . TONSILLECTOMY AND ADENOIDECTOMY  1974  . URETHRAL STRICTURE DILATATION      There were no vitals filed for this visit.      Subjective Assessment - 12/09/16 1024    Subjective My soreness is pretty good. I have been working out but I have been very careful about not overworking my chest or my right side.    Pertinent History double mastectomy 04/2014, chemo started Jan 2016, radiation June 2016, reconstructive surgery 05/2015, the tissue gave way and did not heal on R side so pt  had lat dorsi flap procedure in 07/2015   Patient Stated Goals increase R shoulder ROM, get in shape, get swelling under control   Currently in Pain? No/denies   Pain Score 0-No pain                         [facility] Adult PT Treatment/Exercise - 12/09/16 0001      Shoulder Exercises: Supine   Horizontal ABduction Strengthening;Both;10 reps;Theraband   Theraband  Level (Shoulder Horizontal ABduction) Level 1 (Yellow)   External Rotation Strengthening;Both;10 reps;Theraband   Theraband Level (Shoulder External Rotation) Level 1 (Yellow)   Flexion Strengthening;Both;10 reps;Theraband  narrow and wide grip   Theraband Level (Shoulder Flexion) Level 1 (Yellow)   Other Supine Exercises D2 x 10 reps each with yellow band     Manual Therapy   Passive ROM to RUE in direction of flexion, abduction and ER                        Long Term Clinic Goals - 12/09/16 1025      CC Long Term Goal  #1   Title Patient to be able to perform self drainage independently for long term management of edema   Baseline 12/09/16- pt states she is becoming more comfortable with this   Time 5   Period Weeks   Status On-going     CC Long Term Goal  #2   Title Pt will be able to independently verbalize lymphedema risk reduction practices   Baseline pt able to independently verbalize these on 12/09/16   Time 5   Period Weeks   Status Achieved     CC Long Term Goal  #3   Title Patient will be independent in a home exercise program for shoulder range of motion and strengthening   Time 5   Period Weeks   Status On-going     CC Long Term Goal  #4   Title Pt will demonstrate 160 degrees of right shoulder flexion to allow her to reach items overhead   Baseline 139, 12/09/16- 139   Time 5   Period Weeks   Status On-going     CC Long Term Goal  #5   Title Pt will receive appropriate compression garments for management of right upper extremity and flank lymphedema   Time 5   Period Weeks   Status On-going            Plan - 12/09/16 1158    Clinical Impression Statement Recert performed today. Patient continuing to progress towards goals in therapy. Her range of motion is improving but is still limited. She was able to independently verbalize lymphedema risk reduction practices and is progressing towards independence with self manual lymphatic drainage.  Today added supine scapular exercises to pt's home exercise program. She would benefit from continued skilled PT services to futher improve ROM, decrease tightness and lateral trunk lymphedema and expand pt's home exericse program.    Rehab Potential Excellent   Clinical Impairments Affecting Rehab Potential hx of radiation   PT Frequency 2x / week   PT Duration 4 weeks   PT Treatment/Interventions ADLs/Self Care Home Management;DME Instruction;Therapeutic exercise;Patient/family education;Manual techniques;Manual lymph drainage;Passive range of motion;Orthotic Fit/Training;Compression bandaging;Scar mobilization;Taping   PT Next Visit Plan Review self-manual lymph drainage. Go over more stretches she could use at home for areas of discomfort at right lower axilla and right back. Consider soft tissue mobilization for area of  discomfort right flank, P/AA/A/ROM for right shoulder , assist with obtaining sleeve   PT Home Exercise Plan Dowel exercises, supine scap   Consulted and Agree with Plan of Care Patient      Patient will benefit from skilled therapeutic intervention in order to improve the following deficits and impairments:  Decreased range of motion, Decreased strength, Impaired UE functional use, Pain, Increased fascial restricitons, Decreased knowledge of use of DME, Decreased scar mobility, Postural dysfunction, Increased edema  Visit Diagnosis: Stiffness of right shoulder, not elsewhere classified - Plan: PT plan of care cert/re-cert  Muscle weakness (generalized) - Plan: PT plan of care cert/re-cert  Chronic right shoulder pain - Plan: PT plan of care cert/re-cert  Stiffness of left shoulder, not elsewhere classified - Plan: PT plan of care cert/re-cert  Lymphedema, not elsewhere classified - Plan: PT plan of care cert/re-cert     Problem List Patient Active Problem List   Diagnosis Date Noted  . Left foot pain 09/19/2016  . Breast cancer in female [facility]) 07/23/2015  .  Breast cancer, right breast ([place]) 06/04/2015  . Fever 09/14/2014  . Leukopenia 09/14/2014  . Flu-like symptoms 09/14/2014  . Acute bronchitis 09/14/2014  . Breast cancer of lower-outer quadrant of right female breast ([place]) 04/17/2014  . DEPRESSION 08/03/2007  . SWELLING MASS OR LUMP IN HEAD AND NECK 08/03/2007    Allyson Sabal [facility] 12/09/2016, 12:03 PM  [place], [place], [address] Phone: [phone]   Fax:  [phone]  Name: Olivia Acosta MRN: [id number] Date of Birth: 09-01-1960  Manus Gunning, PT 12/09/16 12:04 PM

## 2016-12-09 NOTE — Patient Instructions (Signed)

## 2016-12-14 ENCOUNTER — Encounter: Payer: Medicaid Other | Admitting: Physical Therapy

## 2016-12-16 ENCOUNTER — Ambulatory Visit: Payer: BLUE CROSS/BLUE SHIELD | Admitting: Physical Therapy

## 2016-12-16 ENCOUNTER — Encounter: Payer: Self-pay | Admitting: Physical Therapy

## 2016-12-16 DIAGNOSIS — M25611 Stiffness of right shoulder, not elsewhere classified: Secondary | ICD-10-CM | POA: Diagnosis not present

## 2016-12-16 DIAGNOSIS — G8929 Other chronic pain: Secondary | ICD-10-CM

## 2016-12-16 DIAGNOSIS — M6281 Muscle weakness (generalized): Secondary | ICD-10-CM

## 2016-12-16 DIAGNOSIS — M25511 Pain in right shoulder: Secondary | ICD-10-CM

## 2016-12-16 NOTE — Therapy (Signed)
[place], [place], [address] Phone: [phone]   Fax:  [phone]  Physical Therapy Treatment  Patient Details  Name: Olivia Acosta MRN: [id number] Date of Birth: Apr 18, 1961 Referring Provider: Lindi Adie  Encounter Date: 12/16/2016      PT End of Session - 12/16/16 1119    Visit Number 6   Number of Visits 9   Date for PT Re-Evaluation 01/06/17   PT Start Time 0803   PT Stop Time 0848   PT Time Calculation (min) 45 min   Activity Tolerance Patient tolerated treatment well   Behavior During Therapy [facility] for tasks assessed/performed      Past Medical History:  Diagnosis Date  . Arthritis    fingers stiff  . Breast cancer ([place])    R breast- 2015  . Bronchitis 2016   treated /w IV antibiotics, admitted for 2 nights [facility]  . GERD (gastroesophageal reflux disease)   . Neuropathy    both feet since chemotherapy    Past Surgical History:  Procedure Laterality Date  . BREAST RECONSTRUCTION Bilateral 04/17/2014  . BREAST RECONSTRUCTION Right 07/23/2015   Procedure: RIGHT CHEST WOUND DEBRIDEMENT, RIGHT BREAST RECONSTRUCTION WITH SILICONE GEL IMPLANT;  Surgeon: Crissie Reese, MD;  Location: [place];  Service: Plastics;  Laterality: Right;  . BREAST RECONSTRUCTION WITH PLACEMENT OF TISSUE EXPANDER AND FLEX HD (ACELLULAR HYDRATED DERMIS) Bilateral 04/17/2014   Procedure: BILATERAL BREAST RECONSTRUCTION WITH PLACEMENT OF TISSUE EXPANDER AND FLEX HD (ACELLULAR HYDRATED DERMIS);  Surgeon: Crissie Reese, MD;  Location: [place];  Service: Plastics;  Laterality: Bilateral;  . COMPLETE MASTECTOMY W/ SENTINEL NODE BIOPSY Bilateral 04/17/2014   NIPPLE SPARING RECONSTRUCTION  . LATISSIMUS FLAP TO BREAST Right 07/23/2015   Procedure: LATISSIMUS FLAP TO RIGHT CHEST;  Surgeon: Crissie Reese, MD;  Location: [place];  Service: Plastics;  Laterality: Right;  . MASTECTOMY W/ SENTINEL NODE BIOPSY Bilateral 04/17/2014   Procedure:  BILATERAL NIPPLE SPARING MASTECTOMY WITH SENTINEL LYMPH NODE BIOPSY;  Surgeon: Excell Seltzer, MD;  Location: [place];  Service: General;  Laterality: Bilateral;  . PORT A CATH REVISION Right 05/27/2014   Procedure: PORT A CATH REVISION;  Surgeon: Excell Seltzer, MD;  Location: [place];  Service: General;  Laterality: Right;  . PORT-A-CATH REMOVAL Right 06/04/2015   Procedure: REMOVAL PORT-A-CATH;  Surgeon: Crissie Reese, MD;  Location: [place];  Service: Plastics;  Laterality: Right;  . PORTACATH PLACEMENT Left 05/27/2014   Procedure: INSERTION PORT-A-CATH;  Surgeon: Excell Seltzer, MD;  Location: [place];  Service: General;  Laterality: Left;  . REMOVAL OF TISSUE EXPANDER AND PLACEMENT OF IMPLANT Bilateral 06/04/2015   Procedure: REMOVAL OF BILATERAL TISSUE EXPANDER AND DELAYED BREAST RECONSTRUCTION PLACEMENT OF IMPLANTS;  Surgeon: Crissie Reese, MD;  Location: [place];  Service: Plastics;  Laterality: Bilateral;  . SALIVARY STONE REMOVAL  2009  . stricture of the uretha  1975  . TONSILLECTOMY    . TONSILLECTOMY AND ADENOIDECTOMY  1974  . URETHRAL STRICTURE DILATATION      There were no vitals filed for this visit.      Subjective Assessment - 12/16/16 0804    Subjective I am doing good. I have been going to [employer] every other day. 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 I have been using 1 and 5 lb weights. I have been doing lots of leg stuff.    Pertinent History double mastectomy 04/2014, chemo started Jan 2016, radiation June 2016, reconstructive surgery  05/2015, the tissue gave way and did not heal on R side so pt had lat dorsi flap procedure in 07/2015   Patient Stated Goals increase R shoulder ROM, get in shape, get swelling under control   Currently in Pain? No/denies   Pain Score 0-No pain                         [facility] Adult PT Treatment/Exercise - 12/16/16 0001      Shoulder Exercises: Prone   Retraction  Strengthening;Both;10 reps   Flexion Strengthening;Both;10 reps  [place]   Extension Strengthening;Both;10 reps     Manual Therapy   Manual Therapy Manual Lymphatic Drainage (MLD);Soft tissue mobilization   Soft tissue mobilization using biotone to area around right lat scar with pt lying in prone, this area was very tight- pt often has muscle spasms in this location especially while driving and turning to look over shoulder   Passive ROM to RUE in direction of flexion, abduction and ER                        Long Term Clinic Goals - 12/16/16 0813      CC Long Term Goal  #1   Title Patient to be able to perform self drainage independently for long term management of edema   Baseline 12/09/16- pt states she is becoming more comfortable with this, 12/16/16- pt states she feels independent with this   Time 5   Period Weeks   Status Achieved     CC Long Term Goal  #2   Title Pt will be able to independently verbalize lymphedema risk reduction practices   Baseline pt able to independently verbalize these on 12/09/16   Time 5   Period Weeks   Status Achieved     CC Long Term Goal  #4   Title Pt will demonstrate 160 degrees of right shoulder flexion to allow her to reach items overhead   Baseline 139, 12/09/16- 139, 12/16/16- 145   Time 5   Period Weeks   Status On-going     CC Long Term Goal  #5   Title Pt will receive appropriate compression garments for management of right upper extremity and flank lymphedema   Baseline 12/16/16- Sent rx to get signed   Time 5   Period Weeks   Status On-going            Plan - 12/16/16 1120    Clinical Impression Statement Patient has been performing exercises at the gym. She states she can really feel the latissimus exercises and has spasms in this area especially when reaching over her shoulder or turning while she is driving. Performed soft tissue mobilization to this area with decrease tightness noted following. Continued with  shoulder ROM since pt is still limited with shoulder flexion and abduction. She is still demonstrating swelling across posterior upper quadrant and would benefit from MLD to this area. Sent script to get signed for compression sleve and bra.    Rehab Potential Excellent   Clinical Impairments Affecting Rehab Potential hx of radiation   PT Frequency 2x / week   PT Duration 4 weeks   PT Treatment/Interventions ADLs/Self Care Home Management;DME Instruction;Therapeutic exercise;Patient/family education;Manual techniques;Manual lymph drainage;Passive range of motion;Orthotic Fit/Training;Compression bandaging;Scar mobilization;Taping   PT Next Visit Plan Review self-manual lymph drainage and perform to area of swelling in posterior upper quadrant. Go over more stretches she could use at home  for areas of discomfort at right lower axilla and right back. Consider soft tissue mobilization for area of discomfort right flank, P/AA/A/ROM for right shoulder , check for signed rx for sleeve   PT Home Exercise Plan Dowel exercises, supine scap   Recommended Other Services sent off rx for compression sleeve and bra      Patient will benefit from skilled therapeutic intervention in order to improve the following deficits and impairments:  Decreased range of motion, Decreased strength, Impaired UE functional use, Pain, Increased fascial restricitons, Decreased knowledge of use of DME, Decreased scar mobility, Postural dysfunction, Increased edema  Visit Diagnosis: Stiffness of right shoulder, not elsewhere classified  Muscle weakness (generalized)  Chronic right shoulder pain     Problem List Patient Active Problem List   Diagnosis Date Noted  . Left foot pain 09/19/2016  . Breast cancer in female [facility]) 07/23/2015  . Breast cancer, right breast ([place]) 06/04/2015  . Fever 09/14/2014  . Leukopenia 09/14/2014  . Flu-like symptoms 09/14/2014  . Acute bronchitis 09/14/2014  . Breast cancer of lower-outer  quadrant of right female breast ([place]) 04/17/2014  . DEPRESSION 08/03/2007  . SWELLING MASS OR LUMP IN HEAD AND NECK 08/03/2007    Allyson Sabal [facility] 12/16/2016, 11:23 AM  [place], [place], [address] Phone: [phone]   Fax:  [phone]  Name: Olivia Acosta MRN: [id number] Date of Birth: 06-21-1960  Manus Gunning, PT 12/16/16 11:24 AM

## 2016-12-21 ENCOUNTER — Encounter: Payer: Medicaid Other | Admitting: Physical Therapy

## 2016-12-23 ENCOUNTER — Ambulatory Visit: Payer: BLUE CROSS/BLUE SHIELD | Admitting: Physical Therapy

## 2016-12-23 DIAGNOSIS — G8929 Other chronic pain: Secondary | ICD-10-CM

## 2016-12-23 DIAGNOSIS — M25611 Stiffness of right shoulder, not elsewhere classified: Secondary | ICD-10-CM

## 2016-12-23 DIAGNOSIS — M25511 Pain in right shoulder: Secondary | ICD-10-CM

## 2016-12-23 DIAGNOSIS — M6281 Muscle weakness (generalized): Secondary | ICD-10-CM

## 2016-12-23 NOTE — Therapy (Signed)
[place], [place], [address] Phone: [phone]   Fax:  [phone]  Physical Therapy Treatment  Patient Details  Name: Olivia Acosta MRN: [id number] Date of Birth: 06-Oct-1960 Referring Provider: Lindi Adie  Encounter Date: 12/23/2016      PT End of Session - 12/23/16 1222    Visit Number 7   Number of Visits 9   Date for PT Re-Evaluation 01/06/17   PT Start Time 0802   PT Stop Time 8676   PT Time Calculation (min) 45 min   Activity Tolerance Patient tolerated treatment well   Behavior During Therapy [facility] for tasks assessed/performed      Past Medical History:  Diagnosis Date  . Arthritis    fingers stiff  . Breast cancer ([place])    R breast- 2015  . Bronchitis 2016   treated /w IV antibiotics, admitted for 2 nights [facility]  . GERD (gastroesophageal reflux disease)   . Neuropathy    both feet since chemotherapy    Past Surgical History:  Procedure Laterality Date  . BREAST RECONSTRUCTION Bilateral 04/17/2014  . BREAST RECONSTRUCTION Right 07/23/2015   Procedure: RIGHT CHEST WOUND DEBRIDEMENT, RIGHT BREAST RECONSTRUCTION WITH SILICONE GEL IMPLANT;  Surgeon: Crissie Reese, MD;  Location: [place];  Service: Plastics;  Laterality: Right;  . BREAST RECONSTRUCTION WITH PLACEMENT OF TISSUE EXPANDER AND FLEX HD (ACELLULAR HYDRATED DERMIS) Bilateral 04/17/2014   Procedure: BILATERAL BREAST RECONSTRUCTION WITH PLACEMENT OF TISSUE EXPANDER AND FLEX HD (ACELLULAR HYDRATED DERMIS);  Surgeon: Crissie Reese, MD;  Location: [employer];  Service: Plastics;  Laterality: Bilateral;  . COMPLETE MASTECTOMY W/ SENTINEL NODE BIOPSY Bilateral 04/17/2014   NIPPLE SPARING RECONSTRUCTION  . LATISSIMUS FLAP TO BREAST Right 07/23/2015   Procedure: LATISSIMUS FLAP TO RIGHT CHEST;  Surgeon: Crissie Reese, MD;  Location: [place];  Service: Plastics;  Laterality: Right;  . MASTECTOMY W/ SENTINEL NODE BIOPSY Bilateral 04/17/2014   Procedure:  BILATERAL NIPPLE SPARING MASTECTOMY WITH SENTINEL LYMPH NODE BIOPSY;  Surgeon: Excell Seltzer, MD;  Location: [place];  Service: General;  Laterality: Bilateral;  . PORT A CATH REVISION Right 05/27/2014   Procedure: PORT A CATH REVISION;  Surgeon: Excell Seltzer, MD;  Location: [place];  Service: General;  Laterality: Right;  . PORT-A-CATH REMOVAL Right 06/04/2015   Procedure: REMOVAL PORT-A-CATH;  Surgeon: Crissie Reese, MD;  Location: [place];  Service: Plastics;  Laterality: Right;  . PORTACATH PLACEMENT Left 05/27/2014   Procedure: INSERTION PORT-A-CATH;  Surgeon: Excell Seltzer, MD;  Location: [place];  Service: General;  Laterality: Left;  . REMOVAL OF TISSUE EXPANDER AND PLACEMENT OF IMPLANT Bilateral 06/04/2015   Procedure: REMOVAL OF BILATERAL TISSUE EXPANDER AND DELAYED BREAST RECONSTRUCTION PLACEMENT OF IMPLANTS;  Surgeon: Crissie Reese, MD;  Location: [place];  Service: Plastics;  Laterality: Bilateral;  . SALIVARY STONE REMOVAL  2009  . stricture of the uretha  1975  . TONSILLECTOMY    . TONSILLECTOMY AND ADENOIDECTOMY  1974  . URETHRAL STRICTURE DILATATION      There were no vitals filed for this visit.      Subjective Assessment - 12/23/16 0805    Subjective Has been wearing the mastectomy bra and going to the gym every other day, and feels like the swelling has gone down.  It's still tender back around the scar.   Currently in Pain? No/denies  [place] Adult PT Treatment/Exercise - 12/23/16 0001      Self-Care   Self-Care Other Self-Care Comments   Other Self-Care Comments  discussed UE stretches she does currently     Shoulder Exercises: Standing   Other Standing Exercises hands up on top of door jamb and walk into doorway, hold 30 seconds x 1-2; stand and raise right arm out to the side, then lean trunk to left and hold 5 counts x 5-6; then child's pose forward and to each side; supine over  foam roller, "T" and "Y" stretches, 5 counts x 5 each;      Manual Therapy   Manual Therapy Other (comment)   Soft tissue mobilization of scar area without Biotone   Other Manual Therapy soft tissue work to mid-back with focus on right, area around scar where she has muscle tightness                PT Education - 12/23/16 1222    Education provided Yes   Education Details stretches for latissimus dorsi, right flank   Person(s) Educated Patient   Methods Explanation;Demonstration;Verbal cues;Handout   Comprehension Verbalized understanding;Returned demonstration                [facility] Goals - 12/16/16 0813      CC Long Term Goal  #1   Title Patient to be able to perform self drainage independently for long term management of edema   Baseline 12/09/16- pt states she is becoming more comfortable with this, 12/16/16- pt states she feels independent with this   Time 5   Period Weeks   Status Achieved     CC Long Term Goal  #2   Title Pt will be able to independently verbalize lymphedema risk reduction practices   Baseline pt able to independently verbalize these on 12/09/16   Time 5   Period Weeks   Status Achieved     CC Long Term Goal  #4   Title Pt will demonstrate 160 degrees of right shoulder flexion to allow her to reach items overhead   Baseline 139, 12/09/16- 139, 12/16/16- 145   Time 5   Period Weeks   Status On-going     CC Long Term Goal  #5   Title Pt will receive appropriate compression garments for management of right upper extremity and flank lymphedema   Baseline 12/16/16- Sent rx to get signed   Time 5   Period Weeks   Status On-going            Plan - 12/23/16 1223    Clinical Impression Statement Pt. did well with new stretches for her right flank and back areas.  Soft tissue work again performed to promote relaxation of back muscles that tend to spasm.  She has significant palpable tightness around her latissimus dorsi scar,  especially medial to it and at superior lateral aspect. She did not feel she needed manual lymph drainage today, reporting that her swelling had reduced with wearing her camisole and doing her gym workouts. Compression garment prescription had been faxed back but without signature, so was refaxed.   Rehab Potential Excellent   Clinical Impairments Affecting Rehab Potential hx of radiation   PT Frequency 2x / week   PT Duration 4 weeks   PT Treatment/Interventions ADLs/Self Care Home Management;DME Instruction;Therapeutic exercise;Patient/family education;Manual techniques;Manual lymph drainage;Passive range of motion;Orthotic Fit/Training;Compression bandaging;Scar mobilization;Taping   PT Next Visit Plan Review self-manual lymph drainage prn.  Continue stretching and soft  tissue work for right flank, ROM for right shoulder. Check again for signed Rx for sleeve.   PT Home Exercise Plan Dowel exercises, supine scap, right flank and back stretches   Recommended Other Services refaxed Rx for compression sleeve and bra   Consulted and Agree with Plan of Care Patient      Patient will benefit from skilled therapeutic intervention in order to improve the following deficits and impairments:  Decreased range of motion, Decreased strength, Impaired UE functional use, Pain, Increased fascial restricitons, Decreased knowledge of use of DME, Decreased scar mobility, Postural dysfunction, Increased edema  Visit Diagnosis: Stiffness of right shoulder, not elsewhere classified  Muscle weakness (generalized)  Chronic right shoulder pain     Problem List Patient Active Problem List   Diagnosis Date Noted  . Left foot pain 09/19/2016  . Breast cancer in female [facility]) 07/23/2015  . Breast cancer, right breast ([place]) 06/04/2015  . Fever 09/14/2014  . Leukopenia 09/14/2014  . Flu-like symptoms 09/14/2014  . Acute bronchitis 09/14/2014  . Breast cancer of lower-outer quadrant of right female breast ([place])  04/17/2014  . DEPRESSION 08/03/2007  . SWELLING MASS OR LUMP IN HEAD AND NECK 08/03/2007    SALISBURY,DONNA 12/23/2016, 12:27 PM  [place], [place], [address] Phone: [phone]   Fax:  [phone]  Name: Sui Kasparek MRN: [id number] Date of Birth: 1961/03/19  Serafina Royals, PT 12/23/16 12:27 PM

## 2016-12-23 NOTE — Patient Instructions (Signed)
DO THE FOLLOWING DAILY (OR CHOOSE SOME FROM THESE):  Lower Trunk Rotation    Bring both knees in to chest. Rotate from side to side, keeping knees together and feet off floor. Hold for 30 seconds. Repeat __1-2__ times per set. Do __1__ sets per session. Do ____ sessions per day.  http://orth.exer.us/151   Copyright  VHI. All rights reserved.  Side Stretch    With feet shoulder width apart, hands on hips, inhale. Then exhale while bending directly to side, keeping top arm outstretched. Hold 10 counts. Slowly return to starting position. Repeat to other side. Repeat __3-5__ times, each side.  Copyright  VHI. All rights reserved.  BACK: Child's Pose (Sciatica)    Sit in knee-chest position and reach arms forward. Separate knees for comfort. Hold position for 30 seconds if you can,. Repeat _1-2__ times. Do __1_ times per day.   Okay also to walk your hands to each side for a different stretch.  Copyright  VHI. All rights reserved.  CHEST: Doorway, Bilateral - Standing    Standing in doorway, place hands on AT Crane. Lean forward. Hold __30_ seconds. __1-2_ reps per set, _1__ sets per day, __6-7_ days per week  Copyright  VHI. All rights reserved.    Lie on your foam roller, with the roller running the length of your spine. 1) Spread arms out to the sides, like you're making a "T" with body and arms.  Hold 10 counts and do 3-5 reps. 2) Take arms up into a "Y" position.  Hold 10 counts and do 3-5 reps.

## 2016-12-30 ENCOUNTER — Ambulatory Visit: Payer: BLUE CROSS/BLUE SHIELD | Admitting: Physical Therapy

## 2016-12-30 DIAGNOSIS — I890 Lymphedema, not elsewhere classified: Secondary | ICD-10-CM

## 2016-12-30 DIAGNOSIS — M25611 Stiffness of right shoulder, not elsewhere classified: Secondary | ICD-10-CM | POA: Diagnosis not present

## 2016-12-30 DIAGNOSIS — G8929 Other chronic pain: Secondary | ICD-10-CM

## 2016-12-30 DIAGNOSIS — M25511 Pain in right shoulder: Secondary | ICD-10-CM

## 2016-12-30 DIAGNOSIS — M6281 Muscle weakness (generalized): Secondary | ICD-10-CM

## 2016-12-30 NOTE — Therapy (Signed)
[place], [place], [address] Phone: [phone]   Fax:  [phone]  Physical Therapy Treatment  Patient Details  Name: Olivia Acosta MRN: [id number] Date of Birth: [birth year] Referring Provider: Lindi Adie  Encounter Date: 12/30/2016      PT End of Session - 12/30/16 1306    Visit Number 8   Number of Visits 9   Date for PT Re-Evaluation 01/06/17   PT Start Time 0801   PT Stop Time 0845   PT Time Calculation (min) 44 min   Activity Tolerance Patient tolerated treatment well   Behavior During Therapy [facility] for tasks assessed/performed      Past Medical History:  Diagnosis Date  . Arthritis    fingers stiff  . Breast cancer ([place])    R breast- 2015  . Bronchitis 2016   treated /w IV antibiotics, admitted for 2 nights [facility]  . GERD (gastroesophageal reflux disease)   . Neuropathy    both feet since chemotherapy    Past Surgical History:  Procedure Laterality Date  . BREAST RECONSTRUCTION Bilateral 04/17/2014  . BREAST RECONSTRUCTION Right 07/23/2015   Procedure: RIGHT CHEST WOUND DEBRIDEMENT, RIGHT BREAST RECONSTRUCTION WITH SILICONE GEL IMPLANT;  Surgeon: Crissie Reese, MD;  Location: [place];  Service: Plastics;  Laterality: Right;  . BREAST RECONSTRUCTION WITH PLACEMENT OF TISSUE EXPANDER AND FLEX HD (ACELLULAR HYDRATED DERMIS) Bilateral 04/17/2014   Procedure: BILATERAL BREAST RECONSTRUCTION WITH PLACEMENT OF TISSUE EXPANDER AND FLEX HD (ACELLULAR HYDRATED DERMIS);  Surgeon: Crissie Reese, MD;  Location: [place];  Service: Plastics;  Laterality: Bilateral;  . COMPLETE MASTECTOMY W/ SENTINEL NODE BIOPSY Bilateral 04/17/2014   NIPPLE SPARING RECONSTRUCTION  . LATISSIMUS FLAP TO BREAST Right 07/23/2015   Procedure: LATISSIMUS FLAP TO RIGHT CHEST;  Surgeon: Crissie Reese, MD;  Location: [place];  Service: Plastics;  Laterality: Right;  . MASTECTOMY W/ SENTINEL NODE BIOPSY Bilateral 04/17/2014   Procedure:  BILATERAL NIPPLE SPARING MASTECTOMY WITH SENTINEL LYMPH NODE BIOPSY;  Surgeon: Excell Seltzer, MD;  Location: [place];  Service: General;  Laterality: Bilateral;  . PORT A CATH REVISION Right 05/27/2014   Procedure: PORT A CATH REVISION;  Surgeon: Excell Seltzer, MD;  Location: [place];  Service: General;  Laterality: Right;  . PORT-A-CATH REMOVAL Right 06/04/2015   Procedure: REMOVAL PORT-A-CATH;  Surgeon: Crissie Reese, MD;  Location: [place];  Service: Plastics;  Laterality: Right;  . PORTACATH PLACEMENT Left 05/27/2014   Procedure: INSERTION PORT-A-CATH;  Surgeon: Excell Seltzer, MD;  Location: [place];  Service: General;  Laterality: Left;  . REMOVAL OF TISSUE EXPANDER AND PLACEMENT OF IMPLANT Bilateral 06/04/2015   Procedure: REMOVAL OF BILATERAL TISSUE EXPANDER AND DELAYED BREAST RECONSTRUCTION PLACEMENT OF IMPLANTS;  Surgeon: Crissie Reese, MD;  Location: [place];  Service: Plastics;  Laterality: Bilateral;  . SALIVARY STONE REMOVAL  2009  . stricture of the uretha  1975  . TONSILLECTOMY    . TONSILLECTOMY AND ADENOIDECTOMY  1974  . URETHRAL STRICTURE DILATATION      There were no vitals filed for this visit.      Subjective Assessment - 12/30/16 0811    Subjective Pt has been doing well with exercise and has been working to improve  her metaboism she has been wearing the chip pack for right lateral chest at night    Pertinent History double mastectomy 04/2014, chemo started Jan 2016, radiation June 2016, reconstructive surgery 05/2015, the tissue gave way and did not heal on  R side so pt had lat dorsi flap procedure in 07/2015   Patient Stated Goals increase R shoulder ROM, get in shape, get swelling under control   Currently in Pain? No/denies                         [facility] Adult PT Treatment/Exercise - 12/30/16 0001      Self-Care   Self-Care Other Self-Care Comments   Other Self-Care Comments  encouraged pt to continue  exercise and pay attention to keeping shoulder back and down.  script not back for dr. Kalman Shan faxed again      Shoulder Exercises: Sidelying   Other Sidelying Exercises from left sidelying, right UE horizontal abduction with backward thoracic rotation.      Shoulder Exercises: Standing   Row Both;10 reps;Theraband  elbows extended    Theraband Level (Shoulder Row) Level 3 (Green)   Row Limitations used mirror for verbal cues for scapular activation symmetry and occasionala verbal cues to keep shoulders down.   Other Standing Exercises squats with shoulders back and down when she stands up x 10    Other Standing Exercises right elbow at side with lunge to right with ratraction f     Manual Therapy   Manual Therapy Taping   Kinesiotex Edema     Kinesiotix   Edema skinkote, then fan shaped kinesiotpe from full area at back toward groin on the right side                 PT Education - 12/30/16 1306    Education provided Yes   Education Details use mirror for visual feedback of scapular muscle activity.  added more scapular exercise    Person(s) Educated Patient   Methods Explanation;Demonstration;Handout;Verbal cues   Comprehension Verbalized understanding;Returned demonstration                [facility] Goals - 12/16/16 0813      CC Long Term Goal  #1   Title Patient to be able to perform self drainage independently for long term management of edema   Baseline 12/09/16- pt states she is becoming more comfortable with this, 12/16/16- pt states she feels independent with this   Time 5   Period Weeks   Status Achieved     CC Long Term Goal  #2   Title Pt will be able to independently verbalize lymphedema risk reduction practices   Baseline pt able to independently verbalize these on 12/09/16   Time 5   Period Weeks   Status Achieved     CC Long Term Goal  #4   Title Pt will demonstrate 160 degrees of right shoulder flexion to allow her to reach items overhead    Baseline 139, 12/09/16- 139, 12/16/16- 145   Time 5   Period Weeks   Status On-going     CC Long Term Goal  #5   Title Pt will receive appropriate compression garments for management of right upper extremity and flank lymphedema   Baseline 12/16/16- Sent rx to get signed   Time 5   Period Weeks   Status On-going            Plan - 12/30/16 1307    Clinical Impression Statement Pt is doing well, but still needs continued work on scapular strength and endurance.  Upgraded home exercise and added kinesiotape to left posterior lateral chest edema    Rehab Potential Excellent   Clinical Impairments  Affecting Rehab Potential hx of radiation   PT Frequency 2x / week   PT Duration 4 weeks   PT Treatment/Interventions ADLs/Self Care Home Management;DME Instruction;Therapeutic exercise;Patient/family education;Manual techniques;Manual lymph drainage;Passive range of motion;Orthotic Fit/Training;Compression bandaging;Scar mobilization;Taping   PT Next Visit Plan Check effect of kinesiotape.  Follow up on sleeve, fax to to [facility] if needed  upgrad exercise prn    Consulted and Agree with Plan of Care Patient      Patient will benefit from skilled therapeutic intervention in order to improve the following deficits and impairments:  Decreased range of motion, Decreased strength, Impaired UE functional use, Pain, Increased fascial restricitons, Decreased knowledge of use of DME, Decreased scar mobility, Postural dysfunction, Increased edema  Visit Diagnosis: Stiffness of right shoulder, not elsewhere classified  Muscle weakness (generalized)  Chronic right shoulder pain  Lymphedema, not elsewhere classified     Problem List Patient Active Problem List   Diagnosis Date Noted  . Left foot pain 09/19/2016  . Breast cancer in female [facility]) 07/23/2015  . Breast cancer, right breast ([place]) 06/04/2015  . Fever 09/14/2014  . Leukopenia 09/14/2014  . Flu-like  symptoms 09/14/2014  . Acute bronchitis 09/14/2014  . Breast cancer of lower-outer quadrant of right female breast ([place]) 04/17/2014  . DEPRESSION 08/03/2007  . SWELLING MASS OR LUMP IN HEAD AND NECK 08/03/2007   Donato Heinz. Owens Shark PT  [place] Levo 12/30/2016, 1:12 PM  [place], [place], [address] Phone: [phone]   Fax:  [phone]  Name: Thurley Francesconi MRN: [id number] Date of Birth: [birth year]

## 2016-12-30 NOTE — Patient Instructions (Signed)
Low rows with green band.  Use mirror for cues for shoulder bald symmetry  Elbows at side, band in fists. Pull fists apart.  Squats with intentional shoulders back and down  Elbows at side, lunge to the right and squeeze shoulder blades together to rotate mid back  Lying on left side take right arm, breathe and return

## 2017-01-06 ENCOUNTER — Encounter: Payer: BLUE CROSS/BLUE SHIELD | Admitting: Physical Therapy

## 2017-01-11 ENCOUNTER — Ambulatory Visit: Payer: BLUE CROSS/BLUE SHIELD | Attending: Hematology and Oncology | Admitting: Physical Therapy

## 2017-01-11 DIAGNOSIS — M25511 Pain in right shoulder: Secondary | ICD-10-CM | POA: Insufficient documentation

## 2017-01-11 DIAGNOSIS — I890 Lymphedema, not elsewhere classified: Secondary | ICD-10-CM | POA: Insufficient documentation

## 2017-01-11 DIAGNOSIS — M25611 Stiffness of right shoulder, not elsewhere classified: Secondary | ICD-10-CM | POA: Diagnosis not present

## 2017-01-11 DIAGNOSIS — G8929 Other chronic pain: Secondary | ICD-10-CM | POA: Insufficient documentation

## 2017-01-11 DIAGNOSIS — M6281 Muscle weakness (generalized): Secondary | ICD-10-CM | POA: Diagnosis present

## 2017-01-11 NOTE — Therapy (Addendum)
[facility] [address] [place], [place], [address] Phone: [phone]   Fax:  [phone]  Physical Therapy Treatment  Patient Details  Name: Olivia Acosta MRN: [id number] Date of Birth: 07/23/1960 Referring Provider: Gudena  Encounter Date: 01/11/2017      PT End of Session - 01/11/17 1709    Visit Number 9   Number of Visits 17  if needed   Date for PT Re-Evaluation 02/10/17   PT Start Time 1611   PT Stop Time 1656   PT Time Calculation (min) 45 min   Activity Tolerance Patient tolerated treatment well   Behavior During Therapy WFL for tasks assessed/performed      Past Medical History:  Diagnosis Date  . Arthritis    fingers stiff  . Breast cancer (HCC)    R breast- 2015  . Bronchitis 2016   treated /w IV antibiotics, admitted for 2 nights [facility]  . GERD (gastroesophageal reflux disease)   . Neuropathy    both feet since chemotherapy    Past Surgical History:  Procedure Laterality Date  . BREAST RECONSTRUCTION Bilateral 04/17/2014  . BREAST RECONSTRUCTION Right 07/23/2015   Procedure: RIGHT CHEST WOUND DEBRIDEMENT, RIGHT BREAST RECONSTRUCTION WITH SILICONE GEL IMPLANT;  Surgeon: David Bowers, MD;  Location: [facility] OR;  Service: Plastics;  Laterality: Right;  . BREAST RECONSTRUCTION WITH PLACEMENT OF TISSUE EXPANDER AND FLEX HD (ACELLULAR HYDRATED DERMIS) Bilateral 04/17/2014   Procedure: BILATERAL BREAST RECONSTRUCTION WITH PLACEMENT OF TISSUE EXPANDER AND FLEX HD (ACELLULAR HYDRATED DERMIS);  Surgeon: David Bowers, MD;  Location: [facility] OR;  Service: Plastics;  Laterality: Bilateral;  . COMPLETE MASTECTOMY W/ SENTINEL NODE BIOPSY Bilateral 04/17/2014   NIPPLE SPARING RECONSTRUCTION  . LATISSIMUS FLAP TO BREAST Right 07/23/2015   Procedure: LATISSIMUS FLAP TO RIGHT CHEST;  Surgeon: David Bowers, MD;  Location: [facility] OR;  Service: Plastics;  Laterality: Right;  . MASTECTOMY W/ SENTINEL NODE BIOPSY Bilateral 04/17/2014    Procedure: BILATERAL NIPPLE SPARING MASTECTOMY WITH SENTINEL LYMPH NODE BIOPSY;  Surgeon: Benjamin Hoxworth, MD;  Location: [facility] OR;  Service: General;  Laterality: Bilateral;  . PORT A CATH REVISION Right 05/27/2014   Procedure: PORT A CATH REVISION;  Surgeon: Benjamin Hoxworth, MD;  Location: [facility];  Service: General;  Laterality: Right;  . PORT-A-CATH REMOVAL Right 06/04/2015   Procedure: REMOVAL PORT-A-CATH;  Surgeon: David Bowers, MD;  Location: [facility];  Service: Plastics;  Laterality: Right;  . PORTACATH PLACEMENT Left 05/27/2014   Procedure: INSERTION PORT-A-CATH;  Surgeon: Benjamin Hoxworth, MD;  Location: [facility];  Service: General;  Laterality: Left;  . REMOVAL OF TISSUE EXPANDER AND PLACEMENT OF IMPLANT Bilateral 06/04/2015   Procedure: REMOVAL OF BILATERAL TISSUE EXPANDER AND DELAYED BREAST RECONSTRUCTION PLACEMENT OF IMPLANTS;  Surgeon: David Bowers, MD;  Location: [facility];  Service: Plastics;  Laterality: Bilateral;  . SALIVARY STONE REMOVAL  2009  . stricture of the uretha  1975  . TONSILLECTOMY    . TONSILLECTOMY AND ADENOIDECTOMY  1974  . URETHRAL STRICTURE DILATATION      There were no vitals filed for this visit.      Subjective Assessment - 01/11/17 1706    Subjective "I've been working out at [employer] 3 days a week and doing exercises at home."  Has been moving her household this week so is tired. Plans to call Dr. Bowers for follow-up.   Currently in Pain? No/denies                           [facility] Adult PT Treatment/Exercise - 01/11/17 0001      Manual Therapy   Manual Therapy Myofascial release   Soft tissue mobilization of scar area without Biotone   Myofascial Release Rt. UE myofascial pulling and pulling with concurrent distraction at lower right flank   Manual Lymphatic Drainage (MLD) short neck, diaphragmatic breathing, Rt. inguinal nodes and axillo-inguinal anastomosis, and right UE from dorsal hand to  shoulder, all in supine   Passive ROM to RUE in direction of flexion, abduction and ER                        Long Term Clinic Goals - 01/11/17 1710      CC Long Term Goal  #1   Title Patient to be able to perform self drainage independently for long term management of edema   Status Achieved     CC Long Term Goal  #2   Title Pt will be able to independently verbalize lymphedema risk reduction practices   Status Achieved     CC Long Term Goal  #3   Title Patient will be independent in a home exercise program for shoulder range of motion and strengthening   Status Achieved     CC Long Term Goal  #4   Title Pt will demonstrate 160 degrees of right shoulder flexion to allow her to reach items overhead   Status On-going     CC Long Term Goal  #5   Title Pt will receive appropriate compression garments for management of right upper extremity and flank lymphedema   Status On-going            Plan - 01/11/17 1711    Clinical Impression Statement Goals could not be assessed today because the electronic charting system was down during patient's visit.  However, she continues to have tightness in right shoulder that will benefit from further therapy. She has increased her independent exercise program to include gym workouts.   Rehab Potential Excellent   Clinical Impairments Affecting Rehab Potential hx of radiation   PT Frequency 2x / week   PT Duration 2 weeks  to 4 weeks prn   PT Treatment/Interventions ADLs/Self Care Home Management;DME Instruction;Therapeutic exercise;Patient/family education;Manual techniques;Manual lymph drainage;Passive range of motion;Orthotic Fit/Training;Compression bandaging;Scar mobilization;Taping   PT Next Visit Plan Pt. plans to call herself about getting the prescription signed because it has not been returned after being sent twice.  Continue manual work to improve patient's flexibility and continue soft tissue mobilization, myofascial  release.   PT Home Exercise Plan Dowel exercises, supine scap, right flank and back stretches   Consulted and Agree with Plan of Care Patient      Patient will benefit from skilled therapeutic intervention in order to improve the following deficits and impairments:  Decreased range of motion, Decreased strength, Impaired UE functional use, Pain, Increased fascial restricitons, Decreased knowledge of use of DME, Decreased scar mobility, Postural dysfunction, Increased edema  Visit Diagnosis: Stiffness of right shoulder, not elsewhere classified - Plan: PT plan of care cert/re-cert  Muscle weakness (generalized) - Plan: PT plan of care cert/re-cert  Chronic right shoulder pain - Plan: PT plan of care cert/re-cert  Lymphedema, not elsewhere classified - Plan: PT plan of care cert/re-cert     Problem List Patient Active Problem List   Diagnosis Date Noted  . Left foot pain 09/19/2016  . Breast cancer in female (HCC) 07/23/2015  . Breast cancer, right breast (HCC) 06/04/2015  .   Fever 09/14/2014  . Leukopenia 09/14/2014  . Flu-like symptoms 09/14/2014  . Acute bronchitis 09/14/2014  . Breast cancer of lower-outer quadrant of right female breast ([place]) 04/17/2014  . DEPRESSION 08/03/2007  . SWELLING MASS OR LUMP IN HEAD AND NECK 08/03/2007    SALISBURY,DONNA 01/11/2017, 5:18 PM  [place], [place], [address] Phone: [phone]   Fax:  [phone]  Name: Olivia Acosta MRN: [id number] Date of Birth: 1960/11/21  Serafina Royals, PT 01/11/17 5:18 PM  PHYSICAL THERAPY DISCHARGE SUMMARY  Visits from Start of Care: 9  Current functional level related to goals / functional outcomes: Pt.'s goals were partially met at the time of this visit.  The plan was for her to continue therapy, but she did not.   Remaining deficits: Unknown, as patient did not return to complete her course of therapy.    Education / Equipment: Self-care, home exercise program. Plan: Patient agrees to discharge.  Patient goals were partially met. Patient is being discharged due to not returning since the last visit.  ?????    Serafina Royals, PT 08/31/17 4:17 PM

## 2017-02-10 ENCOUNTER — Other Ambulatory Visit: Payer: Self-pay | Admitting: Hematology and Oncology

## 2017-02-10 DIAGNOSIS — C50511 Malignant neoplasm of lower-outer quadrant of right female breast: Secondary | ICD-10-CM

## 2017-02-14 ENCOUNTER — Other Ambulatory Visit: Payer: Self-pay | Admitting: *Deleted

## 2017-02-14 ENCOUNTER — Telehealth: Payer: Self-pay | Admitting: *Deleted

## 2017-02-14 DIAGNOSIS — Z170 Estrogen receptor positive status [ER+]: Principal | ICD-10-CM

## 2017-02-14 DIAGNOSIS — C50511 Malignant neoplasm of lower-outer quadrant of right female breast: Secondary | ICD-10-CM

## 2017-02-14 MED ORDER — ESOMEPRAZOLE MAGNESIUM 20 MG PO CPDR: DELAYED_RELEASE_CAPSULE | ORAL | 2 refills | Status: DC

## 2017-02-14 NOTE — Telephone Encounter (Signed)
This RN spoke with pt per her call stating concern for onset of increased episodes of heartburn - as well as occasional nausea, 1 episode dizziness ( while exercising ) and an episode of tachycardia.  Per discussion - Nylan has history of GERD but due to decreased issues was only taken her Nexium as needed. " I only take it if I am having issues but not every day  She has started a new exercise regimen including which is more strenuous as well as was trying intermittent fasting. Pt eats dinner by 7 pm and then fast until later in morning the following day. She is waking up at night with GERD and nausea.  She stated increased stress factors including her daughter was in pediatric ICU do to a drug overdose.  " my mom said I should call because Dr Lindi Adie always asked about these kind of things and said to call if needed "  This RN reviewed symptoms as well as - pt denies any SOB or clammy - sweats. She has no numbness in arms.  Per discussion this RN informed pt above may be related to GERD and need to take her Nexium daily regardless if she is having symptoms. Discussed that she may need to do a change in diet from intermittent fasting to low carb while she is experiencing these symptoms.  This RN encouraged her to keep up with her exercise regimen.  Pt is to call next week with update for benefit of daily Nexium.  This note will be given to MD for review and any further recommendations.

## 2017-02-14 NOTE — Telephone Encounter (Signed)
See other entry 

## 2017-05-08 ENCOUNTER — Other Ambulatory Visit: Payer: Self-pay | Admitting: Hematology and Oncology

## 2017-05-08 DIAGNOSIS — C50511 Malignant neoplasm of lower-outer quadrant of right female breast: Secondary | ICD-10-CM

## 2017-05-09 ENCOUNTER — Other Ambulatory Visit: Payer: Self-pay

## 2017-05-09 DIAGNOSIS — C50511 Malignant neoplasm of lower-outer quadrant of right female breast: Secondary | ICD-10-CM

## 2017-05-09 MED ORDER — ANASTROZOLE 1 MG PO TABS: ORAL_TABLET | ORAL | 3 refills | Status: DC

## 2017-09-19 ENCOUNTER — Telehealth: Payer: Self-pay | Admitting: Hematology and Oncology

## 2017-09-19 NOTE — Telephone Encounter (Signed)
Let message for patient regarding voicemail she left earlier to r/s upcoming May appointment.

## 2017-10-24 ENCOUNTER — Ambulatory Visit: Payer: Medicaid Other | Admitting: Hematology and Oncology

## 2017-11-06 ENCOUNTER — Telehealth: Payer: Self-pay | Admitting: Hematology and Oncology

## 2017-11-06 ENCOUNTER — Inpatient Hospital Stay: Payer: Commercial Managed Care - PPO | Attending: Hematology and Oncology | Admitting: Hematology and Oncology

## 2017-11-06 DIAGNOSIS — Z170 Estrogen receptor positive status [ER+]: Secondary | ICD-10-CM | POA: Diagnosis not present

## 2017-11-06 DIAGNOSIS — C50511 Malignant neoplasm of lower-outer quadrant of right female breast: Secondary | ICD-10-CM | POA: Insufficient documentation

## 2017-11-06 DIAGNOSIS — Z79811 Long term (current) use of aromatase inhibitors: Secondary | ICD-10-CM | POA: Insufficient documentation

## 2017-11-06 DIAGNOSIS — Z923 Personal history of irradiation: Secondary | ICD-10-CM | POA: Insufficient documentation

## 2017-11-06 DIAGNOSIS — Z9013 Acquired absence of bilateral breasts and nipples: Secondary | ICD-10-CM | POA: Diagnosis not present

## 2017-11-06 DIAGNOSIS — Z9221 Personal history of antineoplastic chemotherapy: Secondary | ICD-10-CM | POA: Insufficient documentation

## 2017-11-06 MED ORDER — ANASTROZOLE 1 MG PO TABS: ORAL_TABLET | ORAL | 3 refills | Status: DC

## 2017-11-06 NOTE — Telephone Encounter (Signed)
Gave avs and calendar ° °

## 2017-11-06 NOTE — Progress Notes (Signed)
Patient Care Team: Dian Queen, MD as PCP - General (Obstetrics and Gynecology)  DIAGNOSIS:  Encounter Diagnoses  Name Primary?  . Malignant neoplasm of lower-outer quadrant of right breast of female, estrogen receptor positive ([place])   . Breast cancer of lower-outer quadrant of right female breast ([place])     SUMMARY OF ONCOLOGIC HISTORY:   Breast cancer of lower-outer quadrant of right female breast ([place])   01/31/2014 Breast MRI    Right breast mass 3.3 x 2.8 x 3.4 cm with several foci of subcentimeter enhancing nodules extending anteriorly to worsen nipple up to 5.4 cm anterior to the mass       04/17/2014 Surgery    Bilateral mastectomies, left: Benign, right breast multifocal ILC grade 2 largest 5.5 cm, posterior margin focally positive, 2 SLN's positive with extracapsular extension + 4/10 axillary LN positive, ER 92%, PR 98%, HER-2 -1.1.3, Ki 67 10%      06/12/2014 - 10/23/2014 Chemotherapy    Adriamycin and Cytoxan dose dense every 2 weeks x4 followed by Taxol x12      09/14/2014 - 09/16/2014 Hospital Admission    Neutropenic fever      12/09/2014 - 01/20/2015 Radiation Therapy    Adjuvant radiation therapy      02/02/2015 -  Anti-estrogen oral therapy    Anastrozole 1 mg daily X 5 years       CHIEF COMPLIANT: Follow-up on anastrozole therapy  INTERVAL HISTORY: Olivia Acosta is a 30-year with above-mentioned history of right breast cancer treated with bilateral mastectomies followed by adjuvant chemotherapy and radiation and is currently on anastrozole.  She is tolerating anastrozole extremely well.  She does not have any hot flashes.  She has intermittent arthralgias but does not attributed to anastrozole therapy.  REVIEW OF SYSTEMS:   Constitutional: Denies fevers, chills or abnormal weight loss Eyes: Denies blurriness of vision Ears, nose, mouth, throat, and face: Denies mucositis or sore throat Respiratory: Denies cough, dyspnea or wheezes Cardiovascular:  Denies palpitation, chest discomfort Gastrointestinal:  Denies nausea, heartburn or change in bowel habits Skin: Denies abnormal skin rashes Lymphatics: Denies new lymphadenopathy or easy bruising Neurological:Denies numbness, tingling or new weaknesses Behavioral/Psych: Mood is stable, no new changes  Extremities: No lower extremity edema Breast:  denies any pain or lumps or nodules in either breasts All other systems were reviewed with the patient and are negative.  I have reviewed the past medical history, past surgical history, social history and family history with the patient and they are unchanged from previous note.  ALLERGIES:  is allergic to ciprofloxacin and penicillins.  MEDICATIONS:  Current Outpatient Medications  Medication Sig Dispense Refill  . acetaminophen (TYLENOL) 500 MG tablet Take by mouth.    Marland Kitchen anastrozole (ARIMIDEX) 1 MG tablet TAKE 1 TABLET(1 MG) BY MOUTH DAILY 90 tablet 3  . esomeprazole (NEXIUM) 20 MG capsule TAKE 1 CAPSULE BY MOUTH EVERY DAY AT 12 NOON 30 capsule 2  . levothyroxine (SYNTHROID, LEVOTHROID) 50 MCG tablet TK 1 T PO QD  6  . Probiotic Product (EQ PROBIOTIC) CAPS Take by mouth.    . valACYclovir (VALTREX) 500 MG tablet Take 500 mg by mouth daily before breakfast.      No current facility-administered medications for this visit.     PHYSICAL EXAMINATION: ECOG PERFORMANCE STATUS: 1 - Symptomatic but completely ambulatory  Vitals:   11/06/17 1513  BP: 98/60  Pulse: 77  Resp: 18  Temp: 98.6 F (37 C)  SpO2: 99%   Filed Weights  11/06/17 1513  Weight: 179 lb 1.6 oz (81.2 kg)    GENERAL:alert, no distress and comfortable SKIN: skin color, texture, turgor are normal, no rashes or significant lesions EYES: normal, Conjunctiva are pink and non-injected, sclera clear OROPHARYNX:no exudate, no erythema and lips, buccal mucosa, and tongue normal  NECK: supple, thyroid normal size, non-tender, without nodularity LYMPH:  no palpable  lymphadenopathy in the cervical, axillary or inguinal LUNGS: clear to auscultation and percussion with normal breathing effort HEART: regular rate & rhythm and no murmurs and no lower extremity edema ABDOMEN:abdomen soft, non-tender and normal bowel sounds MUSCULOSKELETAL:no cyanosis of digits and no clubbing  NEURO: alert & oriented x 3 with fluent speech, no focal motor/sensory deficits EXTREMITIES: No lower extremity edema BREAST: No palpable masses or nodules in either right or left breasts. No palpable axillary supraclavicular or infraclavicular adenopathy no breast tenderness or nipple discharge. (exam performed in the presence of a chaperone)  LABORATORY DATA:  I have reviewed the data as listed CMP Latest Ref Rng & Units 07/23/2015 06/05/2015 05/26/2015  Glucose 65 - 99 mg/dL 89 105(H) 100(H)  BUN 6 - 20 mg/dL 13 5(L) 12  Creatinine 0.44 - 1.00 mg/dL 0.93 0.74 0.76  Sodium 135 - 145 mmol/L 139 142 141  Potassium 3.5 - 5.1 mmol/L 4.3 3.9 4.1  Chloride 101 - 111 mmol/L 102 106 105  CO2 22 - 32 mmol/L '24 26 25  '$ Calcium 8.9 - 10.3 mg/dL 9.5 8.9 9.7  Total Protein 6.5 - 8.1 g/dL - - -  Total Bilirubin 0.3 - 1.2 mg/dL - - -  Alkaline Phos 38 - 126 U/L - - -  AST 15 - 41 U/L - - -  ALT 14 - 54 U/L - - -    Lab Results  Component Value Date   WBC 4.8 07/23/2015   HGB 11.9 (L) 07/23/2015   HCT 36.6 07/23/2015   MCV 90.8 07/23/2015   PLT 291 07/23/2015   NEUTROABS 3.0 11/03/2014    ASSESSMENT & PLAN:  Breast cancer of lower-outer quadrant of right female breast Right breast invasive lobular cancer 5.5 cm multifocal disease with 6/12 lymph nodes positive T3, N2, M0 stage IIIa, ER 92%, PR 98%, HER-2 negative ratio 1.13, Ki-67 10% status post bilateral mastectomies on 04/17/2014, status post adjuvant chemotherapy started 06/12/2014 and completed 10/23/2014, started radiation June 2016 completed August 2016. Started anastrozole 02/02/2015 5 years  Anastrozole toxicities: Denies  any hot flashes or myalgias. Tolerating it extremely well. She does have some muscle stiffness and achiness in her hands but it gets better with activity.  Breast reconstruction with latissimus dorsi flap done in December 2016 and February 2017.  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  The patient has a good understanding of the overall plan. she agrees with it. she will call with any problems that may develop before the next visit here.   Harriette Ohara, MD 11/06/17

## 2017-11-06 NOTE — Assessment & Plan Note (Signed)
Right breast invasive lobular cancer 5.5 cm multifocal disease with 6/12 lymph nodes positive T3, N2, M0 stage IIIa, ER 92%, PR 98%, HER-2 negative ratio 1.13, Ki-67 10% status post bilateral mastectomies on 04/17/2014, status post adjuvant chemotherapy started 06/12/2014 and completed 10/23/2014, started radiation June 2016 completed August 2016. Started anastrozole 02/02/2015 5 years  Anastrozole toxicities: Denies any hot flashes or myalgias. Tolerating it extremely well. She does have some muscle stiffness and achiness in her hands but it gets better with activity.  Breast reconstruction with latissimus dorsi flap done in December 2016 and February 2017.  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 

## 2017-11-14 DIAGNOSIS — E039 Hypothyroidism, unspecified: Secondary | ICD-10-CM | POA: Diagnosis not present

## 2017-11-14 DIAGNOSIS — R7301 Impaired fasting glucose: Secondary | ICD-10-CM | POA: Diagnosis not present

## 2017-11-21 ENCOUNTER — Encounter: Payer: Self-pay | Admitting: Family Medicine

## 2017-11-21 ENCOUNTER — Ambulatory Visit (INDEPENDENT_AMBULATORY_CARE_PROVIDER_SITE_OTHER): Payer: Commercial Managed Care - PPO | Admitting: Family Medicine

## 2017-11-21 VITALS — BP 102/80 | HR 68 | Temp 98.2°F | Wt 174.0 lb

## 2017-11-21 DIAGNOSIS — R635 Abnormal weight gain: Secondary | ICD-10-CM | POA: Diagnosis not present

## 2017-11-21 DIAGNOSIS — E039 Hypothyroidism, unspecified: Secondary | ICD-10-CM | POA: Diagnosis not present

## 2017-11-21 DIAGNOSIS — Z7689 Persons encountering health services in other specified circumstances: Secondary | ICD-10-CM

## 2017-11-21 DIAGNOSIS — K219 Gastro-esophageal reflux disease without esophagitis: Secondary | ICD-10-CM

## 2017-11-21 NOTE — Patient Instructions (Signed)
Gastroesophageal Reflux Disease, Adult Normally, food travels down the esophagus and stays in the stomach to be digested. However, when a person has gastroesophageal reflux disease (GERD), food and stomach acid move back up into the esophagus. When this happens, the esophagus becomes sore and inflamed. Over time, GERD can create small holes (ulcers) in the lining of the esophagus. What are the causes? This condition is caused by a problem with the muscle between the esophagus and the stomach (lower esophageal sphincter, or LES). Normally, the LES muscle closes after food passes through the esophagus to the stomach. When the LES is weakened or abnormal, it does not close properly, and that allows food and stomach acid to go back up into the esophagus. The LES can be weakened by certain dietary substances, medicines, and medical conditions, including:  Tobacco use.  Pregnancy.  Having a hiatal hernia.  Heavy alcohol use.  Certain foods and beverages, such as coffee, chocolate, onions, and peppermint.  What increases the risk? This condition is more likely to develop in:  People who have an increased body weight.  People who have connective tissue disorders.  People who use NSAID medicines.  What are the signs or symptoms? Symptoms of this condition include:  Heartburn.  Difficult or painful swallowing.  The feeling of having a lump in the throat.  Abitter taste in the mouth.  Bad breath.  Having a large amount of saliva.  Having an upset or bloated stomach.  Belching.  Chest pain.  Shortness of breath or wheezing.  Ongoing (chronic) cough or a night-time cough.  Wearing away of tooth enamel.  Weight loss.  Different conditions can cause chest pain. Make sure to see your health care provider if you experience chest pain. How is this diagnosed? Your health care provider will take a medical history and perform a physical exam. To determine if you have mild or severe  GERD, your health care provider may also monitor how you respond to treatment. You may also have other tests, including:  An endoscopy toexamine your stomach and esophagus with a small camera.  A test thatmeasures the acidity level in your esophagus.  A test thatmeasures how much pressure is on your esophagus.  A barium swallow or modified barium swallow to show the shape, size, and functioning of your esophagus.  How is this treated? The goal of treatment is to help relieve your symptoms and to prevent complications. Treatment for this condition may vary depending on how severe your symptoms are. Your health care provider may recommend:  Changes to your diet.  Medicine.  Surgery.  Follow these instructions at home: Diet  Follow a diet as recommended by your health care provider. This may involve avoiding foods and drinks such as: ? Coffee and tea (with or without caffeine). ? Drinks that containalcohol. ? Energy drinks and sports drinks. ? Carbonated drinks or sodas. ? Chocolate and cocoa. ? Peppermint and mint flavorings. ? Garlic and onions. ? Horseradish. ? Spicy and acidic foods, including peppers, chili powder, curry powder, vinegar, hot sauces, and barbecue sauce. ? Citrus fruit juices and citrus fruits, such as oranges, lemons, and limes. ? Tomato-based foods, such as red sauce, chili, salsa, and pizza with red sauce. ? Fried and fatty foods, such as donuts, french fries, potato chips, and high-fat dressings. ? High-fat meats, such as hot dogs and fatty cuts of red and white meats, such as rib eye steak, sausage, ham, and bacon. ? High-fat dairy items, such as whole milk,   butter, and cream cheese.  Eat small, frequent meals instead of large meals.  Avoid drinking large amounts of liquid with your meals.  Avoid eating meals during the 2-3 hours before bedtime.  Avoid lying down right after you eat.  Do not exercise right after you eat. General  instructions  Pay attention to any changes in your symptoms.  Take over-the-counter and prescription medicines only as told by your health care provider. Do not take aspirin, ibuprofen, or other NSAIDs unless your health care provider told you to do so.  Do not use any tobacco products, including cigarettes, chewing tobacco, and e-cigarettes. If you need help quitting, ask your health care provider.  Wear loose-fitting clothing. Do not wear anything tight around your waist that causes pressure on your abdomen.  Raise (elevate) the head of your bed 6 inches (15cm).  Try to reduce your stress, such as with yoga or meditation. If you need help reducing stress, ask your health care provider.  If you are overweight, reduce your weight to an amount that is healthy for you. Ask your health care provider for guidance about a safe weight loss goal.  Keep all follow-up visits as told by your health care provider. This is important. Contact a health care provider if:  You have new symptoms.  You have unexplained weight loss.  You have difficulty swallowing, or it hurts to swallow.  You have wheezing or a persistent cough.  Your symptoms do not improve with treatment.  You have a hoarse voice. Get help right away if:  You have pain in your arms, neck, jaw, teeth, or back.  You feel sweaty, dizzy, or light-headed.  You have chest pain or shortness of breath.  You vomit and your vomit looks like blood or coffee grounds.  You faint.  Your stool is bloody or black.  You cannot swallow, drink, or eat. This information is not intended to replace advice given to you by your health care provider. Make sure you discuss any questions you have with your health care provider. Document Released: 03/02/2005 Document Revised: 10/21/2015 Document Reviewed: 09/17/2014 Elsevier Interactive Patient Education  2018 [employer].  Hypothyroidism Hypothyroidism is a disorder of the thyroid. The  thyroid is a large gland that is located in the lower front of the neck. The thyroid releases hormones that control how the body works. With hypothyroidism, the thyroid does not make enough of these hormones. What are the causes? Causes of hypothyroidism may include:  Viral infections.  Pregnancy.  Your own defense system (immune system) attacking your thyroid.  Certain medicines.  Birth defects.  Past radiation treatments to your head or neck.  Past treatment with radioactive iodine.  Past surgical removal of part or all of your thyroid.  Problems with the gland that is located in the center of your brain (pituitary).  What are the signs or symptoms? Signs and symptoms of hypothyroidism may include:  Feeling as though you have no energy (lethargy).  Inability to tolerate cold.  Weight gain that is not explained by a change in diet or exercise habits.  Dry skin.  Coarse hair.  Menstrual irregularity.  Slowing of thought processes.  Constipation.  Sadness or depression.  How is this diagnosed? Your health care provider may diagnose hypothyroidism with blood tests and ultrasound tests. How is this treated? Hypothyroidism is treated with medicine that replaces the hormones that your body does not make. After you begin treatment, it may take several weeks for symptoms to go  away. Follow these instructions at home:  Take medicines only as directed by your health care provider.  If you start taking any new medicines, tell your health care provider.  Keep all follow-up visits as directed by your health care provider. This is important. As your condition improves, your dosage needs may change. You will need to have blood tests regularly so that your health care provider can watch your condition. Contact a health care provider if:  Your symptoms do not get better with treatment.  You are taking thyroid replacement medicine and: ? You sweat excessively. ? You have  tremors. ? You feel anxious. ? You lose weight rapidly. ? You cannot tolerate heat. ? You have emotional swings. ? You have diarrhea. ? You feel weak. Get help right away if:  You develop chest pain.  You develop an irregular heartbeat.  You develop a rapid heartbeat. This information is not intended to replace advice given to you by your health care provider. Make sure you discuss any questions you have with your health care provider. Document Released: 05/23/2005 Document Revised: 10/29/2015 Document Reviewed: 10/08/2013 Elsevier Interactive Patient Education  2018 [employer].  Exercising to [employer] Exercising can help you to lose weight. In order to lose weight through exercise, you need to do vigorous-intensity exercise. You can tell that you are exercising with vigorous intensity if you are breathing very hard and fast and cannot hold a conversation while exercising. Moderate-intensity exercise helps to maintain your current weight. You can tell that you are exercising at a moderate level if you have a higher heart rate and faster breathing, but you are still able to hold a conversation. How often should I exercise? Choose an activity that you enjoy and set realistic goals. Your health care provider can help you to make an activity plan that works for you. Exercise regularly as directed by your health care provider. This may include:  Doing resistance training twice each week, such as: ? Push-ups. ? Sit-ups. ? Lifting weights. ? Using resistance bands.  Doing a given intensity of exercise for a given amount of time. Choose from these options: ? 150 minutes of moderate-intensity exercise every week. ? 75 minutes of vigorous-intensity exercise every week. ? A mix of moderate-intensity and vigorous-intensity exercise every week.  Children, pregnant women, people who are out of shape, people who are overweight, and older adults may need to consult a health care provider for  individual recommendations. If you have any sort of medical condition, be sure to consult your health care provider before starting a new exercise program. What are some activities that can help me to lose weight?  Walking at a rate of at least 4.5 miles an hour.  Jogging or running at a rate of 5 miles per hour.  Biking at a rate of at least 10 miles per hour.  Lap swimming.  Roller-skating or in-line skating.  Cross-country skiing.  Vigorous competitive sports, such as football, basketball, and soccer.  Jumping rope.  Aerobic dancing. How can I be more active in my day-to-day activities?  Use the stairs instead of the elevator.  Take a walk during your lunch break.  If you drive, park your car farther away from work or school.  If you take public transportation, get off one stop early and walk the rest of the way.  Make all of your phone calls while standing up and walking around.  Get up, stretch, and walk around every 30 minutes throughout the  day. What guidelines should I follow while exercising?  Do not exercise so much that you hurt yourself, feel dizzy, or get very short of breath.  Consult your health care provider prior to starting a new exercise program.  Wear comfortable clothes and shoes with good support.  Drink plenty of water while you exercise to prevent dehydration or heat stroke. Body water is lost during exercise and must be replaced.  Work out until you breathe faster and your heart beats faster. This information is not intended to replace advice given to you by your health care provider. Make sure you discuss any questions you have with your health care provider. Document Released: 06/25/2010 Document Revised: 10/29/2015 Document Reviewed: 10/24/2013 Elsevier Interactive Patient Education  [employer].

## 2017-11-21 NOTE — Progress Notes (Signed)
Patient presents to clinic today to establish care.  SUBJECTIVE: PMH: Pt is a 57 yo female with pmh sig for h/o breast cancer, GERD, Thyroid problems.  Pt is followed by numerous specialists.  H/o Breast cancer: -Patient underwent double mastectomy for stage III breast cancer with reconstruction. -Her oncologist is Dr. Nicholas Lose -She is currently taking anastrozole 1 mg daily  GERD: -Pt endorses certain foods will cause her symptoms. -Currently taking Nexium as needed -In the past had symptoms more frequently such as during chemo or her last pregnancy.  Thyroid problems: -Patient was initially diagnosed after having weight gain despite increased physical activity. -Patient is currently on levothyroxine 50 mcg daily. -She had her TSH checked last week.  Patient states her OB/GYN would like to increase the dose to 100 mcg on Saturday and Sunday and 50 mcg throughout the week. -Pt is also followed by endocrinology, Dr. Michiel Sites. -Patient continues to endorse difficulty with weight.  States she works out 2 x/ wk and walks 45 minutes with her new puppy. -Patient tries to stick to a low carb diet.  Allergies: Penicillin-hives Ciprofloxacin-arm edema and redness  Past surgical history: Breast biopsy 2015 Tonsillectomy 1974 Stage III breast cancer status post double mastectomy 04/2014 and reconstruction 05/26/2015 and 07/27/2015 Procedure to remove salivary gland stone  Social history: Patient is widowed.  She has 2 children.  Patient is currently employed as a Control and instrumentation engineer.  Patient endorses social alcohol use.  Patient denies tobacco or drug use.  Family medical history: Mom-miscarriage Dad-prostate cancer, COPD, HLD Brother-Richard, kidney disease, stroke Daughter-depression, drug abuse Son-depression, drug abuse MGM-?  Cancer MGF-MI PGM-alcohol abuse, cervical cancer PGF-alcohol abuse, MI  Health Maintenance: Dental --Dr. Milus Glazier Vision  --Dr. Park Liter Immunizations --influenza vaccine 2018 Colonoscopy --would like to schedule for later this year Mammogram --March 2015 PAP --October 2018   Past Medical History:  Diagnosis Date  . Arthritis    fingers stiff  . Breast cancer ([place])    R breast- 2015  . Bronchitis 2016   treated /w IV antibiotics, admitted for 2 nights [facility]  . GERD (gastroesophageal reflux disease)   . Neuropathy    both feet since chemotherapy  . Thyroid disease   . UTI (urinary tract infection)     Past Surgical History:  Procedure Laterality Date  . BREAST RECONSTRUCTION Bilateral 04/17/2014  . BREAST RECONSTRUCTION Right 07/23/2015   Procedure: RIGHT CHEST WOUND DEBRIDEMENT, RIGHT BREAST RECONSTRUCTION WITH SILICONE GEL IMPLANT;  Surgeon: Crissie Reese, MD;  Location: [place];  Service: Plastics;  Laterality: Right;  . BREAST RECONSTRUCTION WITH PLACEMENT OF TISSUE EXPANDER AND FLEX HD (ACELLULAR HYDRATED DERMIS) Bilateral 04/17/2014   Procedure: BILATERAL BREAST RECONSTRUCTION WITH PLACEMENT OF TISSUE EXPANDER AND FLEX HD (ACELLULAR HYDRATED DERMIS);  Surgeon: Crissie Reese, MD;  Location: [place];  Service: Plastics;  Laterality: Bilateral;  . COMPLETE MASTECTOMY W/ SENTINEL NODE BIOPSY Bilateral 04/17/2014   NIPPLE SPARING RECONSTRUCTION  . LATISSIMUS FLAP TO BREAST Right 07/23/2015   Procedure: LATISSIMUS FLAP TO RIGHT CHEST;  Surgeon: Crissie Reese, MD;  Location: [place];  Service: Plastics;  Laterality: Right;  . MASTECTOMY W/ SENTINEL NODE BIOPSY Bilateral 04/17/2014   Procedure: BILATERAL NIPPLE SPARING MASTECTOMY WITH SENTINEL LYMPH NODE BIOPSY;  Surgeon: Excell Seltzer, MD;  Location: [place];  Service: General;  Laterality: Bilateral;  . PORT A CATH REVISION Right 05/27/2014   Procedure: PORT A CATH REVISION;  Surgeon: Excell Seltzer, MD;  Location: [place];  Service: General;  Laterality: Right;  . PORT-A-CATH REMOVAL Right 06/04/2015   Procedure: REMOVAL PORT-A-CATH;   Surgeon: Crissie Reese, MD;  Location: [place];  Service: Plastics;  Laterality: Right;  . PORTACATH PLACEMENT Left 05/27/2014   Procedure: INSERTION PORT-A-CATH;  Surgeon: Excell Seltzer, MD;  Location: [place];  Service: General;  Laterality: Left;  . REMOVAL OF TISSUE EXPANDER AND PLACEMENT OF IMPLANT Bilateral 06/04/2015   Procedure: REMOVAL OF BILATERAL TISSUE EXPANDER AND DELAYED BREAST RECONSTRUCTION PLACEMENT OF IMPLANTS;  Surgeon: Crissie Reese, MD;  Location: [place];  Service: Plastics;  Laterality: Bilateral;  . SALIVARY STONE REMOVAL  2009  . stricture of the uretha  1975  . TONSILLECTOMY    . TONSILLECTOMY AND ADENOIDECTOMY  1974  . URETHRAL STRICTURE DILATATION      Current Outpatient Medications on File Prior to Visit  Medication Sig Dispense Refill  . acetaminophen (TYLENOL) 500 MG tablet Take by mouth.    Marland Kitchen anastrozole (ARIMIDEX) 1 MG tablet TAKE 1 TABLET(1 MG) BY MOUTH DAILY 90 tablet 3  . esomeprazole (NEXIUM) 20 MG capsule TAKE 1 CAPSULE BY MOUTH EVERY DAY AT 12 NOON 30 capsule 2  . levothyroxine (SYNTHROID, LEVOTHROID) 50 MCG tablet TK 1 T PO QD  6  . Probiotic Product (EQ PROBIOTIC) CAPS Take by mouth.    . valACYclovir (VALTREX) 500 MG tablet Take 500 mg by mouth daily before breakfast.      No current facility-administered medications on file prior to visit.     Allergies  Allergen Reactions  . Ciprofloxacin Other (See Comments)    Arm that the iv was given in begin to swell  . Penicillins Hives and Itching    Has patient had a PCN reaction causing immediate rash, facial/tongue/throat swelling, SOB or lightheadedness with hypotension: Yes Has patient had a PCN reaction causing severe rash involving mucus membranes or skin necrosis: No Has patient had a PCN reaction that required hospitalization: No Has patient had a PCN reaction occurring within the last 10 years: No If all of the above answers are "NO", then may proceed with Cephalosporin  use.     Family History  Problem Relation Age of Onset  . Miscarriages / [place] Mother   . Cancer Father   . COPD Father   . Hyperlipidemia Father   . Cancer Maternal Grandmother   . Cancer Paternal Grandmother   . ADD / ADHD Paternal Grandmother   . Kidney disease Brother   . Stroke Brother   . Depression Daughter   . Drug abuse Daughter   . Depression Son   . Drug abuse Son   . Heart attack Maternal Grandfather   . ADD / ADHD Paternal Grandfather     Social History   Socioeconomic History  . Marital status: Widowed    Spouse name: Not on file  . Number of children: Not on file  . Years of education: Not on file  . Highest education level: Not on file  Occupational History  . Not on file  Social Needs  . Financial resource strain: Not on file  . Food insecurity:    Worry: Not on file    Inability: Not on file  . Transportation needs:    Medical: Not on file    Non-medical: Not on file  Tobacco Use  . Smoking status: Never Smoker  . Smokeless tobacco: Never Used  Substance and Sexual Activity  . Alcohol use: Yes    Comment: occ wine  .  Drug use: No  . Sexual activity: Not on file  Lifestyle  . Physical activity:    Days per week: Not on file    Minutes per session: Not on file  . Stress: Not on file  Relationships  . Social connections:    Talks on phone: Not on file    Gets together: Not on file    Attends religious service: Not on file    Active member of club or organization: Not on file    Attends meetings of clubs or organizations: Not on file    Relationship status: Not on file  . Intimate partner violence:    Fear of current or ex partner: Not on file    Emotionally abused: Not on file    Physically abused: Not on file    Forced sexual activity: Not on file  Other Topics Concern  . Not on file  Social History Narrative  . Not on file    ROS General: Denies fever, chills, night sweats, changes in weight, changes in appetite  +  weight gain HEENT: Denies headaches, ear pain, changes in vision, rhinorrhea, sore throat CV: Denies CP, palpitations, SOB, orthopnea Pulm: Denies SOB, cough, wheezing GI: Denies abdominal pain, nausea, vomiting, diarrhea, constipation GU: Denies dysuria, hematuria, frequency, vaginal discharge Msk: Denies muscle cramps, joint pains Neuro: Denies weakness, numbness, tingling Skin: Denies rashes, bruising Psych: Denies depression, anxiety, hallucinations  BP 102/80 (BP Location: Left Arm, Patient Position: Sitting, Cuff Size: Large)   Pulse 68   Temp 98.2 F (36.8 C) (Oral)   Wt 174 lb (78.9 kg)   LMP 05/28/2011   SpO2 98%   BMI 27.66 kg/m   Physical Exam Gen. Pleasant, well developed, well-nourished, in NAD HEENT - [place]/AT, PERRL, no scleral icterus, no nasal drainage, pharynx without erythema or exudate.  TMs normal bilaterally.  No cervical lymphadenopathy. Neck: No JVD, no thyromegaly, no carotid bruits Lungs: no use of accessory muscles, CTAB, no wheezes, rales or rhonchi Cardiovascular: RRR, No r/g/m, no peripheral edema Abdomen: BS present, soft, nontender, nondistended Neuro:  A&Ox3, CN II-XII intact, normal gait Skin:  Warm, dry, intact, no lesions  No results found for this or any previous visit (from the past 2160 hour(s)).  Assessment/Plan: Acquired hypothyroidism -Expressed concern over dosing instructions for levothyroxine.  Discussed would likely benefit from a more stable regular dose as opposed to increasing dose on the weekends. -Pt encouraged to discuss with endocrinology -We will defer levothyroxine to endocrinology and OB/GYN. -Pt advised TSH should be rechecked in 6 weeks dose adjusted.  Weight gain -discussed keeping a log of foods even in a day and activity. -Consider referral to nutrition  Gastroesophageal reflux disease, esophagitis presence not specified -Continue Nexium as needed -Continue to avoid foods known to cause symptoms -Given handout on  foods to avoid  Encounter to establish care -We reviewed the PMH, PSH, FH, SH, Meds and Allergies. -We provided refills for any medications we will prescribe as needed. -We addressed current concerns per orders and patient instructions. -We have asked for records for pertinent exams, studies, vaccines and notes from previous providers. -We have advised patient to follow up per instructions below.  F/u prn  Grier Mitts, MD

## 2017-12-13 ENCOUNTER — Encounter: Payer: Commercial Managed Care - PPO | Admitting: Family Medicine

## 2017-12-14 ENCOUNTER — Encounter: Payer: Self-pay | Admitting: Family Medicine

## 2017-12-14 ENCOUNTER — Ambulatory Visit (INDEPENDENT_AMBULATORY_CARE_PROVIDER_SITE_OTHER): Payer: Commercial Managed Care - PPO | Admitting: Family Medicine

## 2017-12-14 VITALS — BP 98/80 | HR 68 | Temp 98.1°F | Ht 66.0 in | Wt 179.0 lb

## 2017-12-14 DIAGNOSIS — Z23 Encounter for immunization: Secondary | ICD-10-CM | POA: Diagnosis not present

## 2017-12-14 DIAGNOSIS — Z0000 Encounter for general adult medical examination without abnormal findings: Secondary | ICD-10-CM

## 2017-12-14 DIAGNOSIS — Z13220 Encounter for screening for lipoid disorders: Secondary | ICD-10-CM | POA: Diagnosis not present

## 2017-12-14 DIAGNOSIS — R635 Abnormal weight gain: Secondary | ICD-10-CM

## 2017-12-14 LAB — LIPID PANEL
Cholesterol: 206 mg/dL — ABNORMAL HIGH (ref 0–200)
HDL: 65.3 mg/dL (ref >39.00)
LDL Cholesterol: 124 mg/dL — ABNORMAL HIGH (ref 0–99)
NONHDL: 140.28
Total CHOL/HDL Ratio: 3
Triglycerides: 83 mg/dL (ref 0.0–149.0)
VLDL: 16.6 mg/dL (ref 0.0–40.0)

## 2017-12-14 NOTE — Addendum Note (Signed)
Addended by: Wyvonne Lenz on: 12/14/2017 08:48 AM   Modules accepted: Orders

## 2017-12-14 NOTE — Progress Notes (Signed)
Subjective:     Olivia Acosta is a 57 y.o. female and is here for a comprehensive physical exam. The patient reports problems - weight gain.  Pt feels like her dose of synthroid may need to be adjusted. She gained 3 lbs in 1 month.  When she was dx'd with hypothyroidism she gained wt.  Pt is taking Synthroid 50 mcg daily taking 100 mcg on Saturday and Sunday.  Patient is followed by Dr. Michiel Sites, endocrinology.  She has an appointment in about 1 month.  Patient is still working out and trying to eat well.  Social History   Socioeconomic History  . Marital status: Widowed    Spouse name: Not on file  . Number of children: Not on file  . Years of education: Not on file  . Highest education level: Not on file  Occupational History  . Not on file  Social Needs  . Financial resource strain: Not on file  . Food insecurity:    Worry: Not on file    Inability: Not on file  . Transportation needs:    Medical: Not on file    Non-medical: Not on file  Tobacco Use  . Smoking status: Never Smoker  . Smokeless tobacco: Never Used  Substance and Sexual Activity  . Alcohol use: Yes    Comment: occ wine  . Drug use: No  . Sexual activity: Not on file  Lifestyle  . Physical activity:    Days per week: Not on file    Minutes per session: Not on file  . Stress: Not on file  Relationships  . Social connections:    Talks on phone: Not on file    Gets together: Not on file    Attends religious service: Not on file    Active member of club or organization: Not on file    Attends meetings of clubs or organizations: Not on file    Relationship status: Not on file  . Intimate partner violence:    Fear of current or ex partner: Not on file    Emotionally abused: Not on file    Physically abused: Not on file    Forced sexual activity: Not on file  Other Topics Concern  . Not on file  Social History Narrative  . Not on file   Health Maintenance  Topic Date Due  . Hepatitis C  Screening  August 28, 1960  . HIV Screening  11/13/1977  . TETANUS/TDAP  11/13/1981  . COLONOSCOPY  11/13/2012  . MAMMOGRAM  01/24/2016  . INFLUENZA VACCINE  01/04/2018  . PAP SMEAR  04/05/2020    The following portions of the patient's history were reviewed and updated as appropriate: allergies, current medications, past family history, past medical history, past social history, past surgical history and problem list.  Review of Systems A comprehensive review of systems was negative.   Objective:    BP 98/80 (BP Location: Left Arm, Patient Position: Sitting, Cuff Size: Normal)   Pulse 68   Temp 98.1 F (36.7 C) (Oral)   Ht 5\' 6"  (1.676 m)   Wt 179 lb (81.2 kg)   LMP 05/28/2011   SpO2 98%   BMI 28.89 kg/m  General appearance: alert, cooperative, appears stated age and no distress Head: Normocephalic, without obvious abnormality, atraumatic Eyes: conjunctivae/corneas clear. PERRL, EOM's intact. Fundi benign. Ears: normal TM's and external ear canals both ears Nose: Nares normal. Septum midline. Mucosa normal. No drainage or sinus tenderness. Throat: lips, mucosa, and tongue normal;  teeth and gums normal Neck: no adenopathy, no carotid bruit, no JVD, supple, symmetrical, trachea midline and thyroid not enlarged, symmetric, no tenderness/mass/nodules Lungs: clear to auscultation bilaterally Breasts: Deferred s/p double mastectomy Heart: regular rate and rhythm, S1, S2 normal, no murmur, click, rub or gallop Abdomen: soft, non-tender; bowel sounds normal; no masses,  no organomegaly Extremities: extremities normal, atraumatic, no cyanosis or edema Skin: Skin color, texture, turgor normal. No rashes or lesions Neurologic: Alert and oriented X 3, normal strength and tone. Normal symmetric reflexes. Normal coordination and gait    Assessment:    Healthy female exam with weight gain.     Plan:     Anticipatory guidance given including wearing seatbelts, smoke detectors in the home,  increasing physical activity, increasing p.o. intake of water and vegetables. -will obtain lipid panel.  Pt had recent BMP, Hgb A1C, -up to date pap 04/05/17.  Continue follow-up with OB/GYN -s/p double mastectomy and radiation for breast cancer.  Continue Anastrozole 1 mg daily -Tdap given See After Visit Summary for Counseling Recommendations    Weight gain -will wait to check TSH as pt followed by Endo -continue current dose of synthroid 50 mcg daily, but 100 mcg on Sat and Sun -continue physical activity  F/u prn  Grier Mitts, MD

## 2017-12-14 NOTE — Patient Instructions (Signed)
Preventive Care 40-64 Years, Female Preventive care refers to lifestyle choices and visits with your health care provider that can promote health and wellness. What does preventive care include?  A yearly physical exam. This is also called an annual well check.  Dental exams once or twice a year.  Routine eye exams. Ask your health care provider how often you should have your eyes checked.  Personal lifestyle choices, including: ? Daily care of your teeth and gums. ? Regular physical activity. ? Eating a healthy diet. ? Avoiding tobacco and drug use. ? Limiting alcohol use. ? Practicing safe sex. ? Taking low-dose aspirin daily starting at age 58. ? Taking vitamin and mineral supplements as recommended by your health care provider. What happens during an annual well check? The services and screenings done by your health care provider during your annual well check will depend on your age, overall health, lifestyle risk factors, and family history of disease. Counseling Your health care provider may ask you questions about your:  Alcohol use.  Tobacco use.  Drug use.  Emotional well-being.  Home and relationship well-being.  Sexual activity.  Eating habits.  Work and work Statistician.  Method of birth control.  Menstrual cycle.  Pregnancy history.  Screening You may have the following tests or measurements:  Height, weight, and BMI.  Blood pressure.  Lipid and cholesterol levels. These may be checked every 5 years, or more frequently if you are over 81 years old.  Skin check.  Lung cancer screening. You may have this screening every year starting at age 78 if you have a 30-pack-year history of smoking and currently smoke or have quit within the past 15 years.  Fecal occult blood test (FOBT) of the stool. You may have this test every year starting at age 65.  Flexible sigmoidoscopy or colonoscopy. You may have a sigmoidoscopy every 5 years or a colonoscopy  every 10 years starting at age 30.  Hepatitis C blood test.  Hepatitis B blood test.  Sexually transmitted disease (STD) testing.  Diabetes screening. This is done by checking your blood sugar (glucose) after you have not eaten for a while (fasting). You may have this done every 1-3 years.  Mammogram. This may be done every 1-2 years. Talk to your health care provider about when you should start having regular mammograms. This may depend on whether you have a family history of breast cancer.  BRCA-related cancer screening. This may be done if you have a family history of breast, ovarian, tubal, or peritoneal cancers.  Pelvic exam and Pap test. This may be done every 3 years starting at age 80. Starting at age 36, this may be done every 5 years if you have a Pap test in combination with an HPV test.  Bone density scan. This is done to screen for osteoporosis. You may have this scan if you are at high risk for osteoporosis.  Discuss your test results, treatment options, and if necessary, the need for more tests with your health care provider. Vaccines Your health care provider may recommend certain vaccines, such as:  Influenza vaccine. This is recommended every year.  Tetanus, diphtheria, and acellular pertussis (Tdap, Td) vaccine. You may need a Td booster every 10 years.  Varicella vaccine. You may need this if you have not been vaccinated.  Zoster vaccine. You may need this after age 5.  Measles, mumps, and rubella (MMR) vaccine. You may need at least one dose of MMR if you were born in  1955 or later. You may also need a second dose.  Pneumococcal 13-valent conjugate (PCV13) vaccine. You may need this if you have certain conditions and were not previously vaccinated.  Pneumococcal polysaccharide (PPSV23) vaccine. You may need one or two doses if you smoke cigarettes or if you have certain conditions.  Meningococcal vaccine. You may need this if you have certain  conditions.  Hepatitis A vaccine. You may need this if you have certain conditions or if you travel or work in places where you may be exposed to hepatitis A.  Hepatitis B vaccine. You may need this if you have certain conditions or if you travel or work in places where you may be exposed to hepatitis B.  Haemophilus influenzae type b (Hib) vaccine. You may need this if you have certain conditions.  Talk to your health care provider about which screenings and vaccines you need and how often you need them. This information is not intended to replace advice given to you by your health care provider. Make sure you discuss any questions you have with your health care provider. Document Released: 06/19/2015 Document Revised: 02/10/2016 Document Reviewed: 03/24/2015 Elsevier Interactive Patient Education  2018 Elsevier Inc.  

## 2017-12-18 ENCOUNTER — Telehealth: Payer: Self-pay | Admitting: Family Medicine

## 2017-12-18 NOTE — Telephone Encounter (Signed)
Left message to call back on voice mail

## 2017-12-18 NOTE — Telephone Encounter (Signed)
Pt checking on the status of form pt left to be filled out. It is called [employer] Care Form".

## 2017-12-18 NOTE — Telephone Encounter (Signed)
Copied from [place] [id number]. Topic: Quick Communication - Lab Results >> Dec 15, 2017  2:49 PM Agnes Lawrence, CMA wrote: Called patient to inform them of lab results. When patient returns call, triage nurse may disclose results.  Please contact patient

## 2017-12-25 ENCOUNTER — Telehealth: Payer: Self-pay | Admitting: Family Medicine

## 2017-12-25 NOTE — Telephone Encounter (Signed)
This provider does not have a form.

## 2017-12-25 NOTE — Telephone Encounter (Signed)
Spoke with pt,stated that she would faxes the copy of the form again for completion, office number provided

## 2017-12-25 NOTE — Telephone Encounter (Signed)
Please Advise

## 2017-12-25 NOTE — Telephone Encounter (Signed)
Copied from [place] [id number]. Topic: General - Other >> Dec 25, 2017 10:39 AM Yvette Rack wrote: Reason for CRM: Pt returned call to office. Pt states she gave [place] Routine Preventative Care form to [place] on 12/14/17 visit to be completed for her job and she would like it completed. Pt requests call back. Cb# [phone]

## 2017-12-25 NOTE — Telephone Encounter (Signed)
We have not received a form for pt, called left a message for pt to return my call

## 2018-01-01 ENCOUNTER — Telehealth: Payer: Self-pay | Admitting: Family Medicine

## 2018-01-01 NOTE — Telephone Encounter (Signed)
Pt dropped off a physical form to be filled out and upon completion she would like to be called at [phone] to pick it up.  Put form in providers folder.

## 2018-01-11 NOTE — Telephone Encounter (Signed)
Form complete and is ready for pick up at the front office, left message on pt voicemail.

## 2018-01-12 NOTE — Telephone Encounter (Signed)
Pt picked up form and there was "NO CHARGE"

## 2018-01-15 DIAGNOSIS — E039 Hypothyroidism, unspecified: Secondary | ICD-10-CM | POA: Diagnosis not present

## 2018-01-29 DIAGNOSIS — E039 Hypothyroidism, unspecified: Secondary | ICD-10-CM | POA: Diagnosis not present

## 2018-01-29 DIAGNOSIS — Z853 Personal history of malignant neoplasm of breast: Secondary | ICD-10-CM | POA: Diagnosis not present

## 2018-01-29 DIAGNOSIS — N951 Menopausal and female climacteric states: Secondary | ICD-10-CM | POA: Diagnosis not present

## 2018-05-23 DIAGNOSIS — Z01419 Encounter for gynecological examination (general) (routine) without abnormal findings: Secondary | ICD-10-CM | POA: Diagnosis not present

## 2018-05-23 DIAGNOSIS — Z6829 Body mass index (BMI) 29.0-29.9, adult: Secondary | ICD-10-CM | POA: Diagnosis not present

## 2018-06-15 DIAGNOSIS — E039 Hypothyroidism, unspecified: Secondary | ICD-10-CM | POA: Diagnosis not present

## 2018-06-15 DIAGNOSIS — E063 Autoimmune thyroiditis: Secondary | ICD-10-CM | POA: Diagnosis not present

## 2018-06-15 DIAGNOSIS — N951 Menopausal and female climacteric states: Secondary | ICD-10-CM | POA: Diagnosis not present

## 2018-07-19 ENCOUNTER — Telehealth (HOSPITAL_COMMUNITY): Payer: Self-pay | Admitting: Licensed Clinical Social Worker

## 2018-07-19 NOTE — Telephone Encounter (Signed)
Spoke w/ PT regarding her 58yo daughter who struggles w/ Substance Abuse issues, Anxiety, Depression. PT wanted resources and recommendations for tx in the area. Advised PT to seek residential tx for her daughter at this time and reach out for Outpatient services after her daughter attains 30-60 days of sobriety.

## 2018-10-31 ENCOUNTER — Telehealth: Payer: Self-pay | Admitting: Hematology and Oncology

## 2018-10-31 NOTE — Telephone Encounter (Signed)
Called patient regarding upcoming Webex appointment, patient is notified and e-mail has been sent. °

## 2018-10-31 NOTE — Telephone Encounter (Signed)
[name] 6/3 moved f/u to AM. Conformed with patient.

## 2018-10-31 NOTE — Assessment & Plan Note (Signed)
Right breast invasive lobular cancer 5.5 cm multifocal disease with 6/12 lymph nodes positive T3, N2, M0 stage IIIa, ER 92%, PR 98%, HER-2 negative ratio 1.13, Ki-67 10% status post bilateral mastectomies on 04/17/2014, status post adjuvant chemotherapy started 06/12/2014 and completed 10/23/2014, started radiation June 2016 completed August 2016. Started anastrozole 02/02/2015 5 years  Anastrozole toxicities: Denies any hot flashes or myalgias. Tolerating it extremely well. She does have some muscle stiffness and achiness in her hands but it gets better with activity.  Breast reconstruction with latissimus dorsi flap done in December 2016 and February 2017.  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 

## 2018-11-06 NOTE — Progress Notes (Signed)
HEMATOLOGY-ONCOLOGY [place] VISIT PROGRESS NOTE  I connected with Menucha Dicesare on 11/07/2018 at 11:15 AM EDT by Webex video conference and verified that I am speaking with the correct person using two identifiers.  I discussed the limitations, risks, security and privacy concerns of performing an evaluation and management service by Webex and the availability of in person appointments.  I also discussed with the patient that there may be a patient responsible charge related to this service. The patient expressed understanding and agreed to proceed.   Patient's Location: Home Physician Location: Clinic  CHIEF COMPLIANT: Follow-up on anastrozole therapy  INTERVAL HISTORY: Olivia Acosta is a 58 y.o. female with above-mentioned history of right breast cancer treated with bilateral mastectomies followed by adjuvant chemotherapy and radiation and is currently on anastrozole. I last saw her a year ago. She presents today over Webex for annual follow-up.  She was diagnosed with Hashimotos thyroiditis. Still hasnt lost weight.    Breast cancer of lower-outer quadrant of right female breast ([place])   01/31/2014 Breast MRI    Right breast mass 3.3 x 2.8 x 3.4 cm with several foci of subcentimeter enhancing nodules extending anteriorly to worsen nipple up to 5.4 cm anterior to the mass     04/17/2014 Surgery    Bilateral mastectomies, left: Benign, right breast multifocal ILC grade 2 largest 5.5 cm, posterior margin focally positive, 2 SLN's positive with extracapsular extension + 4/10 axillary LN positive, ER 92%, PR 98%, HER-2 -1.1.3, Ki 67 10%    06/12/2014 - 10/23/2014 Chemotherapy    Adriamycin and Cytoxan dose dense every 2 weeks x4 followed by Taxol x12    09/14/2014 - 09/16/2014 Hospital Admission    Neutropenic fever    12/09/2014 - 01/20/2015 Radiation Therapy    Adjuvant radiation therapy    02/02/2015 -  Anti-estrogen oral therapy    Anastrozole 1 mg daily X 5 years      REVIEW OF SYSTEMS:   Constitutional: Denies fevers, chills or abnormal weight loss Eyes: Denies blurriness of vision Ears, nose, mouth, throat, and face: Denies mucositis or sore throat Respiratory: Denies cough, dyspnea or wheezes Cardiovascular: Denies palpitation, chest discomfort Gastrointestinal:  Denies nausea, heartburn or change in bowel habits Skin: Denies abnormal skin rashes Lymphatics: Denies new lymphadenopathy or easy bruising Neurological:Denies numbness, tingling or new weaknesses Behavioral/Psych: Mood is stable, no new changes  Extremities: No lower extremity edema Breast: denies any pain or lumps or nodules in either breasts All other systems were reviewed with the patient and are negative.  Observations/Objective:  There were no vitals filed for this visit. There is no height or weight on file to calculate BMI.  I have reviewed the data as listed CMP Latest Ref Rng & Units 07/23/2015 06/05/2015 05/26/2015  Glucose 65 - 99 mg/dL 89 105(H) 100(H)  BUN 6 - 20 mg/dL 13 5(L) 12  Creatinine 0.44 - 1.00 mg/dL 0.93 0.74 0.76  Sodium 135 - 145 mmol/L 139 142 141  Potassium 3.5 - 5.1 mmol/L 4.3 3.9 4.1  Chloride 101 - 111 mmol/L 102 106 105  CO2 22 - 32 mmol/L '24 26 25  '$ Calcium 8.9 - 10.3 mg/dL 9.5 8.9 9.7  Total Protein 6.5 - 8.1 g/dL - - -  Total Bilirubin 0.3 - 1.2 mg/dL - - -  Alkaline Phos 38 - 126 U/L - - -  AST 15 - 41 U/L - - -  ALT 14 - 54 U/L - - -    Lab Results  Component Value Date   WBC 4.8 07/23/2015   HGB 11.9 (L) 07/23/2015   HCT 36.6 07/23/2015   MCV 90.8 07/23/2015   PLT 291 07/23/2015   NEUTROABS 3.0 11/03/2014    Assessment Plan:  Breast cancer of lower-outer quadrant of right female breast Right breast invasive lobular cancer 5.5 cm multifocal disease with 6/12 lymph nodes positive T3, N2, M0 stage IIIa, ER 92%, PR 98%, HER-2 negative ratio 1.13, Ki-67 10% status post bilateral mastectomies on 04/17/2014, status post adjuvant chemotherapy  started 06/12/2014 and completed 10/23/2014, started radiation June 2016 completed August 2016. Started anastrozole 02/02/2015 7 years  Anastrozole toxicities: Denies any hot flashes or myalgias. Tolerating it extremely well. She does have some muscle stiffness and achiness in her hands but it gets better with activity.  Breast reconstruction with latissimus dorsi flap done in December 2016 and February 2017.  Bone density 03/24/2015 (Dr.Grewall) T score -0.8 normal Patient is currently working for HR  Taking Zinc, B 12 and Vit D, probiotic  Weight Gain: Seeing [facility] (sees Raoul Pitch) Return to clinic in 1 year for follow-up  I discussed the assessment and treatment plan with the patient. The patient was provided an opportunity to ask questions and all were answered. The patient agreed with the plan and demonstrated an understanding of the instructions. The patient was advised to call back or seek an in-person evaluation if the symptoms worsen or if the condition fails to improve as anticipated.   I provided 15 minutes of face-to-face Web Ex time during this encounter.    Rulon Eisenmenger, MD 11/07/2018   I, Molly Dorshimer, am acting as scribe for Nicholas Lose, MD.  I have reviewed the above documentation for accuracy and completeness, and I agree with the above.

## 2018-11-07 ENCOUNTER — Inpatient Hospital Stay: Payer: Commercial Managed Care - PPO | Attending: Hematology and Oncology | Admitting: Hematology and Oncology

## 2018-11-07 DIAGNOSIS — C50511 Malignant neoplasm of lower-outer quadrant of right female breast: Secondary | ICD-10-CM

## 2018-11-07 DIAGNOSIS — Z9221 Personal history of antineoplastic chemotherapy: Secondary | ICD-10-CM

## 2018-11-07 DIAGNOSIS — Z79811 Long term (current) use of aromatase inhibitors: Secondary | ICD-10-CM

## 2018-11-07 DIAGNOSIS — Z170 Estrogen receptor positive status [ER+]: Secondary | ICD-10-CM

## 2018-11-07 DIAGNOSIS — Z9013 Acquired absence of bilateral breasts and nipples: Secondary | ICD-10-CM | POA: Diagnosis not present

## 2018-11-07 DIAGNOSIS — Z923 Personal history of irradiation: Secondary | ICD-10-CM

## 2018-11-07 MED ORDER — ANASTROZOLE 1 MG PO TABS: ORAL_TABLET | ORAL | 3 refills | Status: DC

## 2019-01-02 ENCOUNTER — Telehealth: Payer: Self-pay

## 2019-01-02 NOTE — Telephone Encounter (Signed)
Dr. Banks please advise  

## 2019-01-02 NOTE — Telephone Encounter (Signed)
Copied from [place] [id number]. Topic: Referral - Request for Referral >> Jan 02, 2019  8:40 AM Burchel, Abbi R wrote: Has patient seen PCP for this complaint? no *If NO, is insurance requiring patient see PCP for this issue before PCP can refer them? Referral for which specialty: Psych Preferred provider/office: Pt would prefer someone at [facility] Reason for referral: Pt is having some added stress due to family issues and she would like to speak with a therapist/counselor.  Pt:  [phone]

## 2019-01-04 NOTE — Telephone Encounter (Signed)
Ok to place referral if needed.

## 2019-01-08 ENCOUNTER — Other Ambulatory Visit: Payer: Self-pay | Admitting: *Deleted

## 2019-01-08 DIAGNOSIS — F439 Reaction to severe stress, unspecified: Secondary | ICD-10-CM

## 2019-01-08 NOTE — Telephone Encounter (Signed)
Referral places to psychology ([facility])

## 2019-01-10 ENCOUNTER — Ambulatory Visit (INDEPENDENT_AMBULATORY_CARE_PROVIDER_SITE_OTHER): Payer: Commercial Managed Care - PPO | Admitting: Psychology

## 2019-01-10 DIAGNOSIS — F4323 Adjustment disorder with mixed anxiety and depressed mood: Secondary | ICD-10-CM

## 2019-01-24 ENCOUNTER — Ambulatory Visit (INDEPENDENT_AMBULATORY_CARE_PROVIDER_SITE_OTHER): Payer: Commercial Managed Care - PPO | Admitting: Psychology

## 2019-01-24 DIAGNOSIS — F4323 Adjustment disorder with mixed anxiety and depressed mood: Secondary | ICD-10-CM | POA: Diagnosis not present

## 2019-02-07 ENCOUNTER — Ambulatory Visit (INDEPENDENT_AMBULATORY_CARE_PROVIDER_SITE_OTHER): Payer: Commercial Managed Care - PPO | Admitting: Psychology

## 2019-02-07 DIAGNOSIS — F4323 Adjustment disorder with mixed anxiety and depressed mood: Secondary | ICD-10-CM | POA: Diagnosis not present

## 2019-02-22 ENCOUNTER — Ambulatory Visit (INDEPENDENT_AMBULATORY_CARE_PROVIDER_SITE_OTHER): Payer: Commercial Managed Care - PPO | Admitting: Psychology

## 2019-02-22 DIAGNOSIS — F4323 Adjustment disorder with mixed anxiety and depressed mood: Secondary | ICD-10-CM | POA: Diagnosis not present

## 2019-03-07 ENCOUNTER — Ambulatory Visit (INDEPENDENT_AMBULATORY_CARE_PROVIDER_SITE_OTHER): Payer: Commercial Managed Care - PPO | Admitting: Psychology

## 2019-03-07 DIAGNOSIS — F4323 Adjustment disorder with mixed anxiety and depressed mood: Secondary | ICD-10-CM

## 2019-03-21 ENCOUNTER — Ambulatory Visit (INDEPENDENT_AMBULATORY_CARE_PROVIDER_SITE_OTHER): Payer: Commercial Managed Care - PPO | Admitting: Psychology

## 2019-03-21 DIAGNOSIS — F4323 Adjustment disorder with mixed anxiety and depressed mood: Secondary | ICD-10-CM | POA: Diagnosis not present

## 2019-04-11 ENCOUNTER — Ambulatory Visit (INDEPENDENT_AMBULATORY_CARE_PROVIDER_SITE_OTHER): Payer: Commercial Managed Care - PPO | Admitting: Psychology

## 2019-04-11 DIAGNOSIS — F4323 Adjustment disorder with mixed anxiety and depressed mood: Secondary | ICD-10-CM

## 2019-04-25 ENCOUNTER — Ambulatory Visit (INDEPENDENT_AMBULATORY_CARE_PROVIDER_SITE_OTHER): Payer: Commercial Managed Care - PPO | Admitting: Psychology

## 2019-04-25 DIAGNOSIS — F4323 Adjustment disorder with mixed anxiety and depressed mood: Secondary | ICD-10-CM

## 2019-05-09 ENCOUNTER — Ambulatory Visit (INDEPENDENT_AMBULATORY_CARE_PROVIDER_SITE_OTHER): Payer: Commercial Managed Care - PPO | Admitting: Psychology

## 2019-05-09 DIAGNOSIS — F4323 Adjustment disorder with mixed anxiety and depressed mood: Secondary | ICD-10-CM

## 2019-05-21 ENCOUNTER — Encounter: Payer: Self-pay | Admitting: Family Medicine

## 2019-05-22 ENCOUNTER — Other Ambulatory Visit: Payer: Self-pay | Admitting: Family Medicine

## 2019-05-22 DIAGNOSIS — Z8639 Personal history of other endocrine, nutritional and metabolic disease: Secondary | ICD-10-CM

## 2019-05-23 ENCOUNTER — Ambulatory Visit (INDEPENDENT_AMBULATORY_CARE_PROVIDER_SITE_OTHER): Payer: Commercial Managed Care - PPO | Admitting: Psychology

## 2019-05-23 DIAGNOSIS — F4323 Adjustment disorder with mixed anxiety and depressed mood: Secondary | ICD-10-CM | POA: Diagnosis not present

## 2019-06-26 ENCOUNTER — Other Ambulatory Visit: Payer: Self-pay

## 2019-06-27 ENCOUNTER — Ambulatory Visit (INDEPENDENT_AMBULATORY_CARE_PROVIDER_SITE_OTHER): Payer: Commercial Managed Care - PPO | Admitting: Psychology

## 2019-06-27 DIAGNOSIS — F4323 Adjustment disorder with mixed anxiety and depressed mood: Secondary | ICD-10-CM | POA: Diagnosis not present

## 2019-06-27 NOTE — Progress Notes (Signed)
Name: Olivia Acosta  MRN/ DOB: [id number], April 04, 1961    Age/ Sex: 59 y.o., female    PCP: Billie Ruddy, MD   Reason for Endocrinology Evaluation: Elevated Anti-TPO Ab     Date of Initial Endocrinology Evaluation: 06/27/2019     HPI: Olivia Acosta is a 59 y.o. female with a past medical history of Hypothyroidism and hx of breast ca. The patient presented for initial endocrinology clinic visit on 06/27/2019 for consultative assistance with her elevated anti-Tpo Ab's.    Olivia Acosta follows with [facility] , during her extensive lab workup Olivia Acosta was noted to have elevated Tg antibody (16.6 IU/mL reference 0.0-0.9)  and anti TPO ( 51 IU reference 0-34) but with normal TFT's  A couple of months  ago Olivia Acosta noted dysphagia to magnesium pills at night, despite drinking plenty of eater , associated with dry cough at night . Olivia Acosta attributes this to a thyroid nodule. Has occasional runny sinuses, takes Claritin occassionally .    Has been exposed to radiation to the breast  Followed with elevated TSH, Olivia Acosta started seeing Dr. Chalmers Cater, in 2018  was started on LT-4 replacement but did not lose weight.   Olivia Acosta reads a lot of online things about hashimoto's  And is following food protocol book . Has given up gluten and soy in addition to other things.  Has occasional heart burn issues.     Maternal aunt with thyroid disease    HISTORY:  Past Medical History:  Past Medical History:  Diagnosis Date  . Arthritis    fingers stiff  . Breast cancer ([place])    R breast- 2015  . Bronchitis 2016   treated /w IV antibiotics, admitted for 2 nights [facility]  . GERD (gastroesophageal reflux disease)   . Neuropathy    both feet since chemotherapy  . Thyroid disease   . UTI (urinary tract infection)     Past Surgical History:  Past Surgical History:  Procedure Laterality Date  . BREAST RECONSTRUCTION Bilateral 04/17/2014  . BREAST RECONSTRUCTION Right 07/23/2015   Procedure:  RIGHT CHEST WOUND DEBRIDEMENT, RIGHT BREAST RECONSTRUCTION WITH SILICONE GEL IMPLANT;  Surgeon: Crissie Reese, MD;  Location: [place];  Service: Plastics;  Laterality: Right;  . BREAST RECONSTRUCTION WITH PLACEMENT OF TISSUE EXPANDER AND FLEX HD (ACELLULAR HYDRATED DERMIS) Bilateral 04/17/2014   Procedure: BILATERAL BREAST RECONSTRUCTION WITH PLACEMENT OF TISSUE EXPANDER AND FLEX HD (ACELLULAR HYDRATED DERMIS);  Surgeon: Crissie Reese, MD;  Location: [place];  Service: Plastics;  Laterality: Bilateral;  . COMPLETE MASTECTOMY W/ SENTINEL NODE BIOPSY Bilateral 04/17/2014   NIPPLE SPARING RECONSTRUCTION  . LATISSIMUS FLAP TO BREAST Right 07/23/2015   Procedure: LATISSIMUS FLAP TO RIGHT CHEST;  Surgeon: Crissie Reese, MD;  Location: [place];  Service: Plastics;  Laterality: Right;  . MASTECTOMY W/ SENTINEL NODE BIOPSY Bilateral 04/17/2014   Procedure: BILATERAL NIPPLE SPARING MASTECTOMY WITH SENTINEL LYMPH NODE BIOPSY;  Surgeon: Excell Seltzer, MD;  Location: [facility];  Service: General;  Laterality: Bilateral;  . PORT A CATH REVISION Right 05/27/2014   Procedure: PORT A CATH REVISION;  Surgeon: Excell Seltzer, MD;  Location: [place];  Service: General;  Laterality: Right;  . PORT-A-CATH REMOVAL Right 06/04/2015   Procedure: REMOVAL PORT-A-CATH;  Surgeon: Crissie Reese, MD;  Location: [facility];  Service: Plastics;  Laterality: Right;  . PORTACATH PLACEMENT Left 05/27/2014   Procedure: INSERTION PORT-A-CATH;  Surgeon: Excell Seltzer, MD;  Location: [facility];  Service: General;  Laterality: Left;  . REMOVAL OF TISSUE EXPANDER AND PLACEMENT OF IMPLANT Bilateral 06/04/2015   Procedure: REMOVAL OF BILATERAL TISSUE EXPANDER AND DELAYED BREAST RECONSTRUCTION PLACEMENT OF IMPLANTS;  Surgeon: Crissie Reese, MD;  Location: [place];  Service: Plastics;  Laterality: Bilateral;  . SALIVARY STONE REMOVAL  2009  . stricture of the uretha  1975  . TONSILLECTOMY    . TONSILLECTOMY AND  ADENOIDECTOMY  1974  . URETHRAL STRICTURE DILATATION        Social History:  reports that Olivia Acosta has never smoked. Olivia Acosta has never used smokeless tobacco. Olivia Acosta reports current alcohol use. Olivia Acosta reports that Olivia Acosta does not use drugs.  Family History: family history includes ADD / ADHD in her paternal grandfather and paternal grandmother; COPD in her father; Cancer in her father, maternal grandmother, and paternal grandmother; Depression in her daughter and son; Drug abuse in her daughter and son; Heart attack in her maternal grandfather; Hyperlipidemia in her father; Kidney disease in her brother; Miscarriages / [place] in her mother; Stroke in her brother.   HOME MEDICATIONS: Allergies as of 06/28/2019      Reactions   Ciprofloxacin Other (See Comments)   Arm that the iv was given in begin to swell   Penicillins Hives, Itching   Has patient had a PCN reaction causing immediate rash, facial/tongue/throat swelling, SOB or lightheadedness with hypotension: Yes Has patient had a PCN reaction causing severe rash involving mucus membranes or skin necrosis: No Has patient had a PCN reaction that required hospitalization: No Has patient had a PCN reaction occurring within the last 10 years: No If all of the above answers are "NO", then may proceed with Cephalosporin use.      Medication List       Accurate as of June 27, 2019 12:01 PM. If you have any questions, ask your nurse or doctor.        acetaminophen 500 MG tablet Commonly known as: TYLENOL Take by mouth.   anastrozole 1 MG tablet Commonly known as: ARIMIDEX TAKE 1 TABLET(1 MG) BY MOUTH DAILY   EQ Probiotic Caps Take by mouth.   esomeprazole 20 MG capsule Commonly known as: NEXIUM TAKE 1 CAPSULE BY MOUTH EVERY DAY AT 12 NOON   levothyroxine 50 MCG tablet Commonly known as: SYNTHROID TK 1 T PO QD   valACYclovir 500 MG tablet Commonly known as: VALTREX Take 500 mg by mouth daily before breakfast.         REVIEW OF  SYSTEMS: A comprehensive ROS was conducted with the patient and is negative except as per HPI and below:  ROS     OBJECTIVE:  VS: LMP 05/28/2011    Wt Readings from Last 3 Encounters:  12/14/17 179 lb (81.2 kg)  11/21/17 174 lb (78.9 kg)  11/06/17 179 lb 1.6 oz (81.2 kg)     EXAM: General: Olivia Acosta appears well and is in NAD  Eyes: External eye exam normal without stare, lid lag or exophthalmos.  EOM intact.    Neck: General: Supple without adenopathy. Thyroid: Thyroid size normal.  No goiter or nodules appreciated. No thyroid bruit.  Lungs: Clear with good BS bilat with no rales, rhonchi, or wheezes  Heart: Auscultation: RRR.  Abdomen: Normoactive bowel sounds, soft, nontender, without masses or organomegaly palpable  Extremities:  BL LE: No pretibial edema normal ROM and strength.  Skin: Hair: Texture and amount normal with gender appropriate distribution Skin Inspection: No rashes Skin Palpation: Skin temperature, texture, and thickness normal to palpation  Neuro: Cranial nerves: II - XII grossly intact  Motor: Normal strength throughout DTRs: 2+ and symmetric in UE without delay in relaxation phase  Mental Status: Judgment, insight: Intact Orientation: Oriented to time, place, and person  Mood and affect: No depression, anxiety, or agitation     DATA REVIEWED:   01/29/2018 FT4 1.38 ng/dL (0.82-1.77) FT3 2.8 pg/mL  TSH 3.060 uIU/mL  Tg antibody (16.6 IU/mL reference 0.0-0.9)  Anti TPO ( 51 IU reference 0-34)     02/28/2019  Tg Ab 4.7 IU (0.0-0.9) Anti-TPO < 9 IU /mL  ASSESSMENT/PLAN/RECOMMENDATIONS:   1. Hx of elevated Anti-TPO Ab's  - Olivia Acosta has been on LT- 4 replacement since 2018. TSH has been normal through labs at [facility] - Olivia Acosta has local neck symptoms - will proceed with ultrasound , I did explain to her that the symptoms of dysphagia at night, dry cough is most consistent with GERD or postnasal drip rather then thyroid  Nodules per se.    - Olivia Acosta educated extensively on the correct way to take levothyroxine (first thing in the morning with water, 30 minutes before eating or taking other medications). - Olivia Acosta encouraged to double dose the following day if Olivia Acosta were to miss a dose given long half-life of levothyroxine.  Medications : Continue Levothyroxine 50 mcg daily     F/U PRN - pending ultrasound results  Signed electronically by: Mack Guise, MD  [facility]  [facility] [address] [place], [place] [address] Phone: [phone] FAX: [phone]   CC: Billie Ruddy, [place] [address] Phone: [phone] Fax: [phone]   Return to Endocrinology clinic as below: Future Appointments  Date Time Provider [place]  06/28/2019  9:50 AM Azelia Reiger, Melanie Crazier, MD [facility] None  11/18/2019  9:15 AM Nicholas Lose, MD [facility] None

## 2019-06-28 ENCOUNTER — Ambulatory Visit: Payer: Commercial Managed Care - PPO | Admitting: Internal Medicine

## 2019-06-28 ENCOUNTER — Other Ambulatory Visit: Payer: Self-pay

## 2019-06-28 ENCOUNTER — Encounter: Payer: Self-pay | Admitting: Internal Medicine

## 2019-06-28 VITALS — BP 104/68 | HR 75 | Temp 98.4°F | Ht 66.0 in | Wt 188.0 lb

## 2019-06-28 DIAGNOSIS — E039 Hypothyroidism, unspecified: Secondary | ICD-10-CM

## 2019-06-28 NOTE — Patient Instructions (Addendum)
You are on levothyroxine - which is your thyroid hormone supplement. You MUST take this consistently.  You should take this first thing in the morning on an empty stomach with water. You should not take it with other medications. Wait 80min to 1hr prior to eating. If you are taking any vitamins - please take these in the evening.   If you miss a dose, please take your missed dose the following day (double the dose for that day). You should have a pill box for ONLY levothyroxine on your bedside table to help you remember to take your medications.   - We will set you up for a thyroid ultrasound

## 2019-07-11 ENCOUNTER — Ambulatory Visit (INDEPENDENT_AMBULATORY_CARE_PROVIDER_SITE_OTHER): Payer: Commercial Managed Care - PPO | Admitting: Psychology

## 2019-07-11 DIAGNOSIS — F4323 Adjustment disorder with mixed anxiety and depressed mood: Secondary | ICD-10-CM

## 2019-08-08 ENCOUNTER — Ambulatory Visit (INDEPENDENT_AMBULATORY_CARE_PROVIDER_SITE_OTHER): Payer: Commercial Managed Care - PPO | Admitting: Psychology

## 2019-08-08 DIAGNOSIS — F4323 Adjustment disorder with mixed anxiety and depressed mood: Secondary | ICD-10-CM

## 2019-08-09 ENCOUNTER — Encounter: Payer: Self-pay | Admitting: Hematology and Oncology

## 2019-08-19 ENCOUNTER — Other Ambulatory Visit: Payer: Commercial Managed Care - PPO

## 2019-09-05 ENCOUNTER — Ambulatory Visit (INDEPENDENT_AMBULATORY_CARE_PROVIDER_SITE_OTHER): Payer: Commercial Managed Care - PPO | Admitting: Psychology

## 2019-09-05 DIAGNOSIS — F4323 Adjustment disorder with mixed anxiety and depressed mood: Secondary | ICD-10-CM | POA: Diagnosis not present

## 2019-10-03 ENCOUNTER — Ambulatory Visit (INDEPENDENT_AMBULATORY_CARE_PROVIDER_SITE_OTHER): Payer: Commercial Managed Care - PPO | Admitting: Psychology

## 2019-10-03 DIAGNOSIS — F4323 Adjustment disorder with mixed anxiety and depressed mood: Secondary | ICD-10-CM

## 2019-10-31 ENCOUNTER — Ambulatory Visit (INDEPENDENT_AMBULATORY_CARE_PROVIDER_SITE_OTHER): Payer: Commercial Managed Care - PPO | Admitting: Psychology

## 2019-10-31 DIAGNOSIS — F4323 Adjustment disorder with mixed anxiety and depressed mood: Secondary | ICD-10-CM | POA: Diagnosis not present

## 2019-11-14 NOTE — Progress Notes (Signed)
Patient Care Team: Billie Ruddy, MD as PCP - General (Family Medicine)  DIAGNOSIS:    ICD-10-CM   1. Malignant neoplasm of lower-outer quadrant of right breast of female, estrogen receptor positive ([place])  C50.511    Z17.0     SUMMARY OF ONCOLOGIC HISTORY: Oncology History  Breast cancer of lower-outer quadrant of right female breast ([place])  01/31/2014 Breast MRI   Right breast mass 3.3 x 2.8 x 3.4 cm with several foci of subcentimeter enhancing nodules extending anteriorly to worsen nipple up to 5.4 cm anterior to the mass    04/17/2014 Surgery   Bilateral mastectomies, left: Benign, right breast multifocal ILC grade 2 largest 5.5 cm, posterior margin focally positive, 2 SLN's positive with extracapsular extension + 4/10 axillary LN positive, ER 92%, PR 98%, HER-2 -1.1.3, Ki 67 10%   06/12/2014 - 10/23/2014 Chemotherapy   Adriamycin and Cytoxan dose dense every 2 weeks x4 followed by Taxol x12   09/14/2014 - 09/16/2014 Hospital Admission   Neutropenic fever   12/09/2014 - 01/20/2015 Radiation Therapy   Adjuvant radiation therapy   02/02/2015 -  Anti-estrogen oral therapy   Anastrozole 1 mg daily X 5 years     CHIEF COMPLIANT: Follow-up of right breast cancer on anastrozole therapy  INTERVAL HISTORY: Olivia Acosta is a 59 y.o. with above-mentioned history of right breast cancer treated with bilateral mastectomies, adjuvant chemotherapy, radiation, and who is currently on anastrozole. She presents to the clinic today for annual follow-up.   ALLERGIES:  is allergic to ciprofloxacin and penicillins.  MEDICATIONS:  Current Outpatient Medications  Medication Sig Dispense Refill  . acetaminophen (TYLENOL) 500 MG tablet Take by mouth.    Marland Kitchen anastrozole (ARIMIDEX) 1 MG tablet TAKE 1 TABLET(1 MG) BY MOUTH DAILY 90 tablet 3  . Cholecalciferol (VITAMIN D) 10 MCG/ML LIQD Vitamin D    . esomeprazole (NEXIUM) 20 MG capsule TAKE 1 CAPSULE BY MOUTH EVERY DAY AT 12 NOON (Patient not  taking: Reported on 06/28/2019) 30 capsule 2  . levothyroxine (SYNTHROID, LEVOTHROID) 50 MCG tablet TK 1 T PO QD  6  . Magnesium 100 MG CAPS Take 1,350 mg by mouth.    . Methylcobalamin (METHYL B-12) 1000 MCG LOZG Take by mouth.    . Probiotic Product (EQ PROBIOTIC) CAPS Take by mouth. Mega spore boitic    . valACYclovir (VALTREX) 500 MG tablet Take 500 mg by mouth daily before breakfast.     . Zinc 30 MG TABS Take by mouth.     No current facility-administered medications for this visit.    PHYSICAL EXAMINATION: ECOG PERFORMANCE STATUS: 1 - Symptomatic but completely ambulatory  Vitals:   11/15/19 1158  BP: 117/72  Pulse: (!) 58  Resp: 19  Temp: 98.2 F (36.8 C)  SpO2: 100%   Filed Weights   11/15/19 1158  Weight: 184 lb 11.2 oz (83.8 kg)    BREAST: Bilateral mastectomies   (exam performed in the presence of a chaperone)  LABORATORY DATA:  I have reviewed the data as listed CMP Latest Ref Rng & Units 07/23/2015 06/05/2015 05/26/2015  Glucose 65 - 99 mg/dL 89 105(H) 100(H)  BUN 6 - 20 mg/dL 13 5(L) 12  Creatinine 0.44 - 1.00 mg/dL 0.93 0.74 0.76  Sodium 135 - 145 mmol/L 139 142 141  Potassium 3.5 - 5.1 mmol/L 4.3 3.9 4.1  Chloride 101 - 111 mmol/L 102 106 105  CO2 22 - 32 mmol/L '24 26 25  '$ Calcium 8.9 - 10.3 mg/dL  9.5 8.9 9.7  Total Protein 6.5 - 8.1 g/dL - - -  Total Bilirubin 0.3 - 1.2 mg/dL - - -  Alkaline Phos 38 - 126 U/L - - -  AST 15 - 41 U/L - - -  ALT 14 - 54 U/L - - -    Lab Results  Component Value Date   WBC 4.8 07/23/2015   HGB 11.9 (L) 07/23/2015   HCT 36.6 07/23/2015   MCV 90.8 07/23/2015   PLT 291 07/23/2015   NEUTROABS 3.0 11/03/2014    ASSESSMENT & PLAN:  Breast cancer of lower-outer quadrant of right female breast Right breast invasive lobular cancer 5.5 cm multifocal disease with 6/12 lymph nodes positive T3, N2, M0 stage IIIa, ER 92%, PR 98%, HER-2 negative ratio 1.13, Ki-67 10% status post bilateral mastectomies on 04/17/2014, status post  adjuvant chemotherapy started 06/12/2014 and completed 10/23/2014, started radiation June 2016 completed August 2016. Started anastrozole 02/02/2015 7 years  Anastrozole toxicities: Denies any hot flashes or myalgias. Tolerating it extremely well. She does have some muscle stiffness and achiness in her hands but it gets better with activity.  Breast reconstruction with latissimus dorsi flap done in December 2016 and February 2017.  Bone density 03/24/2015 (Dr.Grewall) T score -0.8 normal Taking Zinc, B 12 and Vit D, probiotic  Weight Gain: Seeing [facility] (sees Raoul Pitch)  Breast cancer surveillance: Physical examination does not reveal any abnormalities. Return to clinic in 1 year for follow-up    No orders of the defined types were placed in this encounter.  The patient has a good understanding of the overall plan. she agrees with it. she will call with any problems that may develop before the next visit here.  Total time spent: 20 mins including face to face time and time spent for planning, charting and coordination of care  Nicholas Lose, MD 11/15/2019  I, Cloyde Reams Dorshimer, am acting as scribe for Dr. Nicholas Lose.  I have reviewed the above documentation for accuracy and completeness, and I agree with the above.

## 2019-11-15 ENCOUNTER — Inpatient Hospital Stay: Payer: Commercial Managed Care - PPO | Attending: Hematology and Oncology | Admitting: Hematology and Oncology

## 2019-11-15 ENCOUNTER — Other Ambulatory Visit: Payer: Self-pay

## 2019-11-15 DIAGNOSIS — Z9221 Personal history of antineoplastic chemotherapy: Secondary | ICD-10-CM | POA: Insufficient documentation

## 2019-11-15 DIAGNOSIS — Z170 Estrogen receptor positive status [ER+]: Secondary | ICD-10-CM | POA: Diagnosis not present

## 2019-11-15 DIAGNOSIS — Z9013 Acquired absence of bilateral breasts and nipples: Secondary | ICD-10-CM | POA: Insufficient documentation

## 2019-11-15 DIAGNOSIS — Z923 Personal history of irradiation: Secondary | ICD-10-CM | POA: Diagnosis not present

## 2019-11-15 DIAGNOSIS — Z79811 Long term (current) use of aromatase inhibitors: Secondary | ICD-10-CM | POA: Insufficient documentation

## 2019-11-15 DIAGNOSIS — Z79899 Other long term (current) drug therapy: Secondary | ICD-10-CM | POA: Insufficient documentation

## 2019-11-15 DIAGNOSIS — C50511 Malignant neoplasm of lower-outer quadrant of right female breast: Secondary | ICD-10-CM | POA: Diagnosis present

## 2019-11-15 MED ORDER — ANASTROZOLE 1 MG PO TABS: ORAL_TABLET | ORAL | 3 refills | Status: DC

## 2019-11-15 NOTE — Assessment & Plan Note (Signed)
Right breast invasive lobular cancer 5.5 cm multifocal disease with 6/12 lymph nodes positive T3, N2, M0 stage IIIa, ER 92%, PR 98%, HER-2 negative ratio 1.13, Ki-67 10% status post bilateral mastectomies on 04/17/2014, status post adjuvant chemotherapy started 06/12/2014 and completed 10/23/2014, started radiation June 2016 completed August 2016. Started anastrozole 02/02/2015 7 years  Anastrozole toxicities: Denies any hot flashes or myalgias. Tolerating it extremely well. She does have some muscle stiffness and achiness in her hands but it gets better with activity.  Breast reconstruction with latissimus dorsi flap done in December 2016 and February 2017.  Bone density 03/24/2015 (Dr.Grewall) T score -0.8 normal Taking Zinc, B 12 and Vit D, probiotic  Weight Gain: Seeing [facility] (sees Raoul Pitch)  Breast cancer surveillance: Physical examination does not reveal any abnormalities. Return to clinic in 1 year for follow-up

## 2019-11-18 ENCOUNTER — Ambulatory Visit: Payer: Commercial Managed Care - PPO | Admitting: Hematology and Oncology

## 2019-11-18 ENCOUNTER — Telehealth: Payer: Self-pay | Admitting: Hematology and Oncology

## 2019-11-18 NOTE — Telephone Encounter (Signed)
Scheduled per 06/11 los, patient has been called and notified.

## 2019-11-28 ENCOUNTER — Ambulatory Visit (INDEPENDENT_AMBULATORY_CARE_PROVIDER_SITE_OTHER): Payer: Commercial Managed Care - PPO | Admitting: Psychology

## 2019-11-28 DIAGNOSIS — F4323 Adjustment disorder with mixed anxiety and depressed mood: Secondary | ICD-10-CM | POA: Diagnosis not present

## 2020-01-30 ENCOUNTER — Ambulatory Visit (INDEPENDENT_AMBULATORY_CARE_PROVIDER_SITE_OTHER): Payer: Commercial Managed Care - PPO | Admitting: Psychology

## 2020-01-30 DIAGNOSIS — F4323 Adjustment disorder with mixed anxiety and depressed mood: Secondary | ICD-10-CM

## 2020-04-02 ENCOUNTER — Ambulatory Visit (INDEPENDENT_AMBULATORY_CARE_PROVIDER_SITE_OTHER): Payer: Commercial Managed Care - PPO | Admitting: Psychology

## 2020-04-02 DIAGNOSIS — F4323 Adjustment disorder with mixed anxiety and depressed mood: Secondary | ICD-10-CM

## 2020-04-23 ENCOUNTER — Ambulatory Visit (INDEPENDENT_AMBULATORY_CARE_PROVIDER_SITE_OTHER): Payer: Commercial Managed Care - PPO | Admitting: Psychology

## 2020-04-23 DIAGNOSIS — F4323 Adjustment disorder with mixed anxiety and depressed mood: Secondary | ICD-10-CM | POA: Diagnosis not present

## 2020-05-21 ENCOUNTER — Ambulatory Visit (INDEPENDENT_AMBULATORY_CARE_PROVIDER_SITE_OTHER): Payer: Commercial Managed Care - PPO | Admitting: Psychology

## 2020-05-21 DIAGNOSIS — F4323 Adjustment disorder with mixed anxiety and depressed mood: Secondary | ICD-10-CM

## 2020-06-25 ENCOUNTER — Other Ambulatory Visit: Payer: Self-pay

## 2020-06-25 ENCOUNTER — Ambulatory Visit
Admission: RE | Admit: 2020-06-25 | Discharge: 2020-06-25 | Disposition: A | Discharge status: Home / Self Care | Payer: Commercial Managed Care - PPO | Source: Ambulatory Visit | Attending: Obstetrics and Gynecology | Admitting: Obstetrics and Gynecology

## 2020-06-25 ENCOUNTER — Other Ambulatory Visit: Payer: Self-pay | Admitting: Obstetrics and Gynecology

## 2020-06-25 ENCOUNTER — Ambulatory Visit: Payer: Commercial Managed Care - PPO | Admitting: Psychology

## 2020-06-25 DIAGNOSIS — Z9011 Acquired absence of right breast and nipple: Secondary | ICD-10-CM

## 2020-06-25 DIAGNOSIS — Z9012 Acquired absence of left breast and nipple: Secondary | ICD-10-CM

## 2020-06-25 DIAGNOSIS — N6310 Unspecified lump in the right breast, unspecified quadrant: Secondary | ICD-10-CM

## 2020-06-26 ENCOUNTER — Ambulatory Visit
Admission: RE | Admit: 2020-06-26 | Discharge: 2020-06-26 | Disposition: A | Discharge status: Home / Self Care | Payer: Commercial Managed Care - PPO | Source: Ambulatory Visit | Attending: Obstetrics and Gynecology | Admitting: Obstetrics and Gynecology

## 2020-06-26 ENCOUNTER — Other Ambulatory Visit: Payer: Self-pay | Admitting: Obstetrics and Gynecology

## 2020-06-26 DIAGNOSIS — N6310 Unspecified lump in the right breast, unspecified quadrant: Secondary | ICD-10-CM

## 2020-06-26 DIAGNOSIS — Z9011 Acquired absence of right breast and nipple: Secondary | ICD-10-CM

## 2020-06-26 DIAGNOSIS — Z9012 Acquired absence of left breast and nipple: Secondary | ICD-10-CM

## 2020-07-30 ENCOUNTER — Ambulatory Visit (INDEPENDENT_AMBULATORY_CARE_PROVIDER_SITE_OTHER): Payer: Commercial Managed Care - PPO | Admitting: Psychology

## 2020-07-30 DIAGNOSIS — F4323 Adjustment disorder with mixed anxiety and depressed mood: Secondary | ICD-10-CM

## 2020-08-05 ENCOUNTER — Other Ambulatory Visit: Payer: Commercial Managed Care - PPO

## 2020-08-11 ENCOUNTER — Telehealth: Payer: Self-pay | Admitting: Hematology and Oncology

## 2020-08-11 NOTE — Telephone Encounter (Signed)
Rescheduled upcoming appointment per 3/8 schedule message due to patient being out of town. Patient is aware of changes.

## 2020-08-21 ENCOUNTER — Encounter: Payer: Self-pay | Admitting: Plastic Surgery

## 2020-08-21 ENCOUNTER — Other Ambulatory Visit: Payer: Self-pay

## 2020-08-21 ENCOUNTER — Ambulatory Visit: Payer: Commercial Managed Care - PPO | Admitting: Plastic Surgery

## 2020-08-21 VITALS — BP 112/77 | HR 72 | Ht 66.0 in | Wt 184.8 lb

## 2020-08-21 DIAGNOSIS — Z170 Estrogen receptor positive status [ER+]: Secondary | ICD-10-CM

## 2020-08-21 DIAGNOSIS — N644 Mastodynia: Secondary | ICD-10-CM | POA: Diagnosis not present

## 2020-08-21 DIAGNOSIS — C50511 Malignant neoplasm of lower-outer quadrant of right female breast: Secondary | ICD-10-CM | POA: Diagnosis not present

## 2020-08-21 DIAGNOSIS — T8544XA Capsular contracture of breast implant, initial encounter: Secondary | ICD-10-CM | POA: Insufficient documentation

## 2020-08-21 NOTE — Progress Notes (Signed)
Patient ID: Olivia Acosta, female    DOB: 1960/06/21, 60 y.o.   MRN: [id number]   Chief Complaint  Patient presents with  . Advice Only  . Breast Problem    The patient is a 60 year old female here for evaluation of her breasts.  The patient has had right breast cancer in 2015.  She had a routine screening mammogram done in March 2015 which did not show any concerning areas.  Two months later she felt a lump in her breast which led to a work-up which included MRI and biopsies.  She ended up with right-sided breast cancer (multifocal invasive lobular cancer).  She had 6 of 12 lymph nodes that were positive.  She had bilateral mastectomies in November 2015 and expanders were placed.  She had some complications and ended up with right-sided radiation and a right latissimus muscle flap with a silicone implant placed.  From the medical records it appears she has [employer]or silicone implants 518 cc, [id number] bilaterally.  Today she complains about pain and tenderness in the right upper outer breast almost in the axilla.  She does not like the way that the right breast looks but her biggest complaint is the tenderness.  She had an ultrasound and a biopsy done of a right medial lump.  It was fat necrosis and otherwise no abnormalities noticed.  She is 5 feet 6 inches tall and weighs 184 pounds.  She states she has a high pain tolerance.  She also stated that the worst pain she had was with a latissimus muscle flap.   Review of Systems  Constitutional: Negative.   HENT: Negative.   Eyes: Negative.   Respiratory: Positive for chest tightness. Negative for shortness of breath.   Cardiovascular: Negative for leg swelling.  Gastrointestinal: Negative for abdominal distention and abdominal pain.  Endocrine: Negative.   Genitourinary: Negative.   Hematological: Negative.   Psychiatric/Behavioral: Negative.     Past Medical History:  Diagnosis Date  . Arthritis    fingers stiff  . Breast  cancer ([place])    R breast- 2015  . Bronchitis 2016   treated /w IV antibiotics, admitted for 2 nights [facility]  . GERD (gastroesophageal reflux disease)   . Neuropathy    both feet since chemotherapy  . Thyroid disease   . UTI (urinary tract infection)     Past Surgical History:  Procedure Laterality Date  . BREAST RECONSTRUCTION Bilateral 04/17/2014  . BREAST RECONSTRUCTION Right 07/23/2015   Procedure: RIGHT CHEST WOUND DEBRIDEMENT, RIGHT BREAST RECONSTRUCTION WITH SILICONE GEL IMPLANT;  Surgeon: Crissie Reese, MD;  Location: [facility];  Service: Plastics;  Laterality: Right;  . BREAST RECONSTRUCTION WITH PLACEMENT OF TISSUE EXPANDER AND FLEX HD (ACELLULAR HYDRATED DERMIS) Bilateral 04/17/2014   Procedure: BILATERAL BREAST RECONSTRUCTION WITH PLACEMENT OF TISSUE EXPANDER AND FLEX HD (ACELLULAR HYDRATED DERMIS);  Surgeon: Crissie Reese, MD;  Location: [facility];  Service: Plastics;  Laterality: Bilateral;  . COMPLETE MASTECTOMY W/ SENTINEL NODE BIOPSY Bilateral 04/17/2014   NIPPLE SPARING RECONSTRUCTION  . LATISSIMUS FLAP TO BREAST Right 07/23/2015   Procedure: LATISSIMUS FLAP TO RIGHT CHEST;  Surgeon: Crissie Reese, MD;  Location: [facility];  Service: Plastics;  Laterality: Right;  . MASTECTOMY W/ SENTINEL NODE BIOPSY Bilateral 04/17/2014   Procedure: BILATERAL NIPPLE SPARING MASTECTOMY WITH SENTINEL LYMPH NODE BIOPSY;  Surgeon: Excell Seltzer, MD;  Location: [facility];  Service: General;  Laterality: Bilateral;  . PORT A CATH REVISION Right 05/27/2014   Procedure: PORT A  CATH REVISION;  Surgeon: Excell Seltzer, MD;  Location: [facility];  Service: General;  Laterality: Right;  . PORT-A-CATH REMOVAL Right 06/04/2015   Procedure: REMOVAL PORT-A-CATH;  Surgeon: Crissie Reese, MD;  Location: [facility];  Service: Plastics;  Laterality: Right;  . PORTACATH PLACEMENT Left 05/27/2014   Procedure: INSERTION PORT-A-CATH;  Surgeon: Excell Seltzer, MD;  Location: [facility];  Service:  General;  Laterality: Left;  . REMOVAL OF TISSUE EXPANDER AND PLACEMENT OF IMPLANT Bilateral 06/04/2015   Procedure: REMOVAL OF BILATERAL TISSUE EXPANDER AND DELAYED BREAST RECONSTRUCTION PLACEMENT OF IMPLANTS;  Surgeon: Crissie Reese, MD;  Location: [facility];  Service: Plastics;  Laterality: Bilateral;  . SALIVARY STONE REMOVAL  2009  . stricture of the uretha  1975  . TONSILLECTOMY    . TONSILLECTOMY AND ADENOIDECTOMY  1974  . URETHRAL STRICTURE DILATATION        Current Outpatient Medications:  .  acetaminophen (TYLENOL) 500 MG tablet, Take by mouth., Disp: , Rfl:  .  anastrozole (ARIMIDEX) 1 MG tablet, TAKE 1 TABLET(1 MG) BY MOUTH DAILY, Disp: 90 tablet, Rfl: 3 .  Cholecalciferol (VITAMIN D) 10 MCG/ML LIQD, Vitamin D, Disp: , Rfl:  .  esomeprazole (NEXIUM) 20 MG capsule, TAKE 1 CAPSULE BY MOUTH EVERY DAY AT 12 NOON, Disp: 30 capsule, Rfl: 2 .  levothyroxine (SYNTHROID, LEVOTHROID) 50 MCG tablet, TK 1 T PO QD, Disp: , Rfl: 6 .  Magnesium 100 MG CAPS, Take 1,350 mg by mouth., Disp: , Rfl:  .  Methylcobalamin (METHYL B-12) 1000 MCG LOZG, Take by mouth., Disp: , Rfl:  .  Probiotic Product (EQ PROBIOTIC) CAPS, Take by mouth. Mega spore boitic, Disp: , Rfl:  .  valACYclovir (VALTREX) 500 MG tablet, Take 500 mg by mouth daily before breakfast. , Disp: , Rfl:  .  Zinc 30 MG TABS, Take by mouth., Disp: , Rfl:    Objective:   Vitals:   08/21/20 0931  BP: 112/77  Pulse: 72  SpO2: 97%    Physical Exam Vitals and nursing note reviewed.  Constitutional:      Appearance: Normal appearance.  HENT:     Head: Normocephalic and atraumatic.  Cardiovascular:     Rate and Rhythm: Normal rate.     Pulses: Normal pulses.  Pulmonary:     Effort: Pulmonary effort is normal. No respiratory distress.  Abdominal:     General: Abdomen is flat. There is no distension.     Tenderness: There is no abdominal tenderness.  Skin:    General: Skin is warm.     Coloration: Skin is not jaundiced.      Findings: No bruising.  Neurological:     General: No focal deficit present.     Mental Status: She is alert and oriented to person, place, and time.  Psychiatric:        Mood and Affect: Mood normal.        Behavior: Behavior normal.     Assessment & Plan:  Malignant neoplasm of lower-outer quadrant of right breast of female, estrogen receptor positive ([place])  Recommend repeat ultrasound with a focus on the right axilla and right upper outer breast area.  I want to make sure there is not anything going on there.  If that is inconclusive we may need to do an MRI.  We will plan to do a telemetry visit in 1 month for follow-up to make sure nothing is dropped.  I have confirmed all the above information is  correct with the patient.  I also reviewed the patient's previous records in epic.  Surgery is an option for making the breast looked different.  My concern is that I can make the breast looked different but not improve her pain level.  Patient is aware of my concerns.   Pictures were obtained of the patient and placed in the chart with the patient's or guardian's permission.   [place], DO

## 2020-08-26 ENCOUNTER — Telehealth: Payer: Self-pay | Admitting: Hematology and Oncology

## 2020-08-26 NOTE — Telephone Encounter (Signed)
Scheduled per 3/22 sch msg. Called and spoke with pt, confirmed 6/6 appt

## 2020-09-08 ENCOUNTER — Ambulatory Visit: Payer: Commercial Managed Care - PPO | Attending: Plastic Surgery | Admitting: Physical Therapy

## 2020-09-08 ENCOUNTER — Encounter: Payer: Self-pay | Admitting: Physical Therapy

## 2020-09-08 ENCOUNTER — Other Ambulatory Visit: Payer: Self-pay

## 2020-09-08 DIAGNOSIS — Z483 Aftercare following surgery for neoplasm: Secondary | ICD-10-CM | POA: Diagnosis present

## 2020-09-08 DIAGNOSIS — C50511 Malignant neoplasm of lower-outer quadrant of right female breast: Secondary | ICD-10-CM | POA: Insufficient documentation

## 2020-09-08 DIAGNOSIS — L599 Disorder of the skin and subcutaneous tissue related to radiation, unspecified: Secondary | ICD-10-CM | POA: Diagnosis present

## 2020-09-08 DIAGNOSIS — N644 Mastodynia: Secondary | ICD-10-CM | POA: Diagnosis present

## 2020-09-08 DIAGNOSIS — M6281 Muscle weakness (generalized): Secondary | ICD-10-CM | POA: Insufficient documentation

## 2020-09-08 NOTE — Therapy (Signed)
[place], [place], [address] Phone: [phone]   Fax:  [phone]  Physical Therapy Evaluation  Patient Details  Name: Olivia Acosta MRN: [id number] Date of Birth: 07-Jul-1960 Referring Provider (PT): Dillingham   Encounter Date: 09/08/2020   PT End of Session - 09/08/20 0853    Visit Number 1    Number of Visits 9    Date for PT Re-Evaluation 10/06/20    PT Start Time 0807    PT Stop Time 0851    PT Time Calculation (min) 44 min    Activity Tolerance Patient tolerated treatment well    Behavior During Therapy [facility] for tasks assessed/performed           Past Medical History:  Diagnosis Date  . Arthritis    fingers stiff  . Breast cancer ([place])    R breast- 2015  . Bronchitis 2016   treated /w IV antibiotics, admitted for 2 nights [facility]  . GERD (gastroesophageal reflux disease)   . Neuropathy    both feet since chemotherapy  . Thyroid disease   . UTI (urinary tract infection)     Past Surgical History:  Procedure Laterality Date  . BREAST RECONSTRUCTION Bilateral 04/17/2014  . BREAST RECONSTRUCTION Right 07/23/2015   Procedure: RIGHT CHEST WOUND DEBRIDEMENT, RIGHT BREAST RECONSTRUCTION WITH SILICONE GEL IMPLANT;  Surgeon: Crissie Reese, MD;  Location: [place];  Service: Plastics;  Laterality: Right;  . BREAST RECONSTRUCTION WITH PLACEMENT OF TISSUE EXPANDER AND FLEX HD (ACELLULAR HYDRATED DERMIS) Bilateral 04/17/2014   Procedure: BILATERAL BREAST RECONSTRUCTION WITH PLACEMENT OF TISSUE EXPANDER AND FLEX HD (ACELLULAR HYDRATED DERMIS);  Surgeon: Crissie Reese, MD;  Location: [place];  Service: Plastics;  Laterality: Bilateral;  . COMPLETE MASTECTOMY W/ SENTINEL NODE BIOPSY Bilateral 04/17/2014   NIPPLE SPARING RECONSTRUCTION  . LATISSIMUS FLAP TO BREAST Right 07/23/2015   Procedure: LATISSIMUS FLAP TO RIGHT CHEST;  Surgeon: Crissie Reese, MD;  Location: [place];  Service: Plastics;   Laterality: Right;  . MASTECTOMY W/ SENTINEL NODE BIOPSY Bilateral 04/17/2014   Procedure: BILATERAL NIPPLE SPARING MASTECTOMY WITH SENTINEL LYMPH NODE BIOPSY;  Surgeon: Excell Seltzer, MD;  Location: [place];  Service: General;  Laterality: Bilateral;  . PORT A CATH REVISION Right 05/27/2014   Procedure: PORT A CATH REVISION;  Surgeon: Excell Seltzer, MD;  Location: [place];  Service: General;  Laterality: Right;  . PORT-A-CATH REMOVAL Right 06/04/2015   Procedure: REMOVAL PORT-A-CATH;  Surgeon: Crissie Reese, MD;  Location: [place];  Service: Plastics;  Laterality: Right;  . PORTACATH PLACEMENT Left 05/27/2014   Procedure: INSERTION PORT-A-CATH;  Surgeon: Excell Seltzer, MD;  Location: [place];  Service: General;  Laterality: Left;  . REMOVAL OF TISSUE EXPANDER AND PLACEMENT OF IMPLANT Bilateral 06/04/2015   Procedure: REMOVAL OF BILATERAL TISSUE EXPANDER AND DELAYED BREAST RECONSTRUCTION PLACEMENT OF IMPLANTS;  Surgeon: Crissie Reese, MD;  Location: [place];  Service: Plastics;  Laterality: Bilateral;  . SALIVARY STONE REMOVAL  2009  . stricture of the uretha  1975  . TONSILLECTOMY    . TONSILLECTOMY AND ADENOIDECTOMY  1974  . URETHRAL STRICTURE DILATATION      There were no vitals filed for this visit.    Subjective Assessment - 09/08/20 0811    Subjective I am having R breast pain. There is a knot in my upper breast and they are going to do an ultrasound. I had needle biopsies for fat necrosis but I think this is  different. I get a lot of spasms in my lat on the right side.    Pertinent History double mastectomy 04/2014, chemo started Jan 2016, radiation June 2016, reconstructive surgery 05/2015, the tissue gave way and did not heal on R side so pt had lat dorsi flap procedure in 07/2015, diagnosed with Hashimotos disease 2019    Patient Stated Goals decrease back spasms and R breast pain    Currently in Pain? No/denies    Pain Score 0-No pain               [facility] PT Assessment - 09/08/20 0001      Assessment   Medical Diagnosis right breast cancer    Referring Provider (PT) Dillingham    Onset Date/Surgical Date 07/23/15    Hand Dominance Right    Prior Therapy PT in 2017 for ROM R shoulder, PT in 2018 in ROM in R shoulder      Precautions   Precautions Other (comment)   lymphedema RUE   Precaution Comments lymphedema RUE      Restrictions   Weight Bearing Restrictions No      Balance Screen   Has the patient fallen in the past 6 months No    Has the patient had a decrease in activity level because of a fear of falling?  No    Is the patient reluctant to leave their home because of a fear of falling?  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 [employer], walking a couple days a week, free weights a couple times a week      Cognition   Overall Cognitive Status Within Functional Limits for tasks assessed      Observation/Other Assessments   Skin Integrity back is very tight, pec on right is very tight, lump present on R pec that is tender to touch      Posture/Postural Control   Posture/Postural Control Postural limitations    Postural Limitations Rounded Shoulders;Forward head   slight     ROM / Strength   AROM / PROM / Strength Strength      AROM   Right Shoulder Extension 72 Degrees    Right Shoulder Flexion 160 Degrees    Right Shoulder ABduction 179 Degrees    Right Shoulder Internal Rotation 73 Degrees    Right Shoulder External Rotation 82 Degrees    Left Shoulder Extension 70 Degrees    Left Shoulder Flexion 164 Degrees    Left Shoulder ABduction 180 Degrees     Left Shoulder Internal Rotation 65 Degrees    Left Shoulder External Rotation 84 Degrees      Strength   Overall Strength --    Right/Left hand Right;Left    Right Hand Grip (lbs) 47   avg for age is 23   Left Hand Grip (lbs) 52   avg for age is 89            LYMPHEDEMA/ONCOLOGY QUESTIONNAIRE - 09/08/20 0001      Type   Cancer Type right breast cancer with bilateral mastectomies  Surgeries   Mastectomy Date 04/17/14    Other Surgery Date 07/23/15   revision in February;1st reconstruction 06/04/15   Number Lymph Nodes Removed 2   but 10 negative     Treatment   Past Chemotherapy Treatment Yes    Date 10/23/14    Past Radiation Treatment Yes    Date 01/02/15      What other symptoms do you have   Are you Having Heaviness or Tightness Yes    Are you having Pain Yes    Are you having pitting edema No    Is it Hard or Difficult finding clothes that fit Yes    Do you have infections No    Is there Decreased scar mobility Yes      Lymphedema Assessments   Lymphedema Assessments Upper extremities      Right Upper Extremity Lymphedema   15 cm Proximal to Olecranon Process 31.5 cm    10 cm Proximal to Olecranon Process 30.5 cm    Olecranon Process 26.5 cm    15 cm Proximal to Ulnar Styloid Process 24.3 cm    10 cm Proximal to Ulnar Styloid Process 20.7 cm    Just Proximal to Ulnar Styloid Process 15.5 cm    Across Hand at [employer] 19 cm    At [place] of 2nd Digit 6.8 cm      Left Upper Extremity Lymphedema   15 cm Proximal to Olecranon Process 31 cm    10 cm Proximal to Olecranon Process 29.7 cm    Olecranon Process 26 cm    15 cm Proximal to Ulnar Styloid Process 23.7 cm    10 cm Proximal to Ulnar Styloid Process 20 cm    Just Proximal to Ulnar Styloid Process 15.1 cm    Across Hand at [employer] 17.8 cm    At [place] of 2nd Digit 6.4 cm                   Objective measurements completed on examination: See above findings.                PT Education - 09/08/20 0901    Education provided Yes    Education Details ordering hand strengthening tools on [employer] and using these at home to improve R grip strength    Person(s) Educated Patient    Methods Explanation    Comprehension Verbalized understanding               PT Long Term Goals - 09/08/20 0859      PT LONG TERM GOAL #1   Title Pt will report a 75% improvement in back pain on R side when reaching for objects to allow improved comfort.    Time 4    Period Weeks    Status New    Target Date 10/06/20      PT LONG TERM GOAL #2   Title Pt will report a 75% improvement in R breast/chest pain throughout the day to allow improved comfort.    Time 4    Period Weeks    Status New    Target Date 10/06/20      PT LONG TERM GOAL #3   Title Pt will be independent in a home exercise program for continued strengthening and stretching.    Time 4    Period Weeks    Status New    Target Date 10/06/20  Plan - 09/08/20 0853    Clinical Impression Statement Pt presents to PT with R breast pain and R back pain. She underwent double mastectomy 04/2014, chemo started Jan 2016, radiation June 2016, reconstructive surgery 05/2015, the tissue gave way and did not heal on R side so pt had lat dorsi flap procedure in 07/2015. She has had ongoing pain in her R back especially when reaching in certain directions. Pt reports she used to go to the gym to work out but since covid has not been back and has been doing free weights at home. Her R pec is tight and there is a palpable mass on R chest that pt will be having an ultrasound for on 09/23/20. Pt does not demonstrate any signs or symptoms of lymphedema at this time. She may have some fullness in R lateral trunk but it is hard to tell if this is swelling or extra tissue. She does have R pec and lat tightness and would benefit from skilled PT services to address this.    Stability/Clinical  Decision Making Stable/Uncomplicated    Clinical Decision Making Low    Rehab Potential Excellent    PT Frequency 2x / week    PT Duration 4 weeks    PT Treatment/Interventions ADLs/Self Care Home Management;DME Instruction;Therapeutic exercise;Patient/family education;Manual techniques;Manual lymph drainage;Passive range of motion;Orthotic Fit/Training;Compression bandaging;Scar mobilization;Taping    PT Next Visit Plan begin STM to R lat and pec, stretching for pec and lats, hold off on manual therapy for lump until results are back on 4/20    Consulted and Agree with Plan of Care Patient           Patient will benefit from skilled therapeutic intervention in order to improve the following deficits and impairments:  Decreased range of motion,Decreased strength,Impaired UE functional use,Pain,Increased fascial restricitons,Decreased knowledge of use of DME,Decreased scar mobility,Postural dysfunction,Increased edema  Visit Diagnosis: Breast pain  Aftercare following surgery for neoplasm  Disorder of the skin and subcutaneous tissue related to radiation, unspecified  Muscle weakness (generalized)  Malignant neoplasm of lower-outer quadrant of right female breast, unspecified estrogen receptor status ([place])     Problem List Patient Active Problem List   Diagnosis Date Noted  . Breast pain 08/21/2020  . Breast implant capsular contracture 08/21/2020  . Acquired hypothyroidism 11/21/2017  . Weight gain 11/21/2017  . Gastroesophageal reflux disease 11/21/2017  . Left foot pain 09/19/2016  . Breast cancer in female [facility]) 07/23/2015  . Breast cancer, right breast ([place]) 06/04/2015  . Fever 09/14/2014  . Leukopenia 09/14/2014  . Flu-like symptoms 09/14/2014  . Acute bronchitis 09/14/2014  . Breast cancer of lower-outer quadrant of right female breast ([place]) 04/17/2014  . DEPRESSION 08/03/2007  . SWELLING MASS OR LUMP IN HEAD AND NECK 08/03/2007    Allyson Sabal  [facility] 09/08/2020, 9:02 AM  [place], [place], [address] Phone: [phone]   Fax:  [phone]  Name: Olivia Acosta MRN: [id number] Date of Birth: Oct 28, 1960  Manus Gunning, PT 09/08/20 9:02 AM

## 2020-09-14 ENCOUNTER — Encounter: Payer: Self-pay | Admitting: Family Medicine

## 2020-09-14 ENCOUNTER — Other Ambulatory Visit: Payer: Self-pay

## 2020-09-14 ENCOUNTER — Ambulatory Visit (INDEPENDENT_AMBULATORY_CARE_PROVIDER_SITE_OTHER): Payer: Commercial Managed Care - PPO | Admitting: Family Medicine

## 2020-09-14 VITALS — BP 118/90 | HR 69 | Temp 98.6°F | Ht 65.0 in | Wt 148.0 lb

## 2020-09-14 DIAGNOSIS — Z0000 Encounter for general adult medical examination without abnormal findings: Secondary | ICD-10-CM | POA: Diagnosis not present

## 2020-09-14 DIAGNOSIS — Z853 Personal history of malignant neoplasm of breast: Secondary | ICD-10-CM | POA: Diagnosis not present

## 2020-09-14 DIAGNOSIS — R030 Elevated blood-pressure reading, without diagnosis of hypertension: Secondary | ICD-10-CM | POA: Diagnosis not present

## 2020-09-14 DIAGNOSIS — E063 Autoimmune thyroiditis: Secondary | ICD-10-CM

## 2020-09-14 DIAGNOSIS — N644 Mastodynia: Secondary | ICD-10-CM

## 2020-09-14 LAB — TSH: TSH: 2.18 u[IU]/mL (ref 0.35–4.50)

## 2020-09-14 LAB — LIPID PANEL
Cholesterol: 214 mg/dL — ABNORMAL HIGH (ref 0–200)
HDL: 68.8 mg/dL (ref >39.00)
LDL Cholesterol: 124 mg/dL — ABNORMAL HIGH (ref 0–99)
NonHDL: 144.99
Total CHOL/HDL Ratio: 3
Triglycerides: 105 mg/dL (ref 0.0–149.0)
VLDL: 21 mg/dL (ref 0.0–40.0)

## 2020-09-14 LAB — CBC WITH DIFFERENTIAL/PLATELET
Basophils Absolute: 0 10*3/uL (ref 0.0–0.1)
Basophils Relative: 0.8 % (ref 0.0–3.0)
Eosinophils Absolute: 0.1 10*3/uL (ref 0.0–0.7)
Eosinophils Relative: 2.6 % (ref 0.0–5.0)
HCT: 42 % (ref 36.0–46.0)
Hemoglobin: 14.1 g/dL (ref 12.0–15.0)
Lymphocytes Relative: 24.9 % (ref 12.0–46.0)
Lymphs Abs: 1.3 10*3/uL (ref 0.7–4.0)
MCHC: 33.6 g/dL (ref 30.0–36.0)
MCV: 91.7 fl (ref 78.0–100.0)
Monocytes Absolute: 0.4 10*3/uL (ref 0.1–1.0)
Monocytes Relative: 8.2 % (ref 3.0–12.0)
Neutro Abs: 3.4 10*3/uL (ref 1.4–7.7)
Neutrophils Relative %: 63.5 % (ref 43.0–77.0)
Platelets: 206 10*3/uL (ref 150.0–400.0)
RBC: 4.58 Mil/uL (ref 3.87–5.11)
RDW: 14.1 % (ref 11.5–15.5)
WBC: 5.3 10*3/uL (ref 4.0–10.5)

## 2020-09-14 LAB — BASIC METABOLIC PANEL
BUN: 18 mg/dL (ref 6–23)
CO2: 28 mEq/L (ref 19–32)
Calcium: 9.8 mg/dL (ref 8.4–10.5)
Chloride: 100 mEq/L (ref 96–112)
Creatinine, Ser: 0.81 mg/dL (ref 0.40–1.20)
GFR: 80.35 mL/min (ref >60.00)
Glucose, Bld: 86 mg/dL (ref 70–99)
Potassium: 4.3 mEq/L (ref 3.5–5.1)
Sodium: 137 mEq/L (ref 135–145)

## 2020-09-14 LAB — HEMOGLOBIN A1C: Hgb A1c MFr Bld: 5.8 % (ref 4.6–6.5)

## 2020-09-14 LAB — T4, FREE: Free T4: 1 ng/dL (ref 0.60–1.60)

## 2020-09-14 LAB — VITAMIN D 25 HYDROXY (VIT D DEFICIENCY, FRACTURES): VITD: 51.58 ng/mL (ref 30.00–100.00)

## 2020-09-14 NOTE — Patient Instructions (Signed)
Preventive Care 58-58 Years Old, Female Preventive care refers to lifestyle choices and visits with your health care provider that can promote health and wellness. This includes:  A yearly physical exam. This is also called an annual wellness visit.  Regular dental and eye exams.  Immunizations.  Screening for certain conditions.  Healthy lifestyle choices, such as: ? Eating a healthy diet. ? Getting regular exercise. ? Not using drugs or products that contain nicotine and tobacco. ? Limiting alcohol use. What can I expect for my preventive care visit? Physical exam Your health care provider will check your:  Height and weight. These may be used to calculate your BMI (body mass index). BMI is a measurement that tells if you are at a healthy weight.  Heart rate and blood pressure.  Body temperature.  Skin for abnormal spots. Counseling Your health care provider may ask you questions about your:  Past medical problems.  Family's medical history.  Alcohol, tobacco, and drug use.  Emotional well-being.  Home life and relationship well-being.  Sexual activity.  Diet, exercise, and sleep habits.  Work and work Statistician.  Access to firearms.  Method of birth control.  Menstrual cycle.  Pregnancy history. What immunizations do I need? Vaccines are usually given at various ages, according to a schedule. Your health care provider will recommend vaccines for you based on your age, medical history, and lifestyle or other factors, such as travel or where you work.   What tests do I need? Blood tests  Lipid and cholesterol levels. These may be checked every 5 years, or more often if you are over 58 years old.  Hepatitis C test.  Hepatitis B test. Screening  Lung cancer screening. You may have this screening every year starting at age 58 if you have a 30-pack-year history of smoking and currently smoke or have quit within the past 15 years.  Colorectal cancer  screening. ? All adults should have this screening starting at age 58 and continuing until age 17. ? Your health care provider may recommend screening at age 58 if you are at increased risk. ? You will have tests every 1-10 years, depending on your results and the type of screening test.  Diabetes screening. ? This is done by checking your blood sugar (glucose) after you have not eaten for a while (fasting). ? You may have this done every 1-3 years.  Mammogram. ? This may be done every 1-2 years. ? Talk with your health care provider about when you should start having regular mammograms. This may depend on whether you have a family history of breast cancer.  BRCA-related cancer screening. This may be done if you have a family history of breast, ovarian, tubal, or peritoneal cancers.  Pelvic exam and Pap test. ? This may be done every 3 years starting at age 10. ? Starting at age 58, this may be done every 5 years if you have a Pap test in combination with an HPV test. Other tests  STD (sexually transmitted disease) testing, if you are at risk.  Bone density scan. This is done to screen for osteoporosis. You may have this scan if you are at high risk for osteoporosis. Talk with your health care provider about your test results, treatment options, and if necessary, the need for more tests. Follow these instructions at home: Eating and drinking  Eat a diet that includes fresh fruits and vegetables, whole grains, lean protein, and low-fat dairy products.  Take vitamin and mineral supplements  as recommended by your health care provider.  Do not drink alcohol if: ? Your health care provider tells you not to drink. ? You are pregnant, may be pregnant, or are planning to become pregnant.  If you drink alcohol: ? Limit how much you have to 0-1 drink a day. ? Be aware of how much alcohol is in your drink. In the [place] one drink equals one 12 oz bottle of beer (355 mL), one 5 oz glass of  wine (148 mL), or one 1 oz glass of hard liquor (44 mL).   Lifestyle  Take daily care of your teeth and gums. Brush your teeth every morning and night with fluoride toothpaste. Floss one time each day.  Stay active. Exercise for at least 30 minutes 5 or more days each week.  Do not use any products that contain nicotine or tobacco, such as cigarettes, e-cigarettes, and chewing tobacco. If you need help quitting, ask your health care provider.  Do not use drugs.  If you are sexually active, practice safe sex. Use a condom or other form of protection to prevent STIs (sexually transmitted infections).  If you do not wish to become pregnant, use a form of birth control. If you plan to become pregnant, see your health care provider for a prepregnancy visit.  If told by your health care provider, take low-dose aspirin daily starting at age 58.  Find healthy ways to cope with stress, such as: ? Meditation, yoga, or listening to music. ? Journaling. ? Talking to a trusted person. ? Spending time with friends and family. Safety  Always wear your seat belt while driving or riding in a vehicle.  Do not drive: ? If you have been drinking alcohol. Do not ride with someone who has been drinking. ? When you are tired or distracted. ? While texting.  Wear a helmet and other protective equipment during sports activities.  If you have firearms in your house, make sure you follow all gun safety procedures. What's next?  Visit your health care provider once a year for an annual wellness visit.  Ask your health care provider how often you should have your eyes and teeth checked.  Stay up to date on all vaccines. This information is not intended to replace advice given to you by your health care provider. Make sure you discuss any questions you have with your health care provider. Document Revised: 02/25/2020 Document Reviewed: 02/01/2018 Elsevier Patient Education  2021 Elsevier Inc.  

## 2020-09-14 NOTE — Progress Notes (Signed)
Subjective:     Olivia Acosta is a 59 y.o. female and is here for a comprehensive physical exam. The patient reports some increased stress as it relates to dealing with brother and her mother's health.  Patient seen by Dr. Rosana Hoes at integrative medicine.  Taking several vitamins and supplements including vitamin D and berberine.  Taking Synthroid 50 mcg for history of Hashimoto's.  Patient endorses follow-up with oncology and plastic surgery for history of breast cancer.  To continue anastrozole September 2023.  Has a R breast u/s in next wk for pain lateral to port site and MRI if needed.  Concern for fat necrosis vs tissue damage from xrt.  Pt mentions chest feels tight 2/2 breast implants being under the muscle.  Had R breast implant volume reduced, but still notes discomfort.  Considering having them removed in the future.   Pt considering returning to the gym.  Currently walking her dogs for exercise and doing yard work.  Social History   Socioeconomic History  . Marital status: Widowed    Spouse name: Not on file  . Number of children: Not on file  . Years of education: Not on file  . Highest education level: Not on file  Occupational History  . Not on file  Tobacco Use  . Smoking status: Never Smoker  . Smokeless tobacco: Never Used  Substance and Sexual Activity  . Alcohol use: Yes    Comment: occ wine  . Drug use: No  . Sexual activity: Not on file  Other Topics Concern  . Not on file  Social History Narrative  . Not on file   Social Determinants of Health   Financial Resource Strain: Not on file  Food Insecurity: Not on file  Transportation Needs: Not on file  Physical Activity: Not on file  Stress: Not on file  Social Connections: Not on file  Intimate Partner Violence: Not on file   Health Maintenance  Topic Date Due  . Hepatitis C Screening  Never done  . HIV Screening  Never done  . MAMMOGRAM  01/24/2016  . COVID-19 Vaccine (4 - Booster for Moderna  series) 10/01/2020  . INFLUENZA VACCINE  01/04/2021  . PAP SMEAR-Modifier  06/07/2023  . TETANUS/TDAP  12/15/2027  . COLONOSCOPY (Pts 45-61yrs Insurance coverage will need to be confirmed)  08/08/2029  . HPV VACCINES  Aged Out    The following portions of the patient's history were reviewed and updated as appropriate: allergies, current medications, past family history, past medical history, past social history, past surgical history and problem list.  Review of Systems Pertinent items noted in HPI and remainder of comprehensive ROS otherwise negative.   Objective:    BP 118/90 (BP Location: Left Arm, Patient Position: Sitting, Cuff Size: Normal)   Pulse 69   Temp 98.6 F (37 C) (Oral)   Ht 5\' 5"  (1.651 m)   Wt 148 lb (67.1 kg)   LMP 05/28/2011   SpO2 96%   BMI 24.63 kg/m  General appearance: alert, cooperative and no distress Head: Normocephalic, without obvious abnormality, atraumatic Eyes: conjunctivae/corneas clear. PERRL, EOM's intact. Fundi benign. Ears: normal TM's and external ear canals both ears Nose: Nares normal. Septum midline. Mucosa normal. No drainage or sinus tenderness. Throat: lips, mucosa, and tongue normal; teeth and gums normal Neck: no adenopathy, no carotid bruit, no JVD, supple, symmetrical, trachea midline and thyroid not enlarged, symmetric, no tenderness/mass/nodules Lungs: clear to auscultation bilaterally Heart: regular rate and rhythm, S1, S2 normal,  no murmur, click, rub or gallop Abdomen: soft, non-tender; bowel sounds normal; no masses,  no organomegaly Extremities: extremities normal, atraumatic, no cyanosis or edema Pulses: 2+ and symmetric Skin: Skin color, texture, turgor normal. No rashes or lesions  TTP or R upper chest Lymph nodes: Cervical, supraclavicular, and axillary nodes normal. Neurologic: Alert and oriented X 3, normal strength and tone. Normal symmetric reflexes. Normal coordination and gait    Assessment:    Healthy female  exam with R upper chest/breast soreness s/p breast implants due to h/o breast cancer.     Plan:     Anticipatory guidance given including wearing seatbelts, smoke detectors in the home, increasing physical activity, increasing p.o. intake of water and vegetables. -will obtain labs -bone density done 2021 -given handout -next CPE in 1 yr See After Visit Summary for Counseling Recommendations    Hashimoto's thyroiditis  -Continue Synthroid 50 mcg and other supplements -Continue follow-up with Dr. Rosana Hoes, integrative medicine - Plan: TSH, T4, Free  History of breast cancer  - Plan: CBC with Differential/Platelet, Hemoglobin A1c, Lipid panel, Vitamin D, 25-hydroxy  Elevated blood pressure reading -recheck -continue to monitor -lifestyle modifications encouraged  Breast pain -concern for fat necrosis, capsular contracture -u/s next wk -continue f/u with Oncology and Plastic Surgery  F/u in 6 months-1 yr, sooner if needed  Grier Mitts, MD

## 2020-09-15 ENCOUNTER — Encounter: Payer: Self-pay | Admitting: Rehabilitation

## 2020-09-15 ENCOUNTER — Ambulatory Visit: Payer: Commercial Managed Care - PPO | Admitting: Rehabilitation

## 2020-09-15 DIAGNOSIS — L599 Disorder of the skin and subcutaneous tissue related to radiation, unspecified: Secondary | ICD-10-CM

## 2020-09-15 DIAGNOSIS — N644 Mastodynia: Secondary | ICD-10-CM | POA: Diagnosis not present

## 2020-09-15 DIAGNOSIS — M6281 Muscle weakness (generalized): Secondary | ICD-10-CM

## 2020-09-15 DIAGNOSIS — Z483 Aftercare following surgery for neoplasm: Secondary | ICD-10-CM

## 2020-09-15 DIAGNOSIS — C50511 Malignant neoplasm of lower-outer quadrant of right female breast: Secondary | ICD-10-CM

## 2020-09-15 NOTE — Patient Instructions (Signed)
Access Code: [email or web address] URL: [email or web address]: 04/12/2022Prepared by: Marcene Brawn TevisExercises  Sidelying Thoracic Rotation with Open Book - 1 x daily - 7 x weekly - 1-3 sets - 5-10 reps - 10 second hold  Warrior II - 1 x daily - 7 x weekly - 1 sets - 3 reps - 10 hold  Thoracic Extension Mobilization on Foam Roll - 3 x weekly - 3 reps - 5-10 second hold

## 2020-09-15 NOTE — Therapy (Signed)
[place], [place], [address] Phone: [phone]   Fax:  [phone]  Physical Therapy Treatment  Patient Details  Name: Olivia Acosta MRN: [id number] Date of Birth: Jun 24, 1960 Referring Provider (PT): Dillingham   Encounter Date: 09/15/2020   PT End of Session - 09/15/20 1700    Visit Number 2    Number of Visits 9    Date for PT Re-Evaluation 10/06/20    PT Start Time 1604    PT Stop Time 1657    PT Time Calculation (min) 53 min    Activity Tolerance Patient tolerated treatment well    Behavior During Therapy [facility] for tasks assessed/performed           Past Medical History:  Diagnosis Date  . Arthritis    fingers stiff  . Breast cancer ([place])    R breast- 2015  . Bronchitis 2016   treated /w IV antibiotics, admitted for 2 nights [facility]  . GERD (gastroesophageal reflux disease)   . Neuropathy    both feet since chemotherapy  . Thyroid disease   . UTI (urinary tract infection)     Past Surgical History:  Procedure Laterality Date  . BREAST RECONSTRUCTION Bilateral 04/17/2014  . BREAST RECONSTRUCTION Right 07/23/2015   Procedure: RIGHT CHEST WOUND DEBRIDEMENT, RIGHT BREAST RECONSTRUCTION WITH SILICONE GEL IMPLANT;  Surgeon: Crissie Reese, MD;  Location: [place];  Service: Plastics;  Laterality: Right;  . BREAST RECONSTRUCTION WITH PLACEMENT OF TISSUE EXPANDER AND FLEX HD (ACELLULAR HYDRATED DERMIS) Bilateral 04/17/2014   Procedure: BILATERAL BREAST RECONSTRUCTION WITH PLACEMENT OF TISSUE EXPANDER AND FLEX HD (ACELLULAR HYDRATED DERMIS);  Surgeon: Crissie Reese, MD;  Location: [place];  Service: Plastics;  Laterality: Bilateral;  . COMPLETE MASTECTOMY W/ SENTINEL NODE BIOPSY Bilateral 04/17/2014   NIPPLE SPARING RECONSTRUCTION  . LATISSIMUS FLAP TO BREAST Right 07/23/2015   Procedure: LATISSIMUS FLAP TO RIGHT CHEST;  Surgeon: Crissie Reese, MD;  Location: [place];  Service: Plastics;   Laterality: Right;  . MASTECTOMY W/ SENTINEL NODE BIOPSY Bilateral 04/17/2014   Procedure: BILATERAL NIPPLE SPARING MASTECTOMY WITH SENTINEL LYMPH NODE BIOPSY;  Surgeon: Excell Seltzer, MD;  Location: [place];  Service: General;  Laterality: Bilateral;  . PORT A CATH REVISION Right 05/27/2014   Procedure: PORT A CATH REVISION;  Surgeon: Excell Seltzer, MD;  Location: [place];  Service: General;  Laterality: Right;  . PORT-A-CATH REMOVAL Right 06/04/2015   Procedure: REMOVAL PORT-A-CATH;  Surgeon: Crissie Reese, MD;  Location: [place];  Service: Plastics;  Laterality: Right;  . PORTACATH PLACEMENT Left 05/27/2014   Procedure: INSERTION PORT-A-CATH;  Surgeon: Excell Seltzer, MD;  Location: [place];  Service: General;  Laterality: Left;  . REMOVAL OF TISSUE EXPANDER AND PLACEMENT OF IMPLANT Bilateral 06/04/2015   Procedure: REMOVAL OF BILATERAL TISSUE EXPANDER AND DELAYED BREAST RECONSTRUCTION PLACEMENT OF IMPLANTS;  Surgeon: Crissie Reese, MD;  Location: [place];  Service: Plastics;  Laterality: Bilateral;  . SALIVARY STONE REMOVAL  2009  . stricture of the uretha  1975  . TONSILLECTOMY    . TONSILLECTOMY AND ADENOIDECTOMY  1974  . URETHRAL STRICTURE DILATATION      There were no vitals filed for this visit.   Subjective Assessment - 09/15/20 1605    Subjective Nothing new    Pertinent History double mastectomy 04/2014, chemo started Jan 2016, radiation June 2016, reconstructive surgery 05/2015, the tissue gave way and did not heal on R side so pt had lat  dorsi flap procedure in 07/2015, diagnosed with Hashimotos disease 2019    Patient Stated Goals decrease back spasms and R breast pain    Currently in Pain? No/denies                             [facility] Adult PT Treatment/Exercise - 09/15/20 0001      Exercises   Exercises Shoulder;Other Exercises    Other Exercises  verbal instruction on how to use the foam roller horizontally as pt  does rolling to the lat already.  Also verbal review of good yoga poses like warrior and prayer twist      Shoulder Exercises: Sidelying   Other Sidelying Exercises open book Rt 6" x 2 and added to HEP      Manual Therapy   Manual Therapy Soft tissue mobilization;Passive ROM    Soft tissue mobilization in supine to the axilla with arm overhead position and in sidelying to latissimus body and scar tissue mobilization in various cross scar directions.    Passive ROM to the right shoulder into flexion                       PT Long Term Goals - 09/08/20 0859      PT LONG TERM GOAL #1   Title Pt will report a 75% improvement in back pain on R side when reaching for objects to allow improved comfort.    Time 4    Period Weeks    Status New    Target Date 10/06/20      PT LONG TERM GOAL #2   Title Pt will report a 75% improvement in R breast/chest pain throughout the day to allow improved comfort.    Time 4    Period Weeks    Status New    Target Date 10/06/20      PT LONG TERM GOAL #3   Title Pt will be independent in a home exercise program for continued strengthening and stretching.    Time 4    Period Weeks    Status New    Target Date 10/06/20                 Plan - 09/15/20 1701    Clinical Impression Statement Started STM / MFR to upper right quadrant following lat incision as well as assessing scar mobility.  Pt overall has good scar mobility with no significant adhesions but latissimus tightness is evident around mid belly from incision towards axilla with cramp like pain almost reproduced with MMT of the lat and lower trap in sidelying but not ER or mid trap.  Added some thoracic rotation to HEP with discussion about good yoga poses.  Pt will benefit from supine scap supine or seated, etc., to strengthen surrounding muscles.    PT Frequency 2x / week    PT Duration 4 weeks    PT Treatment/Interventions ADLs/Self Care Home Management;DME  Instruction;Therapeutic exercise;Patient/family education;Manual techniques;Manual lymph drainage;Passive range of motion;Orthotic Fit/Training;Compression bandaging;Scar mobilization;Taping    PT Next Visit Plan begin STM to R lat and pec, stretching for pec and lats, hold off on manual therapy for lump until results are back on 4/20, try scapular series    PT Home Exercise Plan Access Code: [email or web address]    Consulted and Agree with Plan of Care Patient           Patient will benefit from skilled therapeutic  intervention in order to improve the following deficits and impairments:  Decreased range of motion,Decreased strength,Impaired UE functional use,Pain,Increased fascial restricitons,Decreased knowledge of use of DME,Decreased scar mobility,Postural dysfunction,Increased edema  Visit Diagnosis: Breast pain  Aftercare following surgery for neoplasm  Disorder of the skin and subcutaneous tissue related to radiation, unspecified  Muscle weakness (generalized)  Malignant neoplasm of lower-outer quadrant of right female breast, unspecified estrogen receptor status ([place])     Problem List Patient Active Problem List   Diagnosis Date Noted  . Breast pain 08/21/2020  . Breast implant capsular contracture 08/21/2020  . Acquired hypothyroidism 11/21/2017  . Weight gain 11/21/2017  . Gastroesophageal reflux disease 11/21/2017  . Left foot pain 09/19/2016  . Breast cancer in female [facility]) 07/23/2015  . Breast cancer, right breast ([place]) 06/04/2015  . Fever 09/14/2014  . Leukopenia 09/14/2014  . Flu-like symptoms 09/14/2014  . Acute bronchitis 09/14/2014  . Breast cancer of lower-outer quadrant of right female breast ([place]) 04/17/2014  . DEPRESSION 08/03/2007  . SWELLING MASS OR LUMP IN HEAD AND NECK 08/03/2007    Stark Bray 09/15/2020, 5:05 PM  [place], [place], [address] Phone: [phone]   Fax:   [phone]  Name: Olivia Acosta MRN: [id number] Date of Birth: 1960/12/18

## 2020-09-16 NOTE — Progress Notes (Signed)
Spoke with patient and she is aware of results.

## 2020-09-17 ENCOUNTER — Encounter: Payer: Self-pay | Admitting: Rehabilitation

## 2020-09-17 ENCOUNTER — Other Ambulatory Visit: Payer: Self-pay

## 2020-09-17 ENCOUNTER — Ambulatory Visit: Payer: Commercial Managed Care - PPO | Admitting: Rehabilitation

## 2020-09-17 DIAGNOSIS — Z483 Aftercare following surgery for neoplasm: Secondary | ICD-10-CM

## 2020-09-17 DIAGNOSIS — L599 Disorder of the skin and subcutaneous tissue related to radiation, unspecified: Secondary | ICD-10-CM

## 2020-09-17 DIAGNOSIS — C50511 Malignant neoplasm of lower-outer quadrant of right female breast: Secondary | ICD-10-CM

## 2020-09-17 DIAGNOSIS — M6281 Muscle weakness (generalized): Secondary | ICD-10-CM

## 2020-09-17 DIAGNOSIS — N644 Mastodynia: Secondary | ICD-10-CM | POA: Diagnosis not present

## 2020-09-17 NOTE — Therapy (Signed)
[place], [place], [address] Phone: [phone]   Fax:  [phone]  Physical Therapy Treatment  Patient Details  Name: Olivia Acosta MRN: [id number] Date of Birth: 08/17/1960 Referring Provider (PT): Dillingham   Encounter Date: 09/17/2020   PT End of Session - 09/17/20 1557    Visit Number 3    Number of Visits 9    Date for PT Re-Evaluation 10/06/20    PT Start Time 1502    PT Stop Time 1556    PT Time Calculation (min) 54 min    Activity Tolerance Patient tolerated treatment well    Behavior During Therapy [facility] for tasks assessed/performed           Past Medical History:  Diagnosis Date  . Arthritis    fingers stiff  . Breast cancer ([place])    R breast- 2015  . Bronchitis 2016   treated /w IV antibiotics, admitted for 2 nights [facility]  . GERD (gastroesophageal reflux disease)   . Neuropathy    both feet since chemotherapy  . Thyroid disease   . UTI (urinary tract infection)     Past Surgical History:  Procedure Laterality Date  . BREAST RECONSTRUCTION Bilateral 04/17/2014  . BREAST RECONSTRUCTION Right 07/23/2015   Procedure: RIGHT CHEST WOUND DEBRIDEMENT, RIGHT BREAST RECONSTRUCTION WITH SILICONE GEL IMPLANT;  Surgeon: Crissie Reese, MD;  Location: [place];  Service: Plastics;  Laterality: Right;  . BREAST RECONSTRUCTION WITH PLACEMENT OF TISSUE EXPANDER AND FLEX HD (ACELLULAR HYDRATED DERMIS) Bilateral 04/17/2014   Procedure: BILATERAL BREAST RECONSTRUCTION WITH PLACEMENT OF TISSUE EXPANDER AND FLEX HD (ACELLULAR HYDRATED DERMIS);  Surgeon: Crissie Reese, MD;  Location: [place];  Service: Plastics;  Laterality: Bilateral;  . COMPLETE MASTECTOMY W/ SENTINEL NODE BIOPSY Bilateral 04/17/2014   NIPPLE SPARING RECONSTRUCTION  . LATISSIMUS FLAP TO BREAST Right 07/23/2015   Procedure: LATISSIMUS FLAP TO RIGHT CHEST;  Surgeon: Crissie Reese, MD;  Location: [place];  Service: Plastics;   Laterality: Right;  . MASTECTOMY W/ SENTINEL NODE BIOPSY Bilateral 04/17/2014   Procedure: BILATERAL NIPPLE SPARING MASTECTOMY WITH SENTINEL LYMPH NODE BIOPSY;  Surgeon: Excell Seltzer, MD;  Location: [place];  Service: General;  Laterality: Bilateral;  . PORT A CATH REVISION Right 05/27/2014   Procedure: PORT A CATH REVISION;  Surgeon: Excell Seltzer, MD;  Location: [place];  Service: General;  Laterality: Right;  . PORT-A-CATH REMOVAL Right 06/04/2015   Procedure: REMOVAL PORT-A-CATH;  Surgeon: Crissie Reese, MD;  [place];  Service: Plastics;  Laterality: Right;  . PORTACATH PLACEMENT Left 05/27/2014   Procedure: INSERTION PORT-A-CATH;  Surgeon: Excell Seltzer, MD;  Location: [place];  Service: General;  Laterality: Left;  . REMOVAL OF TISSUE EXPANDER AND PLACEMENT OF IMPLANT Bilateral 06/04/2015   Procedure: REMOVAL OF BILATERAL TISSUE EXPANDER AND DELAYED BREAST RECONSTRUCTION PLACEMENT OF IMPLANTS;  Surgeon: Crissie Reese, MD;  Location: [place];  Service: Plastics;  Laterality: Bilateral;  . SALIVARY STONE REMOVAL  2009  . stricture of the uretha  1975  . TONSILLECTOMY    . TONSILLECTOMY AND ADENOIDECTOMY  1974  . URETHRAL STRICTURE DILATATION      There were no vitals filed for this visit.   Subjective Assessment - 09/17/20 1503    Subjective It felt good to work on it last time    Pertinent History double mastectomy 04/2014, chemo started Jan 2016, radiation June 2016, reconstructive surgery 05/2015, the tissue gave way and did not heal  on R side so pt had lat dorsi flap procedure in 07/2015, diagnosed with Hashimotos disease 2019    Patient Stated Goals decrease back spasms and R breast pain                             [facility] Adult PT Treatment/Exercise - 09/17/20 0001      Exercises   Exercises Other Exercises    Other Exercises  spine along foam roll alternating overhead flexion x 5 bil, then pro/ret x 10, heel  reach x 5 bil, diagonal arms alternating x 5      Shoulder Exercises: Supine   Protraction Both    Protraction Weight (lbs) 4#    Horizontal ABduction Both;15 reps    Theraband Level (Shoulder Horizontal ABduction) Level 2 (Red)    External Rotation Both;15 reps    Theraband Level (Shoulder External Rotation) Level 2 (Red)    Diagonals Both;15 reps    Theraband Level (Shoulder Diagonals) Level 2 (Red)      Manual Therapy   Soft tissue mobilization in supine to the axilla with arm overhead position and in sidelying to latissimus body and scar tissue mobilization in various cross scar directions.    Passive ROM to the right shoulder into flexion                       PT Long Term Goals - 09/08/20 0859      PT LONG TERM GOAL #1   Title Pt will report a 75% improvement in back pain on R side when reaching for objects to allow improved comfort.    Time 4    Period Weeks    Status New    Target Date 10/06/20      PT LONG TERM GOAL #2   Title Pt will report a 75% improvement in R breast/chest pain throughout the day to allow improved comfort.    Time 4    Period Weeks    Status New    Target Date 10/06/20      PT LONG TERM GOAL #3   Title Pt will be independent in a home exercise program for continued strengthening and stretching.    Time 4    Period Weeks    Status New    Target Date 10/06/20                 Plan - 09/17/20 1557    Clinical Impression Statement Continued with latissimus flap post operative strengthening and added foam roll series both tolerated well.  Pt noting increased thoracic region tension on the Rt side in supine lying on the foam roll.    PT Frequency 2x / week    PT Duration 4 weeks    PT Treatment/Interventions ADLs/Self Care Home Management;DME Instruction;Therapeutic exercise;Patient/family education;Manual techniques;Manual lymph drainage;Passive range of motion;Orthotic Fit/Training;Compression bandaging;Scar  mobilization;Taping    PT Next Visit Plan begin STM to R lat and pec, stretching for pec and lats, hold off on manual therapy for lump until results are back on 4/20    Consulted and Agree with Plan of Care Patient           Patient will benefit from skilled therapeutic intervention in order to improve the following deficits and impairments:     Visit Diagnosis: Breast pain  Aftercare following surgery for neoplasm  Disorder of the skin and subcutaneous tissue related to radiation, unspecified  Muscle  weakness (generalized)  Malignant neoplasm of lower-outer quadrant of right female breast, unspecified estrogen receptor status ([place])     Problem List Patient Active Problem List   Diagnosis Date Noted  . Breast pain 08/21/2020  . Breast implant capsular contracture 08/21/2020  . Acquired hypothyroidism 11/21/2017  . Weight gain 11/21/2017  . Gastroesophageal reflux disease 11/21/2017  . Left foot pain 09/19/2016  . Breast cancer in female [facility]) 07/23/2015  . Breast cancer, right breast ([place]) 06/04/2015  . Fever 09/14/2014  . Leukopenia 09/14/2014  . Flu-like symptoms 09/14/2014  . Acute bronchitis 09/14/2014  . Breast cancer of lower-outer quadrant of right female breast ([place]) 04/17/2014  . DEPRESSION 08/03/2007  . SWELLING MASS OR LUMP IN HEAD AND NECK 08/03/2007    Stark Bray 09/17/2020, 3:59 PM  [place], [place], [address] Phone: [phone]   Fax:  [phone]  Name: Olivia Acosta MRN: [id number] Date of Birth: 14-Feb-1961

## 2020-09-17 NOTE — Patient Instructions (Signed)
Access Code: [email or web address] URL: [email or web address]: 04/14/2022Prepared by: Marcene Brawn TevisExercises  Standing Row with Anchored Resistance - 1 x daily - 3-4 x weekly - 10 reps - 1-3 sets  Supine Shoulder Horizontal Abduction with Resistance - 1 x daily - 7 x weekly - 10 reps - 1-3 sets  Supine Shoulder External Rotation with Resistance - 1 x daily - 7 x weekly - 10 reps - 1-3 sets  Supine PNF D2 Flexion with Resistance - 1 x daily - 7 x weekly - 10 reps - 1-3 sets

## 2020-09-22 ENCOUNTER — Telehealth: Payer: Commercial Managed Care - PPO | Admitting: Plastic Surgery

## 2020-09-22 ENCOUNTER — Encounter: Payer: Self-pay | Admitting: Physical Therapy

## 2020-09-22 ENCOUNTER — Other Ambulatory Visit: Payer: Self-pay

## 2020-09-22 ENCOUNTER — Telehealth: Payer: Self-pay | Admitting: Family Medicine

## 2020-09-22 ENCOUNTER — Ambulatory Visit: Payer: Commercial Managed Care - PPO | Admitting: Physical Therapy

## 2020-09-22 DIAGNOSIS — M6281 Muscle weakness (generalized): Secondary | ICD-10-CM

## 2020-09-22 DIAGNOSIS — Z483 Aftercare following surgery for neoplasm: Secondary | ICD-10-CM

## 2020-09-22 DIAGNOSIS — N644 Mastodynia: Secondary | ICD-10-CM | POA: Diagnosis not present

## 2020-09-22 DIAGNOSIS — L599 Disorder of the skin and subcutaneous tissue related to radiation, unspecified: Secondary | ICD-10-CM

## 2020-09-22 NOTE — Therapy (Signed)
[place], [place], [address] Phone: [phone]   Fax:  [phone]  Physical Therapy Treatment  Patient Details  Name: Olivia Acosta MRN: [id number] Date of Birth: 12/22/1960 Referring Provider (PT): Dillingham   Encounter Date: 09/22/2020   PT End of Session - 09/22/20 0951    Visit Number 4    Number of Visits 9    Date for PT Re-Evaluation 10/06/20    PT Start Time 0856    PT Stop Time 0953    PT Time Calculation (min) 57 min    Activity Tolerance Patient tolerated treatment well    Behavior During Therapy [facility] for tasks assessed/performed           Past Medical History:  Diagnosis Date  . Arthritis    fingers stiff  . Breast cancer ([place])    R breast- 2015  . Bronchitis 2016   treated /w IV antibiotics, admitted for 2 nights [facility]  . GERD (gastroesophageal reflux disease)   . Neuropathy    both feet since chemotherapy  . Thyroid disease   . UTI (urinary tract infection)     Past Surgical History:  Procedure Laterality Date  . BREAST RECONSTRUCTION Bilateral 04/17/2014  . BREAST RECONSTRUCTION Right 07/23/2015   Procedure: RIGHT CHEST WOUND DEBRIDEMENT, RIGHT BREAST RECONSTRUCTION WITH SILICONE GEL IMPLANT;  Surgeon: Crissie Reese, MD;  Location: [place];  Service: Plastics;  Laterality: Right;  . BREAST RECONSTRUCTION WITH PLACEMENT OF TISSUE EXPANDER AND FLEX HD (ACELLULAR HYDRATED DERMIS) Bilateral 04/17/2014   Procedure: BILATERAL BREAST RECONSTRUCTION WITH PLACEMENT OF TISSUE EXPANDER AND FLEX HD (ACELLULAR HYDRATED DERMIS);  Surgeon: Crissie Reese, MD;  Location: [place];  Service: Plastics;  Laterality: Bilateral;  . COMPLETE MASTECTOMY W/ SENTINEL NODE BIOPSY Bilateral 04/17/2014   NIPPLE SPARING RECONSTRUCTION  . LATISSIMUS FLAP TO BREAST Right 07/23/2015   Procedure: LATISSIMUS FLAP TO RIGHT CHEST;  Surgeon: Crissie Reese, MD;  Location: [place];  Service: Plastics;   Laterality: Right;  . MASTECTOMY W/ SENTINEL NODE BIOPSY Bilateral 04/17/2014   Procedure: BILATERAL NIPPLE SPARING MASTECTOMY WITH SENTINEL LYMPH NODE BIOPSY;  Surgeon: Excell Seltzer, MD;  Location: [place];  Service: General;  Laterality: Bilateral;  . PORT A CATH REVISION Right 05/27/2014   Procedure: PORT A CATH REVISION;  Surgeon: Excell Seltzer, MD;  Location: [place];  Service: General;  Laterality: Right;  . PORT-A-CATH REMOVAL Right 06/04/2015   Procedure: REMOVAL PORT-A-CATH;  Surgeon: Crissie Reese, MD;  Location: [place];  Service: Plastics;  Laterality: Right;  . PORTACATH PLACEMENT Left 05/27/2014   Procedure: INSERTION PORT-A-CATH;  Surgeon: Excell Seltzer, MD;  Location: [place];  Service: General;  Laterality: Left;  . REMOVAL OF TISSUE EXPANDER AND PLACEMENT OF IMPLANT Bilateral 06/04/2015   Procedure: REMOVAL OF BILATERAL TISSUE EXPANDER AND DELAYED BREAST RECONSTRUCTION PLACEMENT OF IMPLANTS;  Surgeon: Crissie Reese, MD;  Location: [place];  Service: Plastics;  Laterality: Bilateral;  . SALIVARY STONE REMOVAL  2009  . stricture of the uretha  1975  . TONSILLECTOMY    . TONSILLECTOMY AND ADENOIDECTOMY  1974  . URETHRAL STRICTURE DILATATION      There were no vitals filed for this visit.   Subjective Assessment - 09/22/20 0859    Subjective Exercises are going good. I went to a pilates class last night and it went well. We did a lot of chest work and I got cramps in my back.    Pertinent History  double mastectomy 04/2014, chemo started Jan 2016, radiation June 2016, reconstructive surgery 05/2015, the tissue gave way and did not heal on R side so pt had lat dorsi flap procedure in 07/2015, diagnosed with Hashimotos disease 2019    Patient Stated Goals decrease back spasms and R breast pain    Currently in Pain? No/denies    Pain Score 0-No pain                             [facility] Adult PT Treatment/Exercise - 09/22/20  0001      Exercises   Other Exercises  spine along foam roll alternating overhead flexion x 10 bil, then pro/ret x 10, heel reach x 10 bil, diagonal arms alternating x 10      Shoulder Exercises: Supine   Protraction Both;10 reps    Protraction Weight (lbs) 4#    Horizontal ABduction Both;15 reps    Theraband Level (Shoulder Horizontal ABduction) Level 2 (Red)    External Rotation Both;15 reps    Theraband Level (Shoulder External Rotation) Level 2 (Red)    Diagonals Both;15 reps    Theraband Level (Shoulder Diagonals) Level 2 (Red)      Manual Therapy   Soft tissue mobilization in supine to the axilla with arm overhead position and in sidelying to latissimus body and scar tissue mobilization in various cross scar directions.    Passive ROM to the right shoulder into flexion                       PT Long Term Goals - 09/08/20 0859      PT LONG TERM GOAL #1   Title Pt will report a 75% improvement in back pain on R side when reaching for objects to allow improved comfort.    Time 4    Period Weeks    Status New    Target Date 10/06/20      PT LONG TERM GOAL #2   Title Pt will report a 75% improvement in R breast/chest pain throughout the day to allow improved comfort.    Time 4    Period Weeks    Status New    Target Date 10/06/20      PT LONG TERM GOAL #3   Title Pt will be independent in a home exercise program for continued strengthening and stretching.    Time 4    Period Weeks    Status New    Target Date 10/06/20                 Plan - 09/22/20 0951    Clinical Impression Statement Continued instructing pt in strengthening exercises for UEs post lat flap surgery. Pt reports she has not tried the foam roll exercises at home but has been doing the band exercises. She recently attended a pilates class but did have back spasms during the class. Spent time on soft tissue mobilization to help reduced tightness and decrease muscle spasms. Pt reported  feeling good at end of session.    PT Frequency 2x / week    PT Duration 4 weeks    PT Treatment/Interventions ADLs/Self Care Home Management;DME Instruction;Therapeutic exercise;Patient/family education;Manual techniques;Manual lymph drainage;Passive range of motion;Orthotic Fit/Training;Compression bandaging;Scar mobilization;Taping    PT Next Visit Plan cont STM to R lat and pec, stretching for pec and lats, hold off on manual therapy for lump until results are back on 4/20  PT Home Exercise Plan Access Code: [email or web address]    Consulted and Agree with Plan of Care Patient           Patient will benefit from skilled therapeutic intervention in order to improve the following deficits and impairments:  Decreased range of motion,Decreased strength,Impaired UE functional use,Pain,Increased fascial restricitons,Decreased knowledge of use of DME,Decreased scar mobility,Postural dysfunction,Increased edema  Visit Diagnosis: Aftercare following surgery for neoplasm  Disorder of the skin and subcutaneous tissue related to radiation, unspecified  Muscle weakness (generalized)  Breast pain     Problem List Patient Active Problem List   Diagnosis Date Noted  . Breast pain 08/21/2020  . Breast implant capsular contracture 08/21/2020  . Acquired hypothyroidism 11/21/2017  . Weight gain 11/21/2017  . Gastroesophageal reflux disease 11/21/2017  . Left foot pain 09/19/2016  . Breast cancer in female [facility]) 07/23/2015  . Breast cancer, right breast ([place]) 06/04/2015  . Fever 09/14/2014  . Leukopenia 09/14/2014  . Flu-like symptoms 09/14/2014  . Acute bronchitis 09/14/2014  . Breast cancer of lower-outer quadrant of right female breast ([place]) 04/17/2014  . DEPRESSION 08/03/2007  . SWELLING MASS OR LUMP IN HEAD AND NECK 08/03/2007    Allyson Sabal [facility] 09/22/2020, 9:56 AM  [place], [place], [address] Phone:  [phone]   Fax:  [phone]  Name: Charrisse Masley MRN: [id number] Date of Birth: 1960/09/19  Manus Gunning, PT 09/22/20 9:56 AM

## 2020-09-22 NOTE — Telephone Encounter (Signed)
Patient dropped off form for Dr. Volanda Napoleon to sign from her 09-14-20 physical. Placed in providers folder

## 2020-09-23 ENCOUNTER — Ambulatory Visit
Admission: RE | Admit: 2020-09-23 | Discharge: 2020-09-23 | Disposition: A | Discharge status: Home / Self Care | Payer: Commercial Managed Care - PPO | Source: Ambulatory Visit | Attending: Plastic Surgery | Admitting: Plastic Surgery

## 2020-09-23 DIAGNOSIS — C50511 Malignant neoplasm of lower-outer quadrant of right female breast: Secondary | ICD-10-CM

## 2020-09-23 DIAGNOSIS — Z170 Estrogen receptor positive status [ER+]: Secondary | ICD-10-CM

## 2020-09-23 DIAGNOSIS — N644 Mastodynia: Secondary | ICD-10-CM

## 2020-09-23 DIAGNOSIS — T8544XA Capsular contracture of breast implant, initial encounter: Secondary | ICD-10-CM

## 2020-09-23 NOTE — Telephone Encounter (Signed)
Paperwork was rec'd and completed. Spoke with patient and advised completed paperwork is at front desk.

## 2020-09-24 ENCOUNTER — Ambulatory Visit: Payer: Commercial Managed Care - PPO | Admitting: Physical Therapy

## 2020-09-24 ENCOUNTER — Encounter: Payer: Self-pay | Admitting: Physical Therapy

## 2020-09-24 ENCOUNTER — Other Ambulatory Visit: Payer: Self-pay

## 2020-09-24 DIAGNOSIS — Z483 Aftercare following surgery for neoplasm: Secondary | ICD-10-CM

## 2020-09-24 DIAGNOSIS — N644 Mastodynia: Secondary | ICD-10-CM | POA: Diagnosis not present

## 2020-09-24 DIAGNOSIS — L599 Disorder of the skin and subcutaneous tissue related to radiation, unspecified: Secondary | ICD-10-CM

## 2020-09-24 NOTE — Therapy (Signed)
[place], [place], [address] Phone: [phone]   Fax:  [phone]  Physical Therapy Treatment  Patient Details  Name: Olivia Acosta MRN: [id number] Date of Birth: 1961-05-08 Referring Provider (PT): Dillingham   Encounter Date: 09/24/2020   PT End of Session - 09/24/20 1558    Visit Number 5    Number of Visits 9    Date for PT Re-Evaluation 10/06/20    PT Start Time 1503    PT Stop Time 1555    PT Time Calculation (min) 52 min    Activity Tolerance Patient tolerated treatment well    Behavior During Therapy [facility] for tasks assessed/performed           Past Medical History:  Diagnosis Date  . Arthritis    fingers stiff  . Breast cancer ([place])    R breast- 2015  . Bronchitis 2016   treated /w IV antibiotics, admitted for 2 nights [facility]  . GERD (gastroesophageal reflux disease)   . Neuropathy    both feet since chemotherapy  . Thyroid disease   . UTI (urinary tract infection)     Past Surgical History:  Procedure Laterality Date  . BREAST RECONSTRUCTION Bilateral 04/17/2014  . BREAST RECONSTRUCTION Right 07/23/2015   Procedure: RIGHT CHEST WOUND DEBRIDEMENT, RIGHT BREAST RECONSTRUCTION WITH SILICONE GEL IMPLANT;  Surgeon: Crissie Reese, MD;  Location: [place];  Service: Plastics;  Laterality: Right;  . BREAST RECONSTRUCTION WITH PLACEMENT OF TISSUE EXPANDER AND FLEX HD (ACELLULAR HYDRATED DERMIS) Bilateral 04/17/2014   Procedure: BILATERAL BREAST RECONSTRUCTION WITH PLACEMENT OF TISSUE EXPANDER AND FLEX HD (ACELLULAR HYDRATED DERMIS);  Surgeon: Crissie Reese, MD;  Location: [place];  Service: Plastics;  Laterality: Bilateral;  . COMPLETE MASTECTOMY W/ SENTINEL NODE BIOPSY Bilateral 04/17/2014   NIPPLE SPARING RECONSTRUCTION  . LATISSIMUS FLAP TO BREAST Right 07/23/2015   Procedure: LATISSIMUS FLAP TO RIGHT CHEST;  Surgeon: Crissie Reese, MD;  Location: [place];  Service: Plastics;   Laterality: Right;  . MASTECTOMY W/ SENTINEL NODE BIOPSY Bilateral 04/17/2014   Procedure: BILATERAL NIPPLE SPARING MASTECTOMY WITH SENTINEL LYMPH NODE BIOPSY;  Surgeon: Excell Seltzer, MD;  Location: [place];  Service: General;  Laterality: Bilateral;  . PORT A CATH REVISION Right 05/27/2014   Procedure: PORT A CATH REVISION;  Surgeon: Excell Seltzer, MD;  Location: [place];  Service: General;  Laterality: Right;  . PORT-A-CATH REMOVAL Right 06/04/2015   Procedure: REMOVAL PORT-A-CATH;  Surgeon: Crissie Reese, MD;  Location: [place];  Service: Plastics;  Laterality: Right;  . PORTACATH PLACEMENT Left 05/27/2014   Procedure: INSERTION PORT-A-CATH;  Surgeon: Excell Seltzer, MD;  Location: [place];  Service: General;  Laterality: Left;  . REMOVAL OF TISSUE EXPANDER AND PLACEMENT OF IMPLANT Bilateral 06/04/2015   Procedure: REMOVAL OF BILATERAL TISSUE EXPANDER AND DELAYED BREAST RECONSTRUCTION PLACEMENT OF IMPLANTS;  Surgeon: Crissie Reese, MD;  Location: [place];  Service: Plastics;  Laterality: Bilateral;  . SALIVARY STONE REMOVAL  2009  . stricture of the uretha  1975  . TONSILLECTOMY    . TONSILLECTOMY AND ADENOIDECTOMY  1974  . URETHRAL STRICTURE DILATATION      There were no vitals filed for this visit.   Subjective Assessment - 09/24/20 1516    Subjective The lump on my chest is my implant which has moved up. They did the ultrasound.    Pertinent History double mastectomy 04/2014, chemo started Jan 2016, radiation June 2016, reconstructive surgery 05/2015, the  tissue gave way and did not heal on R side so pt had lat dorsi flap procedure in 07/2015, diagnosed with Hashimotos disease 2019    Patient Stated Goals decrease back spasms and R breast pain    Currently in Pain? No/denies    Pain Score 0-No pain                             [facility] Adult PT Treatment/Exercise - 09/24/20 0001      Shoulder Exercises: Pulleys   Flexion 2  minutes    ABduction 2 minutes      Shoulder Exercises: Therapy Ball   Flexion Both;10 reps   with stretch at end range   ABduction Right;10 reps   with stretch at end range     Manual Therapy   Soft tissue mobilization in supine to the axilla with arm overhead position and in sidelying using cocoa butter to latissimus body and scar tissue mobilization in various cross scar directions.    Passive ROM to the right shoulder into flexion and abduction                       PT Long Term Goals - 09/08/20 0859      PT LONG TERM GOAL #1   Title Pt will report a 75% improvement in back pain on R side when reaching for objects to allow improved comfort.    Time 4    Period Weeks    Status New    Target Date 10/06/20      PT LONG TERM GOAL #2   Title Pt will report a 75% improvement in R breast/chest pain throughout the day to allow improved comfort.    Time 4    Period Weeks    Status New    Target Date 10/06/20      PT LONG TERM GOAL #3   Title Pt will be independent in a home exercise program for continued strengthening and stretching.    Time 4    Period Weeks    Status New    Target Date 10/06/20                 Plan - 09/24/20 1558    Clinical Impression Statement Pt recently had an ultrasound on her R breast that showed her implant was out of place which is causing pt's left breat pain. Will wait to see what her doctor recommends as far as treatment and if massage for possible encapsulation would be appropriate. Continued with AAROM and manual therapy to R axilla to decrease discomfort. Also worked on scar mobilization and soft tissue mobilization to latissimus to help decrease tightness and decrease muscle spasms.    PT Frequency 2x / week    PT Duration 4 weeks    PT Treatment/Interventions ADLs/Self Care Home Management;DME Instruction;Therapeutic exercise;Patient/family education;Manual techniques;Manual lymph drainage;Passive range of motion;Orthotic  Fit/Training;Compression bandaging;Scar mobilization;Taping    PT Next Visit Plan cont STM to R lat and pec, stretching for pec and lats, hold off on manual therapy for R breast until pt meets with Dr on 4/27    PT Home Exercise Plan Access Code: [email or web address]    Consulted and Agree with Plan of Care Patient           Patient will benefit from skilled therapeutic intervention in order to improve the following deficits and impairments:  Decreased range of motion,Decreased strength,Impaired UE functional  use,Pain,Increased fascial restricitons,Decreased knowledge of use of DME,Decreased scar mobility,Postural dysfunction,Increased edema  Visit Diagnosis: Aftercare following surgery for neoplasm  Disorder of the skin and subcutaneous tissue related to radiation, unspecified     Problem List Patient Active Problem List   Diagnosis Date Noted  . Breast pain 08/21/2020  . Breast implant capsular contracture 08/21/2020  . Acquired hypothyroidism 11/21/2017  . Weight gain 11/21/2017  . Gastroesophageal reflux disease 11/21/2017  . Left foot pain 09/19/2016  . Breast cancer in female [facility]) 07/23/2015  . Breast cancer, right breast ([place]) 06/04/2015  . Fever 09/14/2014  . Leukopenia 09/14/2014  . Flu-like symptoms 09/14/2014  . Acute bronchitis 09/14/2014  . Breast cancer of lower-outer quadrant of right female breast ([place]) 04/17/2014  . DEPRESSION 08/03/2007  . SWELLING MASS OR LUMP IN HEAD AND NECK 08/03/2007    Allyson Sabal [facility] 09/24/2020, 4:01 PM  [place] [place], [place], [address] Phone: [phone]   Fax:  [phone]  Name: Olivia Acosta MRN: [id number] Date of Birth: 1960/08/25  Manus Gunning, PT 09/24/20 4:01 PM

## 2020-09-29 ENCOUNTER — Encounter: Payer: Self-pay | Admitting: Physical Therapy

## 2020-09-29 ENCOUNTER — Encounter: Payer: Self-pay | Admitting: Plastic Surgery

## 2020-09-29 ENCOUNTER — Ambulatory Visit: Payer: Commercial Managed Care - PPO | Admitting: Physical Therapy

## 2020-09-29 ENCOUNTER — Other Ambulatory Visit: Payer: Self-pay

## 2020-09-29 ENCOUNTER — Telehealth (INDEPENDENT_AMBULATORY_CARE_PROVIDER_SITE_OTHER): Payer: Commercial Managed Care - PPO | Admitting: Plastic Surgery

## 2020-09-29 DIAGNOSIS — T8544XA Capsular contracture of breast implant, initial encounter: Secondary | ICD-10-CM

## 2020-09-29 DIAGNOSIS — C50511 Malignant neoplasm of lower-outer quadrant of right female breast: Secondary | ICD-10-CM | POA: Diagnosis not present

## 2020-09-29 DIAGNOSIS — L599 Disorder of the skin and subcutaneous tissue related to radiation, unspecified: Secondary | ICD-10-CM

## 2020-09-29 DIAGNOSIS — Z170 Estrogen receptor positive status [ER+]: Secondary | ICD-10-CM | POA: Diagnosis not present

## 2020-09-29 DIAGNOSIS — Z483 Aftercare following surgery for neoplasm: Secondary | ICD-10-CM

## 2020-09-29 DIAGNOSIS — N644 Mastodynia: Secondary | ICD-10-CM | POA: Diagnosis not present

## 2020-09-29 NOTE — Therapy (Signed)
[place], [place], [address] Phone: [phone]   Fax:  [phone]  Physical Therapy Treatment  Patient Details  Name: Olivia Acosta MRN: [id number] Date of Birth: April 11, 1961 Referring Provider (PT): Dillingham   Encounter Date: 09/29/2020   PT End of Session - 09/29/20 0851    Visit Number 6    Number of Visits 9    Date for PT Re-Evaluation 10/06/20    PT Start Time 0804    PT Stop Time 0852    PT Time Calculation (min) 48 min    Activity Tolerance Patient tolerated treatment well    Behavior During Therapy [facility] for tasks assessed/performed           Past Medical History:  Diagnosis Date  . Arthritis    fingers stiff  . Breast cancer ([place])    R breast- 2015  . Bronchitis 2016   treated /w IV antibiotics, admitted for 2 nights [facility]  . GERD (gastroesophageal reflux disease)   . Neuropathy    both feet since chemotherapy  . Thyroid disease   . UTI (urinary tract infection)     Past Surgical History:  Procedure Laterality Date  . BREAST RECONSTRUCTION Bilateral 04/17/2014  . BREAST RECONSTRUCTION Right 07/23/2015   Procedure: RIGHT CHEST WOUND DEBRIDEMENT, RIGHT BREAST RECONSTRUCTION WITH SILICONE GEL IMPLANT;  Surgeon: Crissie Reese, MD;  Location: [place];  Service: Plastics;  Laterality: Right;  . BREAST RECONSTRUCTION WITH PLACEMENT OF TISSUE EXPANDER AND FLEX HD (ACELLULAR HYDRATED DERMIS) Bilateral 04/17/2014   Procedure: BILATERAL BREAST RECONSTRUCTION WITH PLACEMENT OF TISSUE EXPANDER AND FLEX HD (ACELLULAR HYDRATED DERMIS);  Surgeon: Crissie Reese, MD;  Location: [place];  Service: Plastics;  Laterality: Bilateral;  . COMPLETE MASTECTOMY W/ SENTINEL NODE BIOPSY Bilateral 04/17/2014   NIPPLE SPARING RECONSTRUCTION  . LATISSIMUS FLAP TO BREAST Right 07/23/2015   Procedure: LATISSIMUS FLAP TO RIGHT CHEST;  Surgeon: Crissie Reese, MD;  Location: [place];  Service: Plastics;   Laterality: Right;  . MASTECTOMY W/ SENTINEL NODE BIOPSY Bilateral 04/17/2014   Procedure: BILATERAL NIPPLE SPARING MASTECTOMY WITH SENTINEL LYMPH NODE BIOPSY;  Surgeon: Excell Seltzer, MD;  Location: [place];  Service: General;  Laterality: Bilateral;  . PORT A CATH REVISION Right 05/27/2014   Procedure: PORT A CATH REVISION;  Surgeon: Excell Seltzer, MD;  Location: [place];  Service: General;  Laterality: Right;  . PORT-A-CATH REMOVAL Right 06/04/2015   Procedure: REMOVAL PORT-A-CATH;  Surgeon: Crissie Reese, MD;  Location: [place];  Service: Plastics;  Laterality: Right;  . PORTACATH PLACEMENT Left 05/27/2014   Procedure: INSERTION PORT-A-CATH;  Surgeon: Excell Seltzer, MD;  Location: [place];  Service: General;  Laterality: Left;  . REMOVAL OF TISSUE EXPANDER AND PLACEMENT OF IMPLANT Bilateral 06/04/2015   Procedure: REMOVAL OF BILATERAL TISSUE EXPANDER AND DELAYED BREAST RECONSTRUCTION PLACEMENT OF IMPLANTS;  Surgeon: Crissie Reese, MD;  Location: [place];  Service: Plastics;  Laterality: Bilateral;  . SALIVARY STONE REMOVAL  2009  . stricture of the uretha  1975  . TONSILLECTOMY    . TONSILLECTOMY AND ADENOIDECTOMY  1974  . URETHRAL STRICTURE DILATATION      There were no vitals filed for this visit.   Subjective Assessment - 09/29/20 0816    Subjective The shoulders feel good. Everything is feeling good. My appointment with the Dr is today.    Pertinent History double mastectomy 04/2014, chemo started Jan 2016, radiation June 2016, reconstructive surgery 05/2015, the tissue  gave way and did not heal on R side so pt had lat dorsi flap procedure in 07/2015, diagnosed with Hashimotos disease 2019    Patient Stated Goals decrease back spasms and R breast pain    Currently in Pain? No/denies    Pain Score 0-No pain                             [facility] Adult PT Treatment/Exercise - 09/29/20 0001      Shoulder Exercises: Pulleys    Flexion 2 minutes    ABduction 2 minutes      Shoulder Exercises: Therapy Ball   Flexion Both;10 reps   with stretch at end range   ABduction Right;10 reps   with stretch at end range     Manual Therapy   Soft tissue mobilization in supine to the axilla with arm overhead position and in sidelying using cocoa butter to latissimus body and scar tissue mobilization in various cross scar directions.    Passive ROM to the right shoulder into flexion and abduction                       PT Long Term Goals - 09/08/20 0859      PT LONG TERM GOAL #1   Title Pt will report a 75% improvement in back pain on R side when reaching for objects to allow improved comfort.    Time 4    Period Weeks    Status New    Target Date 10/06/20      PT LONG TERM GOAL #2   Title Pt will report a 75% improvement in R breast/chest pain throughout the day to allow improved comfort.    Time 4    Period Weeks    Status New    Target Date 10/06/20      PT LONG TERM GOAL #3   Title Pt will be independent in a home exercise program for continued strengthening and stretching.    Time 4    Period Weeks    Status New    Target Date 10/06/20                 Plan - 09/29/20 0854    Clinical Impression Statement Pt will see the surgeon today about her implant which is out of place. Will see if she is able to do PT to help with scar tissue. Continued with AAROM and PROM to R shoulder today. R shoulder ROM has improved greatly since her evaluation. Continued with soft tissue mobilization and scar mobilization to lat scar to help decrease spasms and tightness.    PT Frequency 2x / week    PT Duration 4 weeks    PT Treatment/Interventions ADLs/Self Care Home Management;DME Instruction;Therapeutic exercise;Patient/family education;Manual techniques;Manual lymph drainage;Passive range of motion;Orthotic Fit/Training;Compression bandaging;Scar mobilization;Taping    PT Next Visit Plan assess goals, add  goal for breast scar tissue if dr Sherrine Maples this if not possible d/c?, cont STM to R lat and pec, stretching for pec and lats,    PT Home Exercise Plan Access Code: [email or web address]    Consulted and Agree with Plan of Care Patient           Patient will benefit from skilled therapeutic intervention in order to improve the following deficits and impairments:  Decreased range of motion,Decreased strength,Impaired UE functional use,Pain,Increased fascial restricitons,Decreased knowledge of use of DME,Decreased scar mobility,Postural dysfunction,Increased edema  Visit Diagnosis: Aftercare following surgery for neoplasm  Disorder of the skin and subcutaneous tissue related to radiation, unspecified     Problem List Patient Active Problem List   Diagnosis Date Noted  . Breast pain 08/21/2020  . Breast implant capsular contracture 08/21/2020  . Acquired hypothyroidism 11/21/2017  . Weight gain 11/21/2017  . Gastroesophageal reflux disease 11/21/2017  . Left foot pain 09/19/2016  . Breast cancer in female [facility]) 07/23/2015  . Breast cancer, right breast ([place]) 06/04/2015  . Fever 09/14/2014  . Leukopenia 09/14/2014  . Flu-like symptoms 09/14/2014  . Acute bronchitis 09/14/2014  . Breast cancer of lower-outer quadrant of right female breast ([place]) 04/17/2014  . DEPRESSION 08/03/2007  . SWELLING MASS OR LUMP IN HEAD AND NECK 08/03/2007    Allyson Sabal [facility] 09/29/2020, 8:56 AM  [place], [place], [address] Phone: [phone]   Fax:  [phone]  Name: Olivia Acosta MRN: [id number] Date of Birth: [birth year]  Manus Gunning, PT 09/29/20 8:57 AM

## 2020-09-29 NOTE — Progress Notes (Addendum)
   Subjective:    Patient ID: Olivia Acosta, female    DOB: 27-Jul-1960, 60 y.o.   MRN: [id number]  The patient is a 60 year old female joining me by a virtual visit.  The patient was diagnosed with right breast cancer in 2015 when she felt a lesion.  This was 2 months after she had a normal routine screening mammogram.  She had an MRI and biopsies which showed multifocal invasive lobular carcinoma with 6 out of 12 lymph nodes positive.  She had bilateral mastectomies in November 2015 with expanders placed.  She ended up needing radiation on the right breast and was converted to a right latissimus muscle flap.  She has [employer] silicone implants 948 cc in place.  She is complaining of pain and tenderness in the right upper breast axilla.  She has started physical therapy.  She had ultrasound 6 days ago which was negative.  She is still concerned about it and would like further evaluation.  This would be MRI.  She is concerned that the breast look a little bit different but the main concern is her pain.     Review of Systems  Constitutional: Negative.   HENT: Negative.   Eyes: Negative.   Respiratory: Negative.   Cardiovascular: Negative.   Gastrointestinal: Negative.   Endocrine: Negative.   Genitourinary: Negative.   Musculoskeletal: Negative.   Neurological: Negative.   Hematological: Negative.        Objective:   Physical Exam    Assessment & Plan:     ICD-10-CM   1. Capsular contracture of breast implant, initial encounter  T85.44XA   2. Malignant neoplasm of lower-outer quadrant of right breast of female, estrogen receptor positive ([name])  C50.511    Z17.0   Plan for MRI.  Order placed.  She will plan to give me a call after she has the MRI and we will talk further.  Patient expressed being comfortable with this plan.  I connected with  Lounell Schumacher on 09/29/20 by a video enabled telemedicine application and verified that I am speaking with the correct person  using two identifiers.  The patient was at home and I was at the office. We spent 10 minutes discussing the options and plan.    I discussed the limitations of evaluation and management by telemedicine. The patient expressed understanding and agreed to proceed.

## 2020-09-30 ENCOUNTER — Telehealth: Payer: Self-pay | Admitting: Plastic Surgery

## 2020-09-30 DIAGNOSIS — Z170 Estrogen receptor positive status [ER+]: Secondary | ICD-10-CM

## 2020-09-30 DIAGNOSIS — C50511 Malignant neoplasm of lower-outer quadrant of right female breast: Secondary | ICD-10-CM

## 2020-09-30 DIAGNOSIS — N644 Mastodynia: Secondary | ICD-10-CM

## 2020-09-30 DIAGNOSIS — T8544XA Capsular contracture of breast implant, initial encounter: Secondary | ICD-10-CM

## 2020-09-30 NOTE — Telephone Encounter (Signed)
-----   Message from Olivia Acosta sent at 09/30/2020  2:15 PM EDT ----- Can you update this MRI order to reflect MRI Bilateral Breast with and without contrast?

## 2020-10-01 ENCOUNTER — Encounter: Payer: Commercial Managed Care - PPO | Admitting: Rehabilitation

## 2020-10-01 ENCOUNTER — Ambulatory Visit (INDEPENDENT_AMBULATORY_CARE_PROVIDER_SITE_OTHER): Payer: Commercial Managed Care - PPO | Admitting: Psychology

## 2020-10-01 DIAGNOSIS — F4323 Adjustment disorder with mixed anxiety and depressed mood: Secondary | ICD-10-CM

## 2020-10-02 ENCOUNTER — Telehealth: Payer: Self-pay

## 2020-10-02 NOTE — Telephone Encounter (Signed)
Call to pt- no answer/left voicemail of the following:   per Dr. Marla Roe- regarding her ultrasound results- which were negative & Dr. Marla Roe informed her to let us know if she would like to try massage or PT. I instructed her to call the office for further plan of care.

## 2020-10-06 ENCOUNTER — Encounter: Payer: Self-pay | Admitting: Physical Therapy

## 2020-10-06 ENCOUNTER — Other Ambulatory Visit: Payer: Self-pay

## 2020-10-06 ENCOUNTER — Ambulatory Visit: Payer: Commercial Managed Care - PPO | Attending: Plastic Surgery | Admitting: Physical Therapy

## 2020-10-06 DIAGNOSIS — Z483 Aftercare following surgery for neoplasm: Secondary | ICD-10-CM | POA: Insufficient documentation

## 2020-10-06 DIAGNOSIS — L599 Disorder of the skin and subcutaneous tissue related to radiation, unspecified: Secondary | ICD-10-CM | POA: Insufficient documentation

## 2020-10-06 NOTE — Therapy (Signed)
[place], [place], [address] Phone: [phone]   Fax:  [phone]  Physical Therapy Treatment  Patient Details  Name: Olivia Acosta MRN: [id number] Date of Birth: Mar 01, 1961 Referring Provider (PT): [place]   Encounter Date: 10/06/2020   PT End of Session - 10/06/20 0856    Visit Number 7    Number of Visits 15    Date for PT Re-Evaluation 11/03/20    PT Start Time 0808    PT Stop Time 0856    PT Time Calculation (min) 48 min    Activity Tolerance Patient tolerated treatment well    Behavior During Therapy [facility] for tasks assessed/performed           Past Medical History:  Diagnosis Date  . Arthritis    fingers stiff  . Breast cancer ([place])    R breast- 2015  . Bronchitis 2016   treated /w IV antibiotics, admitted for 2 nights [facility]  . GERD (gastroesophageal reflux disease)   . Neuropathy    both feet since chemotherapy  . Thyroid disease   . UTI (urinary tract infection)     Past Surgical History:  Procedure Laterality Date  . BREAST RECONSTRUCTION Bilateral 04/17/2014  . BREAST RECONSTRUCTION Right 07/23/2015   Procedure: RIGHT CHEST WOUND DEBRIDEMENT, RIGHT BREAST RECONSTRUCTION WITH SILICONE GEL IMPLANT;  Surgeon: Crissie Reese, MD;  Location: [place];  Service: Plastics;  Laterality: Right;  . BREAST RECONSTRUCTION WITH PLACEMENT OF TISSUE EXPANDER AND FLEX HD (ACELLULAR HYDRATED DERMIS) Bilateral 04/17/2014   Procedure: BILATERAL BREAST RECONSTRUCTION WITH PLACEMENT OF TISSUE EXPANDER AND FLEX HD (ACELLULAR HYDRATED DERMIS);  Surgeon: Crissie Reese, MD;  Location: [place];  Service: Plastics;  Laterality: Bilateral;  . COMPLETE MASTECTOMY W/ SENTINEL NODE BIOPSY Bilateral 04/17/2014   NIPPLE SPARING RECONSTRUCTION  . LATISSIMUS FLAP TO BREAST Right 07/23/2015   Procedure: LATISSIMUS FLAP TO RIGHT CHEST;  Surgeon: Crissie Reese, MD;  Location: [place];  Service: Plastics;   Laterality: Right;  . MASTECTOMY W/ SENTINEL NODE BIOPSY Bilateral 04/17/2014   Procedure: BILATERAL NIPPLE SPARING MASTECTOMY WITH SENTINEL LYMPH NODE BIOPSY;  Surgeon: Excell Seltzer, MD;  Location: [place];  Service: General;  Laterality: Bilateral;  . PORT A CATH REVISION Right 05/27/2014   Procedure: PORT A CATH REVISION;  Surgeon: Excell Seltzer, MD;  Location: [place];  Service: General;  Laterality: Right;  . PORT-A-CATH REMOVAL Right 06/04/2015   Procedure: REMOVAL PORT-A-CATH;  Surgeon: Crissie Reese, MD;  Location: [place];  Service: Plastics;  Laterality: Right;  . PORTACATH PLACEMENT Left 05/27/2014   Procedure: INSERTION PORT-A-CATH;  Surgeon: Excell Seltzer, MD;  Location: [place];  Service: General;  Laterality: Left;  . REMOVAL OF TISSUE EXPANDER AND PLACEMENT OF IMPLANT Bilateral 06/04/2015   Procedure: REMOVAL OF BILATERAL TISSUE EXPANDER AND DELAYED BREAST RECONSTRUCTION PLACEMENT OF IMPLANTS;  Surgeon: Crissie Reese, MD;  Location: [place];  Service: Plastics;  Laterality: Bilateral;  . SALIVARY STONE REMOVAL  2009  . stricture of the uretha  1975  . TONSILLECTOMY    . TONSILLECTOMY AND ADENOIDECTOMY  1974  . URETHRAL STRICTURE DILATATION      There were no vitals filed for this visit.   Subjective Assessment - 10/06/20 0810    Subjective I saw the doctor and it was a video appoitment. The doctor wants me to do an MRI. It is scheduled for 2 weeks from now.    Pertinent History double mastectomy 04/2014, chemo  started Jan 2016, radiation June 2016, reconstructive surgery 05/2015, the tissue gave way and did not heal on R side so pt had lat dorsi flap procedure in 07/2015, diagnosed with Hashimotos disease 2019    Patient Stated Goals decrease back spasms and R breast pain    Currently in Pain? No/denies    Pain Score 0-No pain                             [facility] Adult PT Treatment/Exercise - 10/06/20 0001       Manual Therapy   Soft tissue mobilization in supine to the axilla with arm overhead position and in sidelying using cocoa butter to latissimus body and scar tissue mobilization in various cross scar directions.    Passive ROM to the right shoulder into flexion and abduction                       PT Long Term Goals - 10/06/20 4166      PT LONG TERM GOAL #1   Title Pt will report a 75% improvement in back pain on R side when reaching for objects to allow improved comfort.    Baseline 10/06/20- 75% improvement    Time 4    Period Weeks    Status Achieved      PT LONG TERM GOAL #2   Title Pt will report a 75% improvement in R breast/chest pain throughout the day to allow improved comfort.    Baseline 10/06/20- 50% improvement    Time 4    Period Weeks    Status On-going      PT LONG TERM GOAL #3   Title Pt will be independent in a home exercise program for continued strengthening and stretching.    Time 4    Period Weeks    Status On-going                 Plan - 10/06/20 0856    Clinical Impression Statement Assessed pt's progress towards goals in therapy. Pt has met her goal for decreasing back pain and spasms. Pt continues to have R breast pain though it is improving. Sent an inbasket message to Dr. Marla Roe to see if it is ok to do breast massage to help decrease pain. Continued with manual therapy to decrease back pain and spasms and improve scar mobility. Pt would benefit from continued skilled PT services to instruct pt in Strength After Breast Cancer Program and to work on breast massage if cleared by pt's surgeon.    PT Frequency 2x / week    PT Duration 4 weeks    PT Treatment/Interventions ADLs/Self Care Home Management;DME Instruction;Therapeutic exercise;Patient/family education;Manual techniques;Manual lymph drainage;Passive range of motion;Orthotic Fit/Training;Compression bandaging;Scar mobilization;Taping    PT Next Visit Plan see if dr replied about  breast massage, instruct in Strength ABC,  cont STM to R lat and pec, stretching for pec and lats,    PT Home Exercise Plan Access Code: [email or web address]    Consulted and Agree with Plan of Care Patient           Patient will benefit from skilled therapeutic intervention in order to improve the following deficits and impairments:     Visit Diagnosis: Aftercare following surgery for neoplasm  Disorder of the skin and subcutaneous tissue related to radiation, unspecified     Problem List Patient Active Problem List   Diagnosis Date Noted  .  Breast pain 08/21/2020  . Breast implant capsular contracture 08/21/2020  . Acquired hypothyroidism 11/21/2017  . Weight gain 11/21/2017  . Gastroesophageal reflux disease 11/21/2017  . Left foot pain 09/19/2016  . Breast cancer in female [facility]) 07/23/2015  . Breast cancer, right breast ([place]) 06/04/2015  . Fever 09/14/2014  . Leukopenia 09/14/2014  . Flu-like symptoms 09/14/2014  . Acute bronchitis 09/14/2014  . Breast cancer of lower-outer quadrant of right female breast ([place]) 04/17/2014  . DEPRESSION 08/03/2007  . SWELLING MASS OR LUMP IN HEAD AND NECK 08/03/2007    Allyson Sabal [facility] 10/06/2020, 9:00 [address] [place], [place], [address] Phone: [phone]   Fax:  [phone]  Name: Rotunda Worden MRN: [id number] Date of Birth: Mar 02, 1961  Manus Gunning, PT 10/06/20 9:00 AM

## 2020-10-08 ENCOUNTER — Ambulatory Visit: Payer: Commercial Managed Care - PPO | Admitting: Physical Therapy

## 2020-10-22 ENCOUNTER — Other Ambulatory Visit: Payer: Commercial Managed Care - PPO

## 2020-11-05 ENCOUNTER — Other Ambulatory Visit: Payer: Self-pay

## 2020-11-05 ENCOUNTER — Ambulatory Visit
Admission: RE | Admit: 2020-11-05 | Discharge: 2020-11-05 | Disposition: A | Discharge status: Home / Self Care | Payer: Commercial Managed Care - PPO | Source: Ambulatory Visit | Attending: Plastic Surgery | Admitting: Plastic Surgery

## 2020-11-05 DIAGNOSIS — T8544XA Capsular contracture of breast implant, initial encounter: Secondary | ICD-10-CM

## 2020-11-05 DIAGNOSIS — C50511 Malignant neoplasm of lower-outer quadrant of right female breast: Secondary | ICD-10-CM

## 2020-11-05 DIAGNOSIS — N644 Mastodynia: Secondary | ICD-10-CM

## 2020-11-05 MED ORDER — GADOBUTROL 1 MMOL/ML IV SOLN
7.0000 mL | Freq: Once | INTRAVENOUS | Status: AC | PRN
  Administered 2020-11-05: 7 mL via INTRAVENOUS

## 2020-11-08 NOTE — Assessment & Plan Note (Signed)
Right breast invasive lobular cancer 5.5 cm multifocal disease with 6/12 lymph nodes positive T3, N2, M0 stage IIIa, ER 92%, PR 98%, HER-2 negative ratio 1.13, Ki-67 10% status post bilateral mastectomies on 04/17/2014, status post adjuvant chemotherapy started 06/12/2014 and completed 10/23/2014, started radiation June 2016 completed August 2016. Started anastrozole 02/02/2015 7years  Anastrozole toxicities: Denies any hot flashes or myalgias. Tolerating it extremely well. She does have some muscle stiffness and achiness in her hands but it gets better with activity.  Breast reconstruction with latissimus dorsi flap done in December 2016 and February 2017.  Bone density 03/24/2015 (Dr.Grewall) T score -0.8 normal Taking Zinc, B 12 and Vit D, probiotic  Weight Gain: Seeing [facility] (sees Raoul Pitch)  Breast cancer surveillance:  1. Physical examination does not reveal any abnormalities. 2. Mammogram 11/05/20: No MRI evidence of malignancy, non specific ST edema in left axilla ? Mass/abnormal LN, Extra capsular silicone (recommend U/S axilla)  Return to clinic in 1 year for follow-up

## 2020-11-08 NOTE — Progress Notes (Signed)
Patient Care Team: Deeann Saint, MD as PCP - General (Family Medicine)  DIAGNOSIS:    ICD-10-CM   1. Malignant neoplasm of lower-outer quadrant of right breast of female, estrogen receptor positive (HCC)  C50.511    Z17.0     SUMMARY OF ONCOLOGIC HISTORY: Oncology History  Breast cancer of lower-outer quadrant of right female breast (HCC)  01/31/2014 Breast MRI   Right breast mass 3.3 x 2.8 x 3.4 cm with several foci of subcentimeter enhancing nodules extending anteriorly to worsen nipple up to 5.4 cm anterior to the mass    04/17/2014 Surgery   Bilateral mastectomies, left: Benign, right breast multifocal ILC grade 2 largest 5.5 cm, posterior margin focally positive, 2 SLN's positive with extracapsular extension + 4/10 axillary LN positive, ER 92%, PR 98%, HER-2 -1.1.3, Ki 67 10%   06/12/2014 - 10/23/2014 Chemotherapy   Adriamycin and Cytoxan dose dense every 2 weeks x4 followed by Taxol x12   09/14/2014 - 09/16/2014 Hospital Admission   Neutropenic fever   12/09/2014 - 01/20/2015 Radiation Therapy   Adjuvant radiation therapy   02/02/2015 -  Anti-estrogen oral therapy   Anastrozole 1 mg daily X 5 years     CHIEF COMPLIANT: Follow-up of right breast cancer on anastrozole therapy  INTERVAL HISTORY: Olivia Acosta is a 60 y.o. with above-mentioned history of right breast cancer treated with bilateral mastectomies, adjuvant chemotherapy, radiation, and who is currently on anastrozole.She palpated a right breast mass and [place] on 06/25/20 showed a 0.9cm mass. Biopsy on 06/26/20 showed dense fibrosis consistent with scar and no evidence of malignancy. Breast MRI on 11/05/20 showed no evidence of malignancy bilaterally. She presents to the clinic today for annual follow-up. She reports itching in the lower outer aspect of the left reconstructed breast.  In the right breast she has had chronic discomfort and had ultrasound evaluation in April which did not identify any abnormality.   Previously she had 2 biopsies showing fat necrosis.  ALLERGIES:  is allergic to ciprofloxacin and penicillins.  MEDICATIONS:  Current Outpatient Medications  Medication Sig Dispense Refill  . acetaminophen (TYLENOL) 500 MG tablet Take by mouth.    Marland Kitchen anastrozole (ARIMIDEX) 1 MG tablet TAKE 1 TABLET(1 MG) BY MOUTH DAILY 90 tablet 3  . Cholecalciferol (VITAMIN D) 10 MCG/ML LIQD Vitamin D    . esomeprazole (NEXIUM) 20 MG capsule TAKE 1 CAPSULE BY MOUTH EVERY DAY AT 12 NOON 30 capsule 2  . levothyroxine (SYNTHROID, LEVOTHROID) 50 MCG tablet TK 1 T PO QD  6  . Magnesium 100 MG CAPS Take 1,350 mg by mouth.    . Methylcobalamin (METHYL B-12) 1000 MCG LOZG Take by mouth.    . Probiotic Product (EQ PROBIOTIC) CAPS Take by mouth. Mega spore boitic    . valACYclovir (VALTREX) 500 MG tablet Take 500 mg by mouth daily before breakfast.     . Zinc 30 MG TABS Take by mouth.     No current facility-administered medications for this visit.    PHYSICAL EXAMINATION: ECOG PERFORMANCE STATUS: 1 - Symptomatic but completely ambulatory  There were no vitals filed for this visit. There were no vitals filed for this visit.  BREAST: Bilateral reconstructed breast without any palpable abnormalities of concern.. (exam performed in the presence of a chaperone)  LABORATORY DATA:  I have reviewed the data as listed CMP Latest Ref Rng & Units 09/14/2020 07/23/2015 06/05/2015  Glucose 70 - 99 mg/dL 86 89 927(G)  BUN 6 - 23 mg/dL 18  13 5(L)  Creatinine 0.40 - 1.20 mg/dL 0.81 0.93 0.74  Sodium 135 - 145 mEq/L 137 139 142  Potassium 3.5 - 5.1 mEq/L 4.3 4.3 3.9  Chloride 96 - 112 mEq/L 100 102 106  CO2 19 - 32 mEq/L $Remove'28 24 26  'zUSlsmw$ Calcium 8.4 - 10.5 mg/dL 9.8 9.5 8.9  Total Protein 6.5 - 8.1 g/dL - - -  Total Bilirubin 0.3 - 1.2 mg/dL - - -  Alkaline Phos 38 - 126 U/L - - -  AST 15 - 41 U/L - - -  ALT 14 - 54 U/L - - -    Lab Results  Component Value Date   WBC 5.3 09/14/2020   HGB 14.1 09/14/2020   HCT 42.0  09/14/2020   MCV 91.7 09/14/2020   PLT 206.0 09/14/2020   NEUTROABS 3.4 09/14/2020    ASSESSMENT & PLAN:  Breast cancer of lower-outer quadrant of right female breast Right breast invasive lobular cancer 5.5 cm multifocal disease with 6/12 lymph nodes positive T3, N2, M0 stage IIIa, ER 92%, PR 98%, HER-2 negative ratio 1.13, Ki-67 10% status post bilateral mastectomies on 04/17/2014, status post adjuvant chemotherapy started 06/12/2014 and completed 10/23/2014, started radiation June 2016 completed August 2016. Started anastrozole 02/02/2015 7years  Anastrozole toxicities: Denies any hot flashes or myalgias. Tolerating it extremely well. She does have some muscle stiffness and achiness in her hands but it gets better with activity.  Breast reconstruction with latissimus dorsi flap done in December 2016 and February 2017.  Bone density 03/24/2015 (Dr.Grewall) T score -0.8 normal (we will request bone density test copy from her office) Taking Zinc, B 12 and Vit D, probiotic  Weight Gain: Seeing [facility] (sees Raoul Pitch)  Breast cancer surveillance:  1. Physical examination does not reveal any abnormalities. 2. Mammogram 11/05/20: No MRI evidence of malignancy, non specific ST edema in left axilla ? Mass/abnormal LN, Extra capsular silicone (recommend U/S axilla)  Breast MRI: No MRI evidence of malignancy.  Nonspecific soft tissue edema in the left axilla, needs ultrasound.  Small focus of extracapsular silicone inferior lateral margin of the left breast implant  I sent a message to Dr. Elisabeth Cara regarding the breast MRI.  We will request for the ultrasound to be done. Patient is contemplating on removing the implants and will discuss with Dr. Elisabeth Cara about the leakage.  Return to clinic in 1 year for follow-up.    No orders of the defined types were placed in this encounter.  The patient has a good understanding of the overall plan. she agrees with  it. she will call with any problems that may develop before the next visit here.  Total time spent: 20 mins including face to face time and time spent for planning, charting and coordination of care  Rulon Eisenmenger, MD, MPH 11/09/2020  I, Cloyde Reams Dorshimer, am acting as scribe for Dr. Nicholas Lose.  I have reviewed the above documentation for accuracy and completeness, and I agree with the above.

## 2020-11-09 ENCOUNTER — Inpatient Hospital Stay: Payer: Commercial Managed Care - PPO | Attending: Hematology and Oncology | Admitting: Hematology and Oncology

## 2020-11-09 ENCOUNTER — Other Ambulatory Visit: Payer: Self-pay | Admitting: *Deleted

## 2020-11-09 ENCOUNTER — Other Ambulatory Visit: Payer: Self-pay | Admitting: Hematology and Oncology

## 2020-11-09 ENCOUNTER — Other Ambulatory Visit: Payer: Self-pay

## 2020-11-09 DIAGNOSIS — Z923 Personal history of irradiation: Secondary | ICD-10-CM | POA: Insufficient documentation

## 2020-11-09 DIAGNOSIS — C50511 Malignant neoplasm of lower-outer quadrant of right female breast: Secondary | ICD-10-CM

## 2020-11-09 DIAGNOSIS — R928 Other abnormal and inconclusive findings on diagnostic imaging of breast: Secondary | ICD-10-CM

## 2020-11-09 DIAGNOSIS — Z79811 Long term (current) use of aromatase inhibitors: Secondary | ICD-10-CM | POA: Diagnosis not present

## 2020-11-09 DIAGNOSIS — Z9221 Personal history of antineoplastic chemotherapy: Secondary | ICD-10-CM | POA: Diagnosis not present

## 2020-11-09 DIAGNOSIS — Z9013 Acquired absence of bilateral breasts and nipples: Secondary | ICD-10-CM | POA: Diagnosis not present

## 2020-11-09 DIAGNOSIS — Z853 Personal history of malignant neoplasm of breast: Secondary | ICD-10-CM | POA: Diagnosis present

## 2020-11-09 DIAGNOSIS — Z170 Estrogen receptor positive status [ER+]: Secondary | ICD-10-CM | POA: Diagnosis not present

## 2020-11-10 ENCOUNTER — Telehealth: Payer: Self-pay | Admitting: Hematology and Oncology

## 2020-11-10 NOTE — Telephone Encounter (Signed)
Scheduled per 6/6 los. Called and spoke with pt confirmed 6/6 appt

## 2020-11-11 ENCOUNTER — Ambulatory Visit: Payer: Self-pay | Admitting: Hematology and Oncology

## 2020-11-12 ENCOUNTER — Ambulatory Visit: Payer: Commercial Managed Care - PPO | Admitting: Hematology and Oncology

## 2020-11-17 ENCOUNTER — Other Ambulatory Visit: Payer: Self-pay

## 2020-11-17 ENCOUNTER — Other Ambulatory Visit: Payer: Self-pay | Admitting: Hematology and Oncology

## 2020-11-17 ENCOUNTER — Telehealth (INDEPENDENT_AMBULATORY_CARE_PROVIDER_SITE_OTHER): Payer: Commercial Managed Care - PPO | Admitting: Plastic Surgery

## 2020-11-17 ENCOUNTER — Encounter: Payer: Self-pay | Admitting: Plastic Surgery

## 2020-11-17 DIAGNOSIS — C50511 Malignant neoplasm of lower-outer quadrant of right female breast: Secondary | ICD-10-CM | POA: Diagnosis not present

## 2020-11-17 DIAGNOSIS — Z9013 Acquired absence of bilateral breasts and nipples: Secondary | ICD-10-CM | POA: Diagnosis not present

## 2020-11-17 DIAGNOSIS — Z9010 Acquired absence of unspecified breast and nipple: Secondary | ICD-10-CM | POA: Insufficient documentation

## 2020-11-17 DIAGNOSIS — Z170 Estrogen receptor positive status [ER+]: Secondary | ICD-10-CM

## 2020-11-17 NOTE — Progress Notes (Signed)
   Subjective:    Patient ID: Olivia Acosta, female    DOB: [birth year], 60 y.o.   MRN: [id number]  The patient is a 60 yrs old female joining me for televisit.  The patient had breast cancer in 2015.  She underwent bilateral mastectomies for the right-sided breast cancer.  She originally had expanders and due to the radiation ended up with a latissimus muscle flap on the right side.  The last record showed Mentor 600 cc implants bilaterally.  The patient reports that these were changed out at some point in time.  Her current concern has been tenderness in the right axilla.  We went ahead and did an ultrasound and followed up with an MRI.  She is also done physical therapy.  She has seen some improvement with the PT on the right.  The MRI was suspicious for leaking of the left implant.  This was of concern to her.  The radiologist recommended a repeat ultrasound.  The patient is otherwise doing well.  She is 5 feet 6 inches tall weighs 184 pounds.   Review of Systems  Constitutional: Negative.   HENT: Negative.    Eyes: Negative.   Respiratory: Negative.    Cardiovascular: Negative.   Gastrointestinal: Negative.   Endocrine: Negative.   Genitourinary: Negative.   Musculoskeletal: Negative.   Neurological: Negative.   Hematological: Negative.   Psychiatric/Behavioral: Negative.        Objective:   Physical Exam      Assessment & Plan:   I connected with  Olivia Acosta on 11/17/20 by a video enabled telemedicine application and verified that I am speaking with the correct person using two identifiers.   I discussed the limitations of evaluation and management by telemedicine. The patient expressed understanding and agreed to proceed.  The patient is at home and I am in the office.    Total time: 30 minutes. This includes time spent with the patient during the visit as well as time spent before and after the visit reviewing the chart and documenting the encounter.   There patient has scheduled the repeat ultrasound.  She is going to do that in August.  We will meet after that to discuss further plans.  Most likely she will need removal and replacement of her implants with capsular contracture release on the right breast.

## 2020-12-01 ENCOUNTER — Other Ambulatory Visit: Payer: Commercial Managed Care - PPO

## 2020-12-31 ENCOUNTER — Ambulatory Visit (INDEPENDENT_AMBULATORY_CARE_PROVIDER_SITE_OTHER): Payer: Commercial Managed Care - PPO | Admitting: Psychology

## 2020-12-31 DIAGNOSIS — F4323 Adjustment disorder with mixed anxiety and depressed mood: Secondary | ICD-10-CM

## 2021-02-15 ENCOUNTER — Other Ambulatory Visit: Payer: Self-pay | Admitting: Hematology and Oncology

## 2021-02-15 DIAGNOSIS — C50511 Malignant neoplasm of lower-outer quadrant of right female breast: Secondary | ICD-10-CM

## 2021-03-04 ENCOUNTER — Ambulatory Visit (INDEPENDENT_AMBULATORY_CARE_PROVIDER_SITE_OTHER): Payer: Commercial Managed Care - PPO | Admitting: Psychology

## 2021-03-04 DIAGNOSIS — F4323 Adjustment disorder with mixed anxiety and depressed mood: Secondary | ICD-10-CM | POA: Diagnosis not present

## 2021-05-06 ENCOUNTER — Other Ambulatory Visit: Payer: Self-pay | Admitting: Hematology and Oncology

## 2021-05-06 ENCOUNTER — Ambulatory Visit (INDEPENDENT_AMBULATORY_CARE_PROVIDER_SITE_OTHER): Payer: Commercial Managed Care - PPO | Admitting: Psychology

## 2021-05-06 DIAGNOSIS — C50511 Malignant neoplasm of lower-outer quadrant of right female breast: Secondary | ICD-10-CM

## 2021-05-06 DIAGNOSIS — F4323 Adjustment disorder with mixed anxiety and depressed mood: Secondary | ICD-10-CM | POA: Diagnosis not present

## 2021-05-25 ENCOUNTER — Other Ambulatory Visit: Payer: Self-pay | Admitting: *Deleted

## 2021-05-25 ENCOUNTER — Other Ambulatory Visit: Payer: Self-pay | Admitting: Student

## 2021-05-25 ENCOUNTER — Other Ambulatory Visit: Payer: Self-pay | Admitting: Registered Nurse

## 2021-05-25 DIAGNOSIS — E039 Hypothyroidism, unspecified: Secondary | ICD-10-CM

## 2021-05-25 DIAGNOSIS — R0989 Other specified symptoms and signs involving the circulatory and respiratory systems: Secondary | ICD-10-CM

## 2021-05-25 DIAGNOSIS — E063 Autoimmune thyroiditis: Secondary | ICD-10-CM

## 2021-06-24 ENCOUNTER — Other Ambulatory Visit: Payer: Commercial Managed Care - PPO

## 2021-07-29 ENCOUNTER — Ambulatory Visit (INDEPENDENT_AMBULATORY_CARE_PROVIDER_SITE_OTHER): Payer: Commercial Managed Care - PPO | Admitting: Psychology

## 2021-07-29 DIAGNOSIS — F4323 Adjustment disorder with mixed anxiety and depressed mood: Secondary | ICD-10-CM

## 2021-07-29 NOTE — Progress Notes (Signed)
[facility] Counselor/Therapist Progress Note  Patient ID: Olivia Acosta, MRN: [id number],    Date: 07/29/2021  Time Spent: 50 mins  Treatment Type: Individual Therapy  Reported Symptoms: Pt presented for follow up session, via webex video due to virus outbreak.  Pt granted consent for session, stating she is in her home with no one else present.  I shared with pt that I am in my home office with no one else here.   Mental Status Exam: Appearance:  Casual     Behavior: Appropriate  Motor: Normal  Speech/Language:  Clear and Coherent  Affect: Appropriate  Mood: normal  Thought process: normal  Thought content:   WNL  Sensory/Perceptual disturbances:   WNL  Orientation: oriented to person, place, and time/date  Attention: Good  Concentration: Good  Memory: WNL  Fund of knowledge:  Good  Insight:   Good  Judgment:  Good  Impulse Control: Good   Risk Assessment: Danger to Self:  No Self-injurious Behavior: No Danger to Others: No Duty to Warn:no Physical Aggression / Violence:No  Access to Firearms a concern: No  Gang Involvement:No   Subjective: Pt shares that on 05/21/2021 she was granted guardianship for her mom and her mom has been living with pt since October 2023; pt's brother is still living on pt's mom's property in a spare building.  Pt is interested in having her brother evicted from the property.  Pt has been able to balance work and caring for her mom to this point.  Pt also has been researching care programs for her mom.  Her mom is currently in a part time eldercare program provided by [facility] for 4 hrs per day during the week.  Pt is also looking into more significant levels of care for her mom, for when she needs it.  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  Pt shares that she is taking her mom to [place] to see family in April again.  Olivia Acosta is doing well; she is still in school and is working and has a boyfriend now.  Olivia Acosta is seeing a new psychiatrist since her other one retired; this one is a woman and that is helpful for her.  Pt shares that Olivia Acosta has been arrested again for Failure to Appear and possessing burglary tool; he has been arrested 20 times and will be 61 yrs old soon.  Pt continues to go to the gym 2-3 days per week, spending time with friends, will be volunteering with the [employer], researching small business opportunities, etc.  Encouraged pt to continue with her self care activities and we will meet in 3 months for a follow up session.  Interventions: Cognitive Behavioral Therapy  Diagnosis:Adjustment disorder with mixed anxiety and depressed mood  Plan: Treatment Plan Strengths/Abilities:  Intelligent, Intuitive, Willing to participate in therapy Treatment Preferences:  Outpatient Individual Therapy Statement of Needs:  Patient is to use CBT, mindfulness and coping skills to help manage and/or decrease symptoms associated with their diagnosis. Symptoms:  Depressed/Irritable mood, worry, social withdrawal Problems Addressed:  Depressive thoughts, Sadness, Sleep issues, etc. Long Term Goals:  Pt to reduce overall level, frequency, and intensity of the feelings of depression/anxiety as evidenced by decreased irritability, negative self talk, and helpless feelings from 6  to 7 days/week to 0 to 1 days/week, per client report, for at least 3 consecutive months.  Progress:  Short Term Goals:  Pt to verbally express understanding of the relationship between feelings of depression/anxiety and their impact on thinking patterns and behaviors.  Pt to verbalize an understanding of the role that distorted thinking plays in creating fears, excessive worry, and ruminations.  Progress:  Target Date:  07/29/2022 Frequency:   Bi-weekly Modality:  Cognitive Behavioral Therapy Interventions by Therapist:  Therapist will use CBT, Mindfulness exercises, Coping skills and Referrals, as needed by client. Client has verbally approved this treatment plan.  [name], [facility]

## 2021-10-14 ENCOUNTER — Telehealth: Payer: Self-pay | Admitting: Hematology and Oncology

## 2021-10-14 NOTE — Telephone Encounter (Signed)
Rescheduled appointment per provider PAL. Patient is aware of the changes made to her upcoming appointment. 

## 2021-10-15 ENCOUNTER — Other Ambulatory Visit: Payer: Self-pay | Admitting: Hematology and Oncology

## 2021-10-15 DIAGNOSIS — C50511 Malignant neoplasm of lower-outer quadrant of right female breast: Secondary | ICD-10-CM

## 2021-10-28 ENCOUNTER — Ambulatory Visit (INDEPENDENT_AMBULATORY_CARE_PROVIDER_SITE_OTHER): Payer: Commercial Managed Care - PPO | Admitting: Psychology

## 2021-10-28 DIAGNOSIS — F4323 Adjustment disorder with mixed anxiety and depressed mood: Secondary | ICD-10-CM | POA: Diagnosis not present
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
## 2021-11-09 ENCOUNTER — Ambulatory Visit: Payer: Commercial Managed Care - PPO | Admitting: Hematology and Oncology

## 2021-11-11 NOTE — Progress Notes (Signed)
Patient Care Team: Olivia Ruddy, MD as PCP - General (Family Medicine)  DIAGNOSIS:  Encounter Diagnosis  Name Primary?   Malignant neoplasm of lower-outer quadrant of right breast of female, estrogen receptor positive ([place])     SUMMARY OF ONCOLOGIC HISTORY: Oncology History  Breast cancer of lower-outer quadrant of right female breast ([place])  01/31/2014 Breast MRI   Right breast mass 3.3 x 2.8 x 3.4 cm with several foci of subcentimeter enhancing nodules extending anteriorly to worsen nipple up to 5.4 cm anterior to the mass    04/17/2014 Surgery   Bilateral mastectomies, left: Benign, right breast multifocal ILC grade 2 largest 5.5 cm, posterior margin focally positive, 2 SLN's positive with extracapsular extension + 4/10 axillary LN positive, ER 92%, PR 98%, HER-2 -1.1.3, Ki 67 10%   06/12/2014 - 10/23/2014 Chemotherapy   Adriamycin and Cytoxan dose dense every 2 weeks x4 followed by Taxol x12   09/14/2014 - 09/16/2014 Hospital Admission   Neutropenic fever   12/09/2014 - 01/20/2015 Radiation Therapy   Adjuvant radiation therapy   02/02/2015 -  Anti-estrogen oral therapy   Anastrozole 1 mg daily X 5 years     CHIEF COMPLIANT: Follow-up of right breast cancer on anastrozole therapy  INTERVAL HISTORY: Olivia Acosta is a   61 y.o. with above-mentioned history of right breast. She presents to the clinic today for a follow-up. States that she is tolerating the anastrozole well. States that she has been working out. Denies any side effects and symptoms.  ALLERGIES:  is allergic to ciprofloxacin and penicillins.  MEDICATIONS:  Current Outpatient Medications  Medication Sig Dispense Refill   acetaminophen (TYLENOL) 500 MG tablet Take by mouth.     Cholecalciferol (VITAMIN D) 10 MCG/ML LIQD Vitamin D     esomeprazole (NEXIUM) 20 MG capsule TAKE 1 CAPSULE BY MOUTH EVERY DAY AT 12 NOON 30 capsule 2   levothyroxine (SYNTHROID, LEVOTHROID) 50 MCG tablet TK 1 T PO QD  6    Magnesium 100 MG CAPS Take 1,350 mg by mouth.     Methylcobalamin (METHYL B-12) 1000 MCG LOZG Take by mouth.     Probiotic Product (EQ PROBIOTIC) CAPS Take by mouth. Mega spore boitic     valACYclovir (VALTREX) 500 MG tablet Take 500 mg by mouth daily before breakfast.      Zinc 30 MG TABS Take by mouth.     No current facility-administered medications for this visit.    PHYSICAL EXAMINATION: ECOG PERFORMANCE STATUS: 1 - Symptomatic but completely ambulatory  Vitals:   11/25/21 0811  BP: 130/79  Pulse: 69  Resp: 18  Temp: 97.8 F (36.6 C)  SpO2: 100%   Filed Weights   11/25/21 0811  Weight: 169 lb 4.8 oz (76.8 kg)    BREAST: Bilateral breast reconstructions.  No palpable lumps or nodules of concern (exam performed in the presence of a chaperone)  LABORATORY DATA:  I have reviewed the data as listed    Latest Ref Rng & Units 09/14/2020   10:12 AM 07/23/2015   12:28 PM 06/05/2015    5:30 AM  CMP  Glucose 70 - 99 mg/dL 86  89  105   BUN 6 - 23 mg/dL $Remove'18  13  5   'LLAqvyh$ Creatinine 0.40 - 1.20 mg/dL 0.81  0.93  0.74   Sodium 135 - 145 mEq/L 137  139  142   Potassium 3.5 - 5.1 mEq/L 4.3  4.3  3.9   Chloride 96 - 112 mEq/L  100  102  106   CO2 19 - 32 mEq/L $Remove'28  24  26   'HzqymUP$ Calcium 8.4 - 10.5 mg/dL 9.8  9.5  8.9     Lab Results  Component Value Date   WBC 5.3 09/14/2020   HGB 14.1 09/14/2020   HCT 42.0 09/14/2020   MCV 91.7 09/14/2020   PLT 206.0 09/14/2020   NEUTROABS 3.4 09/14/2020    ASSESSMENT & PLAN:  Breast cancer of lower-outer quadrant of right female breast Right breast invasive lobular cancer 5.5 cm multifocal disease with 6/12 lymph nodes positive T3, N2, M0 stage IIIa, ER 92%, PR 98%, HER-2 negative ratio 1.13, Ki-67 10% status post bilateral mastectomies on 04/17/2014, status post adjuvant chemotherapy started 06/12/2014 and completed 10/23/2014, started radiation June 2016 completed August 2016. Started anastrozole 02/02/2015 7 years   Anastrozole  toxicities: Denies any hot flashes or myalgias. Tolerating it extremely well. She completed 7 years of therapy and therefore she will discontinue it in August 2023.   Breast reconstruction with latissimus dorsi flap done in December 2016 and February 2017.   Bone density 03/24/2015 (Olivia Acosta) T score -0.8 normal (we will request bone density test copy from her office) Taking Zinc, B 12 and Vit D, probiotic   Weight Gain: Seeing [facility] (sees Olivia Acosta)   Breast cancer surveillance:  1. Physical examination 11/25/21 does not reveal any abnormalities. 2. Mammogram: No role since she had Bil mastectomies  11/05/20: No MRI evidence of malignancy, non specific ST edema in left axilla ? Mass/abnormal LN, Extra capsular silicone (recommend U/S axilla)   Breast MRI: No MRI evidence of malignancy, MRI recommended an ultrasound of the left axilla.  We will order this today.  Patient is contemplating on removing the implants and will discuss with Dr. Marla Acosta about the leakage.   Return to clinic on an as-needed basis    Orders Placed This Encounter  Procedures   US BREAST LTD UNI LEFT INC AXILLA    Standing Status:   Future    Standing Expiration Date:   11/26/2022    Order Specific Question:   Reason for Exam (SYMPTOM  OR DIAGNOSIS REQUIRED)    Answer:   left axillary soft tissue edema eval    Order Specific Question:   Preferred imaging location?    Answer:   [facility]    Order Specific Question:   Release to patient    Answer:   Immediate   The patient has a good understanding of the overall plan. she agrees with it. she will call with any problems that may develop before the next visit here. Total time spent: 30 mins including face to face time and time spent for planning, charting and co-ordination of care   Olivia Ohara, MD 11/25/21    I Olivia Acosta am scribing for Dr. Lindi Acosta  I have reviewed the above documentation for accuracy and  completeness, and I agree with the above.

## 2021-11-25 ENCOUNTER — Other Ambulatory Visit: Payer: Self-pay

## 2021-11-25 ENCOUNTER — Ambulatory Visit (INDEPENDENT_AMBULATORY_CARE_PROVIDER_SITE_OTHER): Payer: Commercial Managed Care - PPO | Admitting: Psychology

## 2021-11-25 ENCOUNTER — Inpatient Hospital Stay: Payer: Commercial Managed Care - PPO | Attending: Hematology and Oncology | Admitting: Hematology and Oncology

## 2021-11-25 DIAGNOSIS — Z170 Estrogen receptor positive status [ER+]: Secondary | ICD-10-CM | POA: Diagnosis not present

## 2021-11-25 DIAGNOSIS — C50511 Malignant neoplasm of lower-outer quadrant of right female breast: Secondary | ICD-10-CM | POA: Insufficient documentation

## 2021-11-25 DIAGNOSIS — Z79811 Long term (current) use of aromatase inhibitors: Secondary | ICD-10-CM | POA: Insufficient documentation

## 2021-11-25 DIAGNOSIS — F4323 Adjustment disorder with mixed anxiety and depressed mood: Secondary | ICD-10-CM | POA: Diagnosis not present
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
## 2021-11-25 NOTE — Assessment & Plan Note (Addendum)
Right breast invasive lobular cancer 5.5 cm multifocal disease with 6/12 lymph nodes positive T3, N2, M0 stage IIIa, ER 92%, PR 98%, HER-2 negative ratio 1.13, Ki-67 10% status post bilateral mastectomies on 04/17/2014, status post adjuvant chemotherapy started 06/12/2014 and completed 10/23/2014, started radiation June 2016 completed August 2016. Started anastrozole 02/02/2015 7years  Anastrozole toxicities: Denies any hot flashes or myalgias. Tolerating it extremely well. She completed 7 years of therapy and therefore she will discontinue it in August 2023.  Breast reconstruction with latissimus dorsi flap done in December 2016 and February 2017.  Bone density 03/24/2015 (Dr.Grewall) T score -0.8 normal (we will request bone density test copy from her office) Taking Zinc, B 12 and Vit D, probiotic  Weight Gain: Seeing [facility] (sees Raoul Pitch)  Breast cancer surveillance:  1. Physical examination 11/25/21 does not reveal any abnormalities. 2. Mammogram: No role since she had Bil mastectomies  11/05/20: No MRI evidence of malignancy, non specific ST edema in left axilla ? Mass/abnormal LN, Extra capsular silicone (recommend U/S axilla)  Breast MRI: No MRI evidence of malignancy, MRI recommended an ultrasound of the left axilla.  We will order this today.  Patient is contemplating on removing the implants and will discuss with Dr. Marla Roe about the leakage.  Return to clinic on an as-needed basis

## 2021-12-23 ENCOUNTER — Ambulatory Visit (INDEPENDENT_AMBULATORY_CARE_PROVIDER_SITE_OTHER): Payer: Commercial Managed Care - PPO | Admitting: Psychology

## 2021-12-23 DIAGNOSIS — F4323 Adjustment disorder with mixed anxiety and depressed mood: Secondary | ICD-10-CM | POA: Diagnosis not present

## 2021-12-23 NOTE — Progress Notes (Signed)
[place] Counselor/Therapist Progress Note  Patient ID: Olivia Acosta, MRN: [id number],    Date: 12/23/2021  Time Spent: 45 mins  Treatment Type: Individual Therapy  Reported Symptoms: Pt presented for follow up session, via webex video.  Pt granted consent for session, stating she is in her home with no one else present.  I shared with pt that I am in my home office with no one else here.   Mental Status Exam: Appearance:  Casual     Behavior: Appropriate  Motor: Normal  Speech/Language:  Clear and Coherent  Affect: Appropriate  Mood: normal  Thought process: normal  Thought content:   WNL  Sensory/Perceptual disturbances:   WNL  Orientation: oriented to person, place, and time/date  Attention: Good  Concentration: Good  Memory: WNL  Fund of knowledge:  Good  Insight:   Good  Judgment:  Good  Impulse Control: Good   Risk Assessment: Danger to Self:  No Self-injurious Behavior: No Danger to Others: No Duty to Warn:no Physical Aggression / Violence:No  Access to Firearms a concern: No  Gang Involvement:No   Subjective: Pt shares that her company eliminated her position last week and she is out of work.  This happened last Thursday.  She shares that her revenue in her division is down and they are restructuring the division.  She only gets about 6 wks of severance pay so she will get paid through the end of this month and will get paid for vacation time as well.  Pt shares that she has not been happy with the job for about the past 6 months.  She is choosing to look at this as a good opportunity to move to another position.  She is choosing to have an employment attorney review the documentation she was given to sign.  Pt is going to take her time in finding a new position; she has lots of things to work on with her mom, her mom's home for sale, etc.  Pt shares she is in a good place with this change in her life and she knows how to manage her thoughts  and feelings about it.  Pt shares that her mom is doing well; pt is thinking about adding another day a week to go to the day program at [facility].  They are going to [facility] next weekend for a little vacation.  [name] has had 2 interviews for jobs this week; he is still in his program Administrator, Civil Service) and is doing pretty well in it.  He is seeing a Social worker and a doctor and has access to healthcare.  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  She did take him some clothes a couple of weeks ago and had a chance to talk with him and see where he is living and learn about the program.  He will be at this facility for 3 months and then he will go to a sober living house for 3 months.  Pt shares that things with [name] "have kind of been up and down recently.  I think she is still hanging out with the ex-boyfriend and he is no good."  Pt shares she continues to be belligerent towards her from time to time.  She is afraid she is still using drugs.  Carter's surgery is scheduled for 8/1.  Pt continues going to the gym regularly on M, W, and F.  She is  also continuing to volunteer for the [employer].  Encouraged pt to continue with her self care activities and we will meet in 5 wks for a follow up session.  Interventions: Cognitive Behavioral Therapy  Diagnosis:Adjustment disorder with mixed anxiety and depressed mood  Plan: Treatment Plan Strengths/Abilities:  Intelligent, Intuitive, Willing to participate in therapy Treatment Preferences:  Outpatient Individual Therapy Statement of Needs:  Patient is to use CBT, mindfulness and coping skills to help manage and/or decrease symptoms associated with their diagnosis. Symptoms:  Depressed/Irritable mood, worry, social withdrawal Problems Addressed:  Depressive thoughts, Sadness, Sleep issues, etc. Long Term Goals:  Pt to reduce overall level, frequency, and intensity of the feelings of depression/anxiety as  evidenced by decreased irritability, negative self talk, and helpless feelings from 6 to 7 days/week to 0 to 1 days/week, per client report, for at least 3 consecutive months.  Progress:  Short Term Goals:  Pt to verbally express understanding of the relationship between feelings of depression/anxiety and their impact on thinking patterns and behaviors.  Pt to verbalize an understanding of the role that distorted thinking plays in creating fears, excessive worry, and ruminations.  Progress:  Target Date:  07/29/2022 Frequency:  Bi-weekly Modality:  Cognitive Behavioral Therapy Interventions by Therapist:  Therapist will use CBT, Mindfulness exercises, Coping skills and Referrals, as needed by client. Client has verbally approved this treatment plan.  [name], [facility]

## 2021-12-28 ENCOUNTER — Other Ambulatory Visit: Payer: Self-pay | Admitting: Hematology and Oncology

## 2021-12-28 ENCOUNTER — Other Ambulatory Visit: Payer: Commercial Managed Care - PPO

## 2021-12-28 ENCOUNTER — Ambulatory Visit
Admission: RE | Admit: 2021-12-28 | Discharge: 2021-12-28 | Disposition: A | Discharge status: Home / Self Care | Payer: Commercial Managed Care - PPO | Source: Ambulatory Visit | Attending: Hematology and Oncology | Admitting: Hematology and Oncology

## 2021-12-28 DIAGNOSIS — C50511 Malignant neoplasm of lower-outer quadrant of right female breast: Secondary | ICD-10-CM

## 2022-01-27 ENCOUNTER — Ambulatory Visit (INDEPENDENT_AMBULATORY_CARE_PROVIDER_SITE_OTHER): Payer: Commercial Managed Care - PPO | Admitting: Psychology

## 2022-01-27 DIAGNOSIS — F4323 Adjustment disorder with mixed anxiety and depressed mood: Secondary | ICD-10-CM | POA: Diagnosis not present

## 2022-01-27 NOTE — Progress Notes (Signed)
[place] Counselor/Therapist Progress Note  Patient ID: Olivia Acosta, MRN: [id number],    Date: 01/27/2022  Time Spent: 45 mins  Treatment Type: Individual Therapy  Reported Symptoms: Pt presented for follow up session, via webex video.  Pt granted consent for session, stating she is in her home with no one else present.  I shared with pt that I am in my home office with no one else here.   Mental Status Exam: Appearance:  Casual     Behavior: Appropriate  Motor: Normal  Speech/Language:  Clear and Coherent  Affect: Appropriate  Mood: normal  Thought process: normal  Thought content:   WNL  Sensory/Perceptual disturbances:   WNL  Orientation: oriented to person, place, and time/date  Attention: Good  Concentration: Good  Memory: WNL  Fund of knowledge:  Good  Insight:   Good  Judgment:  Good  Impulse Control: Good   Risk Assessment: Danger to Self:  No Self-injurious Behavior: No Danger to Others: No Duty to Warn:no Physical Aggression / Violence:No  Access to Firearms a concern: No  Gang Involvement:No   Subjective: Pt shares that "my life has been a roller coaster lately.  Olivia Acosta is doing well from his surgery; he has had his stitches removed and he is able to walk daily.  He is still not able to go up stairs by himself."  Pt shares her second dog is being supportive of him as well.  Pt shares that her mom goes to her [facility] sessions twice per week and that is working for both of them very well.  She is considering having someone come into the home and help with her mom more regularly beginning in October.  Her mom is becoming more forgetful and has also started hiding things in her room.  Pt is looking for a job on [name] daily and she is also needing to get the eviction process going with her brother from her mom's property and dealing with Olivia Acosta has been hard.  Her landlord has let pt know that she was being evicted.  She also had to  surrender the cats to the Olivia Acosta; Olivia Acosta was not taking very good care of them.  Olivia Acosta was able to get into a sober living house last week.  Olivia Acosta has not been taking her medications and pt's insurance ends tomorrow.  Pt will be signing up for additional health insurance through the exchange.  Olivia Acosta is required to work while she is there and she is looking for a job.  Olivia Acosta came to pt's home last night for dinner and that went well.  Pt also has to work on getting her mom's home ready to sell.  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  Pt has been talking with her financial planner for next steps as well.  Pt has signed up for [name] for her health insurance.  Pt shares that she and her mom did go to [name] several weeks ago and had a nice visit.  Pt shares that Olivia Acosta called her last week and he has a fast food job that he has been working for a couple of weeks.  Pt continues going to the gym regularly on M, W, and F.  She is also continuing to volunteer for the [employer].  Encouraged pt to continue with her self care activities and we will meet in 4 wks for a follow up  session.  Interventions: Cognitive Behavioral Therapy  Diagnosis:Adjustment disorder with mixed anxiety and depressed mood  Plan: Treatment Plan Strengths/Abilities:  Intelligent, Intuitive, Willing to participate in therapy Treatment Preferences:  Outpatient Individual Therapy Statement of Needs:  Patient is to use CBT, mindfulness and coping skills to help manage and/or decrease symptoms associated with their diagnosis. Symptoms:  Depressed/Irritable mood, worry, social withdrawal Problems Addressed:  Depressive thoughts, Sadness, Sleep issues, etc. Long Term Goals:  Pt to reduce overall level, frequency, and intensity of the feelings of depression/anxiety as evidenced by decreased irritability, negative self talk, and helpless feelings from 6 to 7  days/week to 0 to 1 days/week, per client report, for at least 3 consecutive months.  Progress:  Short Term Goals:  Pt to verbally express understanding of the relationship between feelings of depression/anxiety and their impact on thinking patterns and behaviors.  Pt to verbalize an understanding of the role that distorted thinking plays in creating fears, excessive worry, and ruminations.  Progress:  Target Date:  07/29/2022 Frequency:  Bi-weekly Modality:  Cognitive Behavioral Therapy Interventions by Therapist:  Therapist will use CBT, Mindfulness exercises, Coping skills and Referrals, as needed by client. Client has verbally approved this treatment plan.  Olivia Acosta, [facility]

## 2022-02-24 ENCOUNTER — Ambulatory Visit (INDEPENDENT_AMBULATORY_CARE_PROVIDER_SITE_OTHER): Payer: Commercial Managed Care - PPO | Admitting: Psychology

## 2022-02-24 DIAGNOSIS — F4323 Adjustment disorder with mixed anxiety and depressed mood: Secondary | ICD-10-CM

## 2022-02-24 NOTE — Progress Notes (Signed)
[place] Counselor/Therapist Progress Note  Patient ID: Olivia Acosta, MRN: [id number],    Date: 02/24/2022  Time Spent: 45 mins  Treatment Type: Individual Therapy  Reported Symptoms: Pt presented for follow up session, via webex video.  Pt granted consent for session, stating she is in her home with no one else present.  I shared with pt that I am in my home office with no one else here.   Mental Status Exam: Appearance:  Casual     Behavior: Appropriate  Motor: Normal  Speech/Language:  Clear and Coherent  Affect: Appropriate  Mood: normal  Thought process: normal  Thought content:   WNL  Sensory/Perceptual disturbances:   WNL  Orientation: oriented to person, place, and time/date  Attention: Good  Concentration: Good  Memory: WNL  Fund of knowledge:  Good  Insight:   Good  Judgment:  Good  Impulse Control: Good   Risk Assessment: Danger to Self:  No Self-injurious Behavior: No Danger to Others: No Duty to Warn:no Physical Aggression / Violence:No  Access to Firearms a concern: No  Gang Involvement:No   Subjective: Pt shares that "I have been pretty busy lately.  [name] is continuing to heal from his leg surgery and is now rehabbing.  He is feeling better.  He is scheduled to go back to the vet on 10/4 for an x-ray and then in early Nov for a follow up.  He had his surgery on 8/1 so he is about 7 wks post surgery.  She is walking him twice daily and that is good for pt as well.  Pt shares that she had to have a confrontation with her brother [name]) a couple of weeks ago.  Her aunt Judson Roch passed away and pt shares she saw her brother at the funeral.  He was arguing with her and accusing her of brainwashing their mom about him.  Pt is in the process of getting him evicted from their mom's property so she can sell the property on her mom's behalf.  Richard has only paid $500.00 one time for the years that he has spent living on the property with  their mom.  [name] is also a convicted felon.  Pt shares that [name] just passed her 30 days sober mark and is still in her sober living home.  Pt shares that she told [name] she could get her car back if she stays sober for 90 days.  [name] is still in his sober living facility in [place] and is currently working 2 jobs.  Pt also shares that 2 wks ago she got a text from a former [employer].  There had been an emergency mtg led by the president and the staff were told the COO (pt's former direct boss) was fired at the beginning of Sept.  Pt is not interested in trying to get her old job back now.  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  Pt continues going to the gym regularly on M, W, and F.  She getting a pedicure and manicure for her niece's wedding next week and is going to schedule a massage for herself as well.  She is also continuing to volunteer for the [employer].  Encouraged pt to continue with her self care activities and we will meet in 4 wks for a follow up session.  Interventions: Cognitive Behavioral Therapy  Diagnosis:Adjustment disorder with mixed anxiety  and depressed mood  Plan: Treatment Plan Strengths/Abilities:  Intelligent, Intuitive, Willing to participate in therapy Treatment Preferences:  Outpatient Individual Therapy Statement of Needs:  Patient is to use CBT, mindfulness and coping skills to help manage and/or decrease symptoms associated with their diagnosis. Symptoms:  Depressed/Irritable mood, worry, social withdrawal Problems Addressed:  Depressive thoughts, Sadness, Sleep issues, etc. Long Term Goals:  Pt to reduce overall level, frequency, and intensity of the feelings of depression/anxiety as evidenced by decreased irritability, negative self talk, and helpless feelings from 6 to 7 days/week to 0 to 1 days/week, per client report, for at least 3 consecutive  months.  Progress:  Short Term Goals:  Pt to verbally express understanding of the relationship between feelings of depression/anxiety and their impact on thinking patterns and behaviors.  Pt to verbalize an understanding of the role that distorted thinking plays in creating fears, excessive worry, and ruminations.  Progress:  Target Date:  07/29/2022 Frequency:  Bi-weekly Modality:  Cognitive Behavioral Therapy Interventions by Therapist:  Therapist will use CBT, Mindfulness exercises, Coping skills and Referrals, as needed by client. Client has verbally approved this treatment plan.  [name], [facility]

## 2022-03-24 ENCOUNTER — Ambulatory Visit (INDEPENDENT_AMBULATORY_CARE_PROVIDER_SITE_OTHER): Payer: Commercial Managed Care - PPO | Admitting: Psychology

## 2022-03-24 DIAGNOSIS — F4323 Adjustment disorder with mixed anxiety and depressed mood: Secondary | ICD-10-CM | POA: Diagnosis not present

## 2022-03-24 NOTE — Progress Notes (Signed)
[place] Counselor/Therapist Progress Note  Patient ID: Olivia Acosta, MRN: [id number],    Date: 03/24/2022  Time Spent: 45 mins  Treatment Type: Individual Therapy  Reported Symptoms: Pt presented for follow up session, via webex video.  Pt granted consent for session, stating she is in her home with no one else present.  I shared with pt that I am in my office with no one else here.   Mental Status Exam: Appearance:  Casual     Behavior: Appropriate  Motor: Normal  Speech/Language:  Clear and Coherent  Affect: Appropriate  Mood: normal  Thought process: normal  Thought content:   WNL  Sensory/Perceptual disturbances:   WNL  Orientation: oriented to person, place, and time/date  Attention: Good  Concentration: Good  Memory: WNL  Fund of knowledge:  Good  Insight:   Good  Judgment:  Good  Impulse Control: Good   Risk Assessment: Danger to Self:  No Self-injurious Behavior: No Danger to Others: No Duty to Warn:no Physical Aggression / Violence:No  Access to Firearms a concern: No  Gang Involvement:No   Subjective: Pt shares that "[name] has healed from his surgery and he is doing well.  There has also been a lot that happened since our last session.  Olivia Acosta relapsed on Meth a couple of weeks ago and we took her to [facility] because she was suicidal."  Pt shares that she got kicked out of the sober living house.  She left the [facility] assessment and called pt and Olivia Acosta.  They tried to help her but she got out of the car and left again.  Olivia Acosta kept refusing treatment for a couple of days but she finally went to [facility] and stayed a couple of days.  She was discharged and transported her to [facility] (10/10) and she is in treatment there now.  Pt is hopeful that Olivia Acosta can get into longer term treatment.  Pt shares that Olivia Acosta was terrible to her and Olivia Acosta came back to [facility] and is living in a sober living house; his 53 day program in [place] ended and so  he came back to [place].  He is meeting regularly with is Engineer, manufacturing systems and has been clean for 9 months.  Pt shares that she went to court yesterday to gain permission to sell her mom's house; her brother [name]) was there and acted out during the hearing.  Pt got court approval to sell the house in 30 days. There are other legal processes to evict her brother from the property and that will be done next.  Both of Olivia Acosta's children have been trying to help him but they also support what pt is doing.  Pt shares that she has gotten a letter from [employer] about a pension payout they will be doing for pt; she is waiting for details.  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  Pt shares the [place] event is this coming Saturday and she is looking forward to that.  Pt continues going to the gym regularly on M, W, and F and is walking [place] as well.  Pt and her mom are going to [place] to see her sister and attend her nephew's wedding; they will be gone for a week.  Pt shares she is also planning to get a massage and a manicure and pedicure before going to the wedding.  Encouraged pt to continue with her self care activities  and we will meet in 4 wks for a follow up session.  Interventions: Cognitive Behavioral Therapy  Diagnosis:Adjustment disorder with mixed anxiety and depressed mood  Plan: Treatment Plan Strengths/Abilities:  Intelligent, Intuitive, Willing to participate in therapy Treatment Preferences:  Outpatient Individual Therapy Statement of Needs:  Patient is to use CBT, mindfulness and coping skills to help manage and/or decrease symptoms associated with their diagnosis. Symptoms:  Depressed/Irritable mood, worry, social withdrawal Problems Addressed:  Depressive thoughts, Sadness, Sleep issues, etc. Long Term Goals:  Pt to reduce overall level, frequency, and intensity of the feelings of depression/anxiety as evidenced by  decreased irritability, negative self talk, and helpless feelings from 6 to 7 days/week to 0 to 1 days/week, per client report, for at least 3 consecutive months.  Progress:  Short Term Goals:  Pt to verbally express understanding of the relationship between feelings of depression/anxiety and their impact on thinking patterns and behaviors.  Pt to verbalize an understanding of the role that distorted thinking plays in creating fears, excessive worry, and ruminations.  Progress:  Target Date:  07/29/2022 Frequency:  Bi-weekly Modality:  Cognitive Behavioral Therapy Interventions by Therapist:  Therapist will use CBT, Mindfulness exercises, Coping skills and Referrals, as needed by client. Client has verbally approved this treatment plan.  [name], [facility]

## 2022-04-21 ENCOUNTER — Telehealth: Payer: Self-pay | Admitting: Psychology

## 2022-04-21 ENCOUNTER — Ambulatory Visit (INDEPENDENT_AMBULATORY_CARE_PROVIDER_SITE_OTHER): Payer: Commercial Managed Care - PPO | Admitting: Psychology

## 2022-04-21 DIAGNOSIS — F4323 Adjustment disorder with mixed anxiety and depressed mood: Secondary | ICD-10-CM

## 2022-04-21 NOTE — Progress Notes (Signed)
[place] Counselor/Therapist Progress Note  Patient ID: Olivia Acosta, MRN: [id number],    Date: 04/21/2022  Time Spent: 45 mins  Treatment Type: Individual Therapy  Reported Symptoms: Pt presented for follow up session, via webex video.  Pt granted consent for session, stating she is in her home with no one else present.  I shared with pt that I am in my office with no one else here.   Mental Status Exam: Appearance:  Casual     Behavior: Appropriate  Motor: Normal  Speech/Language:  Clear and Coherent  Affect: Appropriate  Mood: normal  Thought process: normal  Thought content:   WNL  Sensory/Perceptual disturbances:   WNL  Orientation: oriented to person, place, and time/date  Attention: Good  Concentration: Good  Memory: WNL  Fund of knowledge:  Good  Insight:   Good  Judgment:  Good  Impulse Control: Good   Risk Assessment: Danger to Self:  No Self-injurious Behavior: No Danger to Others: No Duty to Warn:no Physical Aggression / Violence:No  Access to Firearms a concern: No  Gang Involvement:No   Subjective: Pt shares that "I have been good since our last session."  Pt shares that she is really busy with her mom and what is going with [name], it would be impossible for her to be working right now too.  Since she was let go in July, she has used her time wisely and has really benefited from having this extra time away from work.  Pt shares that her attorney is still trying to get her brother to respond to him about the pieces of the farm that he has sold without his mom's permission.  The court has given pt permission to sell the home for her mom's benefit.  Her brother is still in the building behind the home.  He has until this coming Saturday to vacate the premises.  Pt does not believe that he will be out by then.  Pt shares she went to the home with her attorney and a real estate agent and [name] met them there and was his normal self.   She is putting up the house for sale as soon as [name] gets off the property.  She is hoping to list it by next month.  Pt shares that [name] is still in his sober living house, is working his Architect job, is seeing his Engineer, manufacturing systems monthly and is passing his drug screens as well.  [name] left [place] before the end of her treatment.  She showed up at pt's home at 8pm on night and pt made her look for a sober living house; she found one in [place] and now has a job in [place] as well, working for [employer].  She is riding the bus to and from work and is asking for her car back now.  Pt shares that [name] seems to be making efforts toward recovery.   Pt shares that she and her mom went to [place] on 11[place]1 for her nephew's wedding and had a great time for the week.  They had a few extra days to visit with family and that meant a lot to pt's mom.  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  Pt continues going to the gym regularly on M, W, and F and is walking [place] as well.  Encouraged pt to continue with her self care activities and we will meet in 4 wks  for a follow up session.  Interventions: Cognitive Behavioral Therapy  Diagnosis:Adjustment disorder with mixed anxiety and depressed mood  Plan: Treatment Plan Strengths/Abilities:  Intelligent, Intuitive, Willing to participate in therapy Treatment Preferences:  Outpatient Individual Therapy Statement of Needs:  Patient is to use CBT, mindfulness and coping skills to help manage and/or decrease symptoms associated with their diagnosis. Symptoms:  Depressed/Irritable mood, worry, social withdrawal Problems Addressed:  Depressive thoughts, Sadness, Sleep issues, etc. Long Term Goals:  Pt to reduce overall level, frequency, and intensity of the feelings of depression/anxiety as evidenced by decreased irritability, negative self talk, and helpless feelings from 6 to 7 days/week to 0 to 1  days/week, per client report, for at least 3 consecutive months.  Progress:  Short Term Goals:  Pt to verbally express understanding of the relationship between feelings of depression/anxiety and their impact on thinking patterns and behaviors.  Pt to verbalize an understanding of the role that distorted thinking plays in creating fears, excessive worry, and ruminations.  Progress:  Target Date:  07/29/2022 Frequency:  Bi-weekly Modality:  Cognitive Behavioral Therapy Interventions by Therapist:  Therapist will use CBT, Mindfulness exercises, Coping skills and Referrals, as needed by client. Client has verbally approved this treatment plan.  [name], [facility]

## 2022-04-21 NOTE — Telephone Encounter (Signed)
Pt says she is having difficulty logging in and could you please call her directly?

## 2022-05-19 ENCOUNTER — Ambulatory Visit (INDEPENDENT_AMBULATORY_CARE_PROVIDER_SITE_OTHER): Payer: Commercial Managed Care - PPO | Admitting: Psychology

## 2022-05-19 DIAGNOSIS — F4323 Adjustment disorder with mixed anxiety and depressed mood: Secondary | ICD-10-CM

## 2022-05-19 NOTE — Progress Notes (Signed)
[place] Counselor/Therapist Progress Note  Patient ID: Olivia Acosta, MRN: [id number],    Date: 05/19/2022  Time Spent: 60 mins  Treatment Type: Individual Therapy  Reported Symptoms: Pt presented for follow up session, via webex video.  Pt granted consent for session, stating she is in her home with no one else present.  I shared with pt that I am in my office with no one else here.   Mental Status Exam: Appearance:  Casual     Behavior: Appropriate  Motor: Normal  Speech/Language:  Clear and Coherent  Affect: Appropriate  Mood: normal  Thought process: normal  Thought content:   WNL  Sensory/Perceptual disturbances:   WNL  Orientation: oriented to person, place, and time/date  Attention: Good  Concentration: Good  Memory: WNL  Fund of knowledge:  Good  Insight:   Good  Judgment:  Good  Impulse Control: Good   Risk Assessment: Danger to Self:  No Self-injurious Behavior: No Danger to Others: No Duty to Warn:no Physical Aggression / Violence:No  Access to Firearms a concern: No  Gang Involvement:No   Subjective: Pt shares that "I have been good since our last session.  I have been busy."  Pt shares that her brother finally left her mom's property and she had the locks changed on all the locks at her mom's home.  She has been there several times to begin cleaning out things at the home.  [name] helped her a couple of days as well.  She is planning to give lots of stuff to [employer], [employer], etc.  Pt has the kitchen and a small part of the attic left to go through.  She is keeping some stuff and giving things to Ila Mcgill, and her niece and nephew.  Pt shares that [name] got her car back on Thanksgiving and she came to [facility] for a doctor's appt and that next weekend she started calling pt and she was on drugs again; [name] is going to [place] tomorrow to enter a treatment program there.  Pt has taken her car back now as well.  She  relapsed again on meth.  Pt has told [name] that this is the last time she is willing to take her to treatment; "she has to figure it out at this point."  Pt heard from a former coworker who invited her to apply with them for a position after the first of the year.  Encouraged pt to continue with her self care activities and we will meet in 6 wks for a follow up session.  Interventions: Cognitive Behavioral Therapy  Diagnosis:Adjustment disorder with mixed anxiety and depressed mood  Plan: Treatment Plan Strengths/Abilities:  Intelligent, Intuitive, Willing to participate in therapy Treatment Preferences:  Outpatient Individual Therapy Statement of Needs:  Patient is to use CBT, mindfulness and coping skills to help manage and/or decrease symptoms associated with their diagnosis. Symptoms:  Depressed/Irritable mood, worry, social withdrawal Problems Addressed:  Depressive thoughts, Sadness, Sleep issues, etc. Long Term Goals:  Pt to reduce overall level, frequency, and intensity of the feelings of depression/anxiety as evidenced by decreased irritability, negative self talk, and helpless feelings from 6 to 7 days/week to 0 to 1 days/week, per client report, for at least 3 consecutive months.  Progress:  Short Term Goals:  Pt to verbally express understanding of the relationship between feelings of depression/anxiety and their impact on thinking patterns and behaviors.  Pt to verbalize an understanding of the role that distorted thinking plays in  creating fears, excessive worry, and ruminations.  Progress:  Target Date:  07/29/2022 Frequency:  Bi-weekly Modality:  Cognitive Behavioral Therapy Interventions by Therapist:  Therapist will use CBT, Mindfulness exercises, Coping skills and Referrals, as needed by client. Client has verbally approved this treatment plan.  [name], [facility]

## 2022-06-30 ENCOUNTER — Ambulatory Visit (INDEPENDENT_AMBULATORY_CARE_PROVIDER_SITE_OTHER): Payer: Commercial Managed Care - PPO | Admitting: Psychology

## 2022-06-30 DIAGNOSIS — F4323 Adjustment disorder with mixed anxiety and depressed mood: Secondary | ICD-10-CM | POA: Diagnosis not present

## 2022-06-30 NOTE — Progress Notes (Signed)
[place] [facility] Counselor/Therapist Progress Note  Patient ID: Olivia Acosta, MRN: [id number],    Date: 06/30/2022  Time Spent: 60 mins  Treatment Type: Individual Therapy  Reported Symptoms: Pt presented for follow up session, via webex video.  Pt granted consent for session, stating she is in her home with no one else present.  I shared with pt that I am in my office with no one else here.   Mental Status Exam: Appearance:  Casual     Behavior: Appropriate  Motor: Normal  Speech/Language:  Clear and Coherent  Affect: Appropriate  Mood: normal  Thought process: normal  Thought content:   WNL  Sensory/Perceptual disturbances:   WNL  Orientation: oriented to person, place, and time/date  Attention: Good  Concentration: Good  Memory: WNL  Fund of knowledge:  Good  Insight:   Good  Judgment:  Good  Impulse Control: Good   Risk Assessment: Danger to Self:  No Self-injurious Behavior: No Danger to Others: No Duty to Warn:no Physical Aggression / Violence:No  Access to Firearms a concern: No  Gang Involvement:No   Subjective: Pt shares that "I had an interview on Monday with a background screening company and it sounded like it would be a very intense setting and I am not sure that I am up for that level of intensity."  Pt shares she realizes that she will be 62 yo in June and feels like it is time to start winding down her career.  Pt is shooting to get her mom's house on the market by next month.  Pt has been to the dentist since our last session and she also had to take her mom to the dentist in [place].  She knows that she will have to provide more and more time for her mom's care moving forward in time.  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  [name] is doing well at the [facility] in [place], [place]; she just past her 30 day sober mark;  she is Research scientist (physical sciences) and is going to apply for jobs at [employer].  She is going to lots of mtgs, but mostly they are AA instead of NA.  [name] has only two more monthly mtgs with his probation officer and then he will be finished (2/2 and 3/1) with his probation.  Her mom is doing well, given her age.  Pt continues to go to the gym regularly.  Pt is planning to go to the beach to see her friend, Judeen Hammans, in late Feb for a long weekend.  Pt is planning to work on puzzles with her mom soon too.  Encouraged pt to continue with her self care activities and we will meet in 8 wks for a follow up session.  Interventions: Cognitive Behavioral Therapy  Diagnosis:Adjustment disorder with mixed anxiety and depressed mood  Plan: Treatment Plan Strengths/Abilities:  Intelligent, Intuitive, Willing to participate in therapy Treatment Preferences:  Outpatient Individual Therapy Statement of Needs:  Patient is to use CBT, mindfulness and coping skills to help manage and/or decrease symptoms associated with their diagnosis. Symptoms:  Depressed/Irritable mood, worry, social withdrawal Problems Addressed:  Depressive thoughts, Sadness, Sleep issues, etc. Long Term Goals:  Pt to reduce overall level, frequency, and intensity of the feelings of depression/anxiety as evidenced by decreased irritability, negative self talk, and helpless feelings from  6 to 7 days/week to 0 to 1 days/week, per client report, for at least 3 consecutive months.  Progress:  Short Term Goals:  Pt to verbally express understanding of the relationship between feelings of depression/anxiety and their impact on thinking patterns and behaviors.  Pt to verbalize an understanding of the role that distorted thinking plays in creating fears, excessive worry, and ruminations.  Progress:  Target Date:  07/30/2023 Frequency:  Bi-weekly Modality:  Cognitive Behavioral Therapy Interventions by Therapist:  Therapist will use CBT, Mindfulness  exercises, Coping skills and Referrals, as needed by client. Client has verbally approved this treatment plan.  [name], [facility]

## 2022-08-09 ENCOUNTER — Ambulatory Visit: Payer: BC Managed Care – PPO | Admitting: Psychology

## 2022-08-09 DIAGNOSIS — F4323 Adjustment disorder with mixed anxiety and depressed mood: Secondary | ICD-10-CM | POA: Diagnosis not present
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
## 2022-09-01 ENCOUNTER — Ambulatory Visit: Payer: BC Managed Care – PPO | Admitting: Psychology

## 2022-09-01 DIAGNOSIS — F4323 Adjustment disorder with mixed anxiety and depressed mood: Secondary | ICD-10-CM
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
## 2022-10-19 ENCOUNTER — Ambulatory Visit (INDEPENDENT_AMBULATORY_CARE_PROVIDER_SITE_OTHER): Payer: BC Managed Care – PPO | Admitting: Psychology

## 2022-10-19 DIAGNOSIS — F4323 Adjustment disorder with mixed anxiety and depressed mood: Secondary | ICD-10-CM
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
## 2022-12-02 ENCOUNTER — Ambulatory Visit (INDEPENDENT_AMBULATORY_CARE_PROVIDER_SITE_OTHER): Payer: BC Managed Care – PPO | Admitting: Family Medicine

## 2022-12-02 ENCOUNTER — Encounter: Payer: Self-pay | Admitting: Family Medicine

## 2022-12-02 VITALS — BP 110/68 | HR 67 | Temp 98.0°F | Ht 65.75 in | Wt 156.0 lb

## 2022-12-02 DIAGNOSIS — Z0000 Encounter for general adult medical examination without abnormal findings: Secondary | ICD-10-CM | POA: Diagnosis not present

## 2022-12-02 DIAGNOSIS — Z1159 Encounter for screening for other viral diseases: Secondary | ICD-10-CM | POA: Diagnosis not present

## 2022-12-02 DIAGNOSIS — R7303 Prediabetes: Secondary | ICD-10-CM

## 2022-12-02 DIAGNOSIS — E039 Hypothyroidism, unspecified: Secondary | ICD-10-CM | POA: Diagnosis not present

## 2022-12-02 DIAGNOSIS — E782 Mixed hyperlipidemia: Secondary | ICD-10-CM | POA: Diagnosis not present

## 2022-12-02 DIAGNOSIS — M19041 Primary osteoarthritis, right hand: Secondary | ICD-10-CM

## 2022-12-02 LAB — COMPREHENSIVE METABOLIC PANEL
ALT: 21 U/L (ref 0–35)
AST: 27 U/L (ref 0–37)
Albumin: 4.7 g/dL (ref 3.5–5.2)
Alkaline Phosphatase: 66 U/L (ref 39–117)
BUN: 16 mg/dL (ref 6–23)
CO2: 28 mEq/L (ref 19–32)
Calcium: 10.2 mg/dL (ref 8.4–10.5)
Chloride: 102 mEq/L (ref 96–112)
Creatinine, Ser: 0.78 mg/dL (ref 0.40–1.20)
GFR: 82.77 mL/min (ref >60.00)
Glucose, Bld: 85 mg/dL (ref 70–99)
Potassium: 4.6 mEq/L (ref 3.5–5.1)
Sodium: 135 mEq/L (ref 135–145)
Total Bilirubin: 0.5 mg/dL (ref 0.2–1.2)
Total Protein: 7.8 g/dL (ref 6.0–8.3)

## 2022-12-02 LAB — CBC WITH DIFFERENTIAL/PLATELET
Basophils Absolute: 0 10*3/uL (ref 0.0–0.1)
Basophils Relative: 1.1 % (ref 0.0–3.0)
Eosinophils Absolute: 0.1 10*3/uL (ref 0.0–0.7)
Eosinophils Relative: 2.6 % (ref 0.0–5.0)
HCT: 43.6 % (ref 36.0–46.0)
Hemoglobin: 14.3 g/dL (ref 12.0–15.0)
Lymphocytes Relative: 24.9 % (ref 12.0–46.0)
Lymphs Abs: 1.1 10*3/uL (ref 0.7–4.0)
MCHC: 32.8 g/dL (ref 30.0–36.0)
MCV: 94.3 fl (ref 78.0–100.0)
Monocytes Absolute: 0.4 10*3/uL (ref 0.1–1.0)
Monocytes Relative: 7.9 % (ref 3.0–12.0)
Neutro Abs: 2.8 10*3/uL (ref 1.4–7.7)
Neutrophils Relative %: 63.5 % (ref 43.0–77.0)
Platelets: 234 10*3/uL (ref 150.0–400.0)
RBC: 4.62 Mil/uL (ref 3.87–5.11)
RDW: 14.7 % (ref 11.5–15.5)
WBC: 4.5 10*3/uL (ref 4.0–10.5)

## 2022-12-02 LAB — LIPID PANEL
Cholesterol: 232 mg/dL — ABNORMAL HIGH (ref 0–200)
HDL: 71.6 mg/dL (ref >39.00)
LDL Cholesterol: 144 mg/dL — ABNORMAL HIGH (ref 0–99)
NonHDL: 160.56
Total CHOL/HDL Ratio: 3
Triglycerides: 82 mg/dL (ref 0.0–149.0)
VLDL: 16.4 mg/dL (ref 0.0–40.0)

## 2022-12-02 LAB — HEMOGLOBIN A1C: Hgb A1c MFr Bld: 5.6 % (ref 4.6–6.5)

## 2022-12-02 LAB — TSH: TSH: 1.26 u[IU]/mL (ref 0.35–5.50)

## 2022-12-02 NOTE — Progress Notes (Signed)
Established Patient Office Visit   Subjective  Patient ID: Olivia Acosta, female    DOB: 06-24-1960  Age: 62 y.o. MRN: [id number]  Chief Complaint  Patient presents with   Annual Exam    Patient is a 62 year old female seen for CPE and follow-up.  Patient states she was finally able to stop breast cancer medication after 7 years.  Patient exercising and making diet changes over the last few years.  Has lost 30 pounds.  Patient caring for her mother.  Patient notes edema and intermittent discomfort in right second digit.  Patient notes several family members with arthritis in hands.  Patient had shingles vaccine in January and April 2024 as well as an updated COVID-vaccine.  Patient also followed by [facility].    Past Medical History:  Diagnosis Date   Arthritis    fingers stiff   Breast cancer (HCC)    R breast- 2015   Bronchitis 2016   treated /w IV antibiotics, admitted for 2 nights [facility]   GERD (gastroesophageal reflux disease)    Neuropathy    both feet since chemotherapy   Thyroid disease    UTI (urinary tract infection)    Past Surgical History:  Procedure Laterality Date   BREAST RECONSTRUCTION Bilateral 04/17/2014   BREAST RECONSTRUCTION Right 07/23/2015   Procedure: RIGHT CHEST WOUND DEBRIDEMENT, RIGHT BREAST RECONSTRUCTION WITH SILICONE GEL IMPLANT;  Surgeon: Etter Sjogren, MD;  Location: [facility] OR;  Service: Plastics;  Laterality: Right;   BREAST RECONSTRUCTION WITH PLACEMENT OF TISSUE EXPANDER AND FLEX HD (ACELLULAR HYDRATED DERMIS) Bilateral 04/17/2014   Procedure: BILATERAL BREAST RECONSTRUCTION WITH PLACEMENT OF TISSUE EXPANDER AND FLEX HD (ACELLULAR HYDRATED DERMIS);  Surgeon: Etter Sjogren, MD;  Location: [facility] OR;  Service: Plastics;  Laterality: Bilateral;   COMPLETE MASTECTOMY W/ SENTINEL NODE BIOPSY Bilateral 04/17/2014   NIPPLE SPARING RECONSTRUCTION   LATISSIMUS FLAP TO BREAST Right 07/23/2015   Procedure: LATISSIMUS FLAP TO RIGHT  CHEST;  Surgeon: Etter Sjogren, MD;  Location: [facility] OR;  Service: Plastics;  Laterality: Right;   MASTECTOMY W/ SENTINEL NODE BIOPSY Bilateral 04/17/2014   Procedure: BILATERAL NIPPLE SPARING MASTECTOMY WITH SENTINEL LYMPH NODE BIOPSY;  Surgeon: Glenna Fellows, MD;  Location: [facility] OR;  Service: General;  Laterality: Bilateral;   PORT A CATH REVISION Right 05/27/2014   Procedure: PORT A CATH REVISION;  Surgeon: Glenna Fellows, MD;  Location: [facility];  Service: General;  Laterality: Right;   PORT-A-CATH REMOVAL Right 06/04/2015   Procedure: REMOVAL PORT-A-CATH;  Surgeon: Etter Sjogren, MD;  Location: [facility] OR;  Service: Plastics;  Laterality: Right;   PORTACATH PLACEMENT Left 05/27/2014   Procedure: INSERTION PORT-A-CATH;  Surgeon: Glenna Fellows, MD;  Location: [facility];  Service: General;  Laterality: Left;   REMOVAL OF TISSUE EXPANDER AND PLACEMENT OF IMPLANT Bilateral 06/04/2015   Procedure: REMOVAL OF BILATERAL TISSUE EXPANDER AND DELAYED BREAST RECONSTRUCTION PLACEMENT OF IMPLANTS;  Surgeon: Etter Sjogren, MD;  Location: [facility] OR;  Service: Plastics;  Laterality: Bilateral;   SALIVARY STONE REMOVAL  2009   stricture of the uretha  1975   TONSILLECTOMY     TONSILLECTOMY AND ADENOIDECTOMY  1974   URETHRAL STRICTURE DILATATION     Social History   Tobacco Use   Smoking status: Never   Smokeless tobacco: Never  Substance Use Topics   Alcohol use: Yes    Comment: occ wine   Drug use: No   Family History  Problem Relation Age of Onset   Miscarriages /  Stillbirths Mother    Cancer Father    COPD Father    Hyperlipidemia Father    Cancer Maternal Grandmother    Cancer Paternal Grandmother    ADD / ADHD Paternal Grandmother    Kidney disease Brother    Stroke Brother    Depression Daughter    Drug abuse Daughter    Depression Son    Drug abuse Son    Heart attack Maternal Grandfather    ADD / ADHD Paternal Grandfather    Allergies  Allergen  Reactions   Ciprofloxacin Other (See Comments)    Arm that the iv was given in begin to swell   Penicillins Hives and Itching    Has patient had a PCN reaction causing immediate rash, facial/tongue/throat swelling, SOB or lightheadedness with hypotension: Yes Has patient had a PCN reaction causing severe rash involving mucus membranes or skin necrosis: No Has patient had a PCN reaction that required hospitalization: No Has patient had a PCN reaction occurring within the last 10 years: No If all of the above answers are "NO", then may proceed with Cephalosporin use.       ROS Negative unless stated above    Objective:     BP 110/68 (BP Location: Left Arm, Patient Position: Sitting, Cuff Size: Normal)   Pulse 67   Temp 98 F (36.7 C) (Oral)   Ht 5' 5.75" (1.67 m)   Wt 156 lb (70.8 kg)   LMP 05/28/2011   SpO2 98%   BMI 25.37 kg/m    Physical Exam Constitutional:      Appearance: Normal appearance.  HENT:     Head: Normocephalic and atraumatic.     Right Ear: Tympanic membrane, ear canal and external ear normal.     Left Ear: Tympanic membrane, ear canal and external ear normal.     Nose: Nose normal.     Mouth/Throat:     Mouth: Mucous membranes are moist.     Pharynx: No oropharyngeal exudate or posterior oropharyngeal erythema.  Eyes:     General: No scleral icterus.    Extraocular Movements: Extraocular movements intact.     Conjunctiva/sclera: Conjunctivae normal.     Pupils: Pupils are equal, round, and reactive to light.  Neck:     Thyroid: No thyromegaly.  Cardiovascular:     Rate and Rhythm: Normal rate and regular rhythm.     Pulses: Normal pulses.     Heart sounds: Normal heart sounds. No murmur heard.    No friction rub.  Pulmonary:     Effort: Pulmonary effort is normal.     Breath sounds: Normal breath sounds. No wheezing, rhonchi or rales.  Abdominal:     General: Bowel sounds are normal.     Palpations: Abdomen is soft.     Tenderness: There is  no abdominal tenderness.  Musculoskeletal:        General: No deformity. Normal range of motion.     Comments: Enlargement of right second digit PIP joint and first digit MTP joint.  No TTP.  Lymphadenopathy:     Cervical: No cervical adenopathy.  Skin:    General: Skin is warm and dry.     Findings: No lesion.  Neurological:     General: No focal deficit present.     Mental Status: She is alert and oriented to person, place, and time.  Psychiatric:        Mood and Affect: Mood normal.        Thought  Content: Thought content normal.      No results found for any visits on 12/02/22.    Assessment & Plan:  Well adult exam -     CBC with Differential/Platelet  Acquired hypothyroidism -     TSH  Prediabetes -     Hemoglobin A1c  Mixed hyperlipidemia -     Lipid panel -     Comprehensive metabolic panel  Primary osteoarthritis of right hand  Encounter for hepatitis C screening test for low risk patient -     Hepatitis C antibody  Age-appropriate health screenings discussed.  Immunizations reviewed.  Patient will update vaccine dates.  Pt schedule mammogram.  Colonoscopy due next month.  Previously done 08/09/2019 with 3-year recall.  Lifestyle modifications.  Discussed supportive care for OA.  Return if symptoms worsen or fail to improve.  Next CPE in 1 year.  Deeann Saint, MD

## 2022-12-03 LAB — HEPATITIS C ANTIBODY: Hepatitis C Ab: NONREACTIVE

## 2022-12-21 ENCOUNTER — Ambulatory Visit (INDEPENDENT_AMBULATORY_CARE_PROVIDER_SITE_OTHER): Payer: BC Managed Care – PPO | Admitting: Psychology

## 2022-12-21 DIAGNOSIS — F4323 Adjustment disorder with mixed anxiety and depressed mood: Secondary | ICD-10-CM
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
## 2023-01-13 DIAGNOSIS — Z86010 Personal history of colonic polyps: Secondary | ICD-10-CM | POA: Diagnosis not present

## 2023-01-13 DIAGNOSIS — K648 Other hemorrhoids: Secondary | ICD-10-CM | POA: Diagnosis not present

## 2023-01-13 DIAGNOSIS — K5730 Diverticulosis of large intestine without perforation or abscess without bleeding: Secondary | ICD-10-CM | POA: Diagnosis not present

## 2023-02-22 ENCOUNTER — Ambulatory Visit (INDEPENDENT_AMBULATORY_CARE_PROVIDER_SITE_OTHER): Payer: BC Managed Care – PPO | Admitting: Psychology

## 2023-02-22 DIAGNOSIS — F4323 Adjustment disorder with mixed anxiety and depressed mood: Secondary | ICD-10-CM
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
## 2023-03-30 DIAGNOSIS — R7309 Other abnormal glucose: Secondary | ICD-10-CM | POA: Diagnosis not present

## 2023-03-30 DIAGNOSIS — E538 Deficiency of other specified B group vitamins: Secondary | ICD-10-CM | POA: Diagnosis not present

## 2023-03-30 DIAGNOSIS — E559 Vitamin D deficiency, unspecified: Secondary | ICD-10-CM | POA: Diagnosis not present

## 2023-03-30 DIAGNOSIS — E063 Autoimmune thyroiditis: Secondary | ICD-10-CM | POA: Diagnosis not present

## 2023-03-30 DIAGNOSIS — E039 Hypothyroidism, unspecified: Secondary | ICD-10-CM | POA: Diagnosis not present

## 2023-04-24 DIAGNOSIS — R09A2 Foreign body sensation, throat: Secondary | ICD-10-CM | POA: Diagnosis not present

## 2023-04-24 DIAGNOSIS — Z853 Personal history of malignant neoplasm of breast: Secondary | ICD-10-CM | POA: Diagnosis not present

## 2023-04-24 DIAGNOSIS — E063 Autoimmune thyroiditis: Secondary | ICD-10-CM | POA: Diagnosis not present

## 2023-04-24 DIAGNOSIS — R7309 Other abnormal glucose: Secondary | ICD-10-CM | POA: Diagnosis not present

## 2023-05-24 ENCOUNTER — Ambulatory Visit: Payer: BC Managed Care – PPO | Admitting: Psychology

## 2023-05-24 DIAGNOSIS — F4323 Adjustment disorder with mixed anxiety and depressed mood: Secondary | ICD-10-CM
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
## 2023-07-26 ENCOUNTER — Ambulatory Visit: Payer: BC Managed Care – PPO | Admitting: Psychology

## 2023-07-26 DIAGNOSIS — F4323 Adjustment disorder with mixed anxiety and depressed mood: Secondary | ICD-10-CM

## 2023-07-26 NOTE — Progress Notes (Signed)
[facility] Counselor/Therapist Progress Note  Patient ID: Olivia Acosta, MRN: [id number],    Date: 07/26/2023  Time Spent: 60 mins  start time: 1100    end time:  1200  Treatment Type: Individual Therapy  Reported Symptoms: Pt presented for follow up session, via Caregility video.  Pt granted consent for session, stating she is in her home with no one else present; pt also understands the limits of virtual sessions.  I shared with pt that I am in my office with no one else here.   Mental Status Exam: Appearance:  Casual     Behavior: Appropriate  Motor: Normal  Speech/Language:  Clear and Coherent  Affect: Appropriate  Mood: normal  Thought process: normal  Thought content:   WNL  Sensory/Perceptual disturbances:   WNL  Orientation: oriented to person, place, and time/date  Attention: Good  Concentration: Good  Memory: WNL  Fund of knowledge:  Good  Insight:   Good  Judgment:  Good  Impulse Control: Good   Risk Assessment: Danger to Self:  No Self-injurious Behavior: No Danger to Others: No Duty to Warn:no Physical Aggression / Violence:No  Access to Firearms a concern: No  Gang Involvement:No   Subjective: Pt shares that "I have been doing OK since our last session.  I have a trip coming up at the end of March to visit a former work friend in [place]; they live between [place] and [place].  My mom and I are going to a family wedding on 80/5 in [place], [place] and we will get to visit with family there.  I am also going to a music festival in May."  Pt shares that her brother has been texting her again and wanting things from her.  She has met him in [place] for him to be able to see their mom.  They then met pt's nephew, niece, and him in [place] at [employer] and pt enjoyed seeing her niece and nephew.  He sent her the most recent text yesterday and he wanted to come again to her home.  He was saying in the text that he is sorry for all of the bad things  he had done to her and their mom.  Pt is still not trusting him.  Pt is not sure where Chrissie Noa is right now; he was calling her from someone else's phone in mid-January after he got out of jail most recently.  Pt shares that her mom (33 yo) is still living with her; her mom has been smoking since she was 38 yo and she has recently stopped smoking with pt's help.  Her mom's BP has been pretty high and she was coughing a lot prior to stopping smoking.  Both of those things have been getting better since she has not been smoking.  Talked with pt about the Medicare program that includes a free CT scan for former smokers.  Pt shares that Colon Branch is doing OK as well.  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  She is going to NA mtgs and that is helpful for her.  She continues to battle depression and normally stops by the home weekly.  Colon Branch is concerned about her bills and is trying to get as many of them paid off as she can.  Pt has encouraged Colon Branch to spend more time on the [employer] campuses in an effort to meet people and try to find out  what sorts of clubs are available for her to join.  Colon Branch also has an appt next week with her PCP and pt has encouraged her to be honest with her doctor about what is going on with her.  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  Pt wants to become a tour guide and she may want to volunteer for [facility] [employer] as well.  Pt shares she continues to sleep well although she normally wakes up at least once per night.  Encouraged pt to continue with her self care activities and we will meet in 12 wks for a follow up session.  Interventions: Cognitive Behavioral Therapy  Diagnosis:Adjustment disorder with mixed anxiety and depressed mood  Plan: Treatment Plan Strengths/Abilities:  Intelligent, Intuitive, Willing to participate in therapy Treatment  Preferences:  Outpatient Individual Therapy Statement of Needs:  Patient is to use CBT, mindfulness and coping skills to help manage and/or decrease symptoms associated with their diagnosis. Symptoms:  Depressed/Irritable mood, worry, social withdrawal Problems Addressed:  Depressive thoughts, Sadness, Sleep issues, etc. Long Term Goals:  Pt to reduce overall level, frequency, and intensity of the feelings of depression/anxiety as evidenced by decreased irritability, negative self talk, and helpless feelings from 6 to 7 days/week to 0 to 1 days/week, per client report, for at least 3 consecutive months.  Progress: 30% Short Term Goals:  Pt to verbally express understanding of the relationship between feelings of depression/anxiety and their impact on thinking patterns and behaviors.  Pt to verbalize an understanding of the role that distorted thinking plays in creating fears, excessive worry, and ruminations.  Progress: 30% Target Date:  07/30/2023 Frequency:  Bi-weekly Modality:  Cognitive Behavioral Therapy Interventions by Therapist:  Therapist will use CBT, Mindfulness exercises, Coping skills and Referrals, as needed by client. Client has verbally approved this treatment plan.  Karie Kirks, [facility]

## 2023-08-01 DIAGNOSIS — Z6826 Body mass index (BMI) 26.0-26.9, adult: Secondary | ICD-10-CM | POA: Diagnosis not present

## 2023-08-01 DIAGNOSIS — Z13820 Encounter for screening for osteoporosis: Secondary | ICD-10-CM | POA: Diagnosis not present

## 2023-08-01 DIAGNOSIS — Z01419 Encounter for gynecological examination (general) (routine) without abnormal findings: Secondary | ICD-10-CM | POA: Diagnosis not present

## 2023-08-01 DIAGNOSIS — Z124 Encounter for screening for malignant neoplasm of cervix: Secondary | ICD-10-CM | POA: Diagnosis not present

## 2023-10-04 ENCOUNTER — Ambulatory Visit: Payer: BC Managed Care – PPO | Admitting: Psychology

## 2023-10-11 ENCOUNTER — Ambulatory Visit: Admitting: Psychology

## 2023-10-11 DIAGNOSIS — F4323 Adjustment disorder with mixed anxiety and depressed mood: Secondary | ICD-10-CM
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
## 2024-01-03 ENCOUNTER — Ambulatory Visit: Admitting: Psychology

## 2024-01-03 DIAGNOSIS — F4323 Adjustment disorder with mixed anxiety and depressed mood: Secondary | ICD-10-CM
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
## 2024-02-14 ENCOUNTER — Ambulatory Visit: Admitting: Psychology

## 2024-02-14 DIAGNOSIS — F4323 Adjustment disorder with mixed anxiety and depressed mood: Secondary | ICD-10-CM | POA: Diagnosis not present

## 2024-02-14 NOTE — Progress Notes (Signed)
 [facility] Counselor/Therapist Progress Note  Patient ID: Olivia Acosta, MRN: [id number],    Date: 02/14/2024  Time Spent: 60 mins  start time: 1100    end time:  1200  Treatment Type: Individual Therapy  Reported Symptoms: Pt presented for follow up session, via Caregility video.  Pt granted consent for session, stating she is in her home with no one else present; pt also understands the limits of virtual sessions.  I shared with pt that I am in my office with no one else here.   Mental Status Exam: Appearance:  Casual     Behavior: Appropriate  Motor: Normal  Speech/Language:  Clear and Coherent  Affect: Appropriate  Mood: normal  Thought process: normal  Thought content:   WNL  Sensory/Perceptual disturbances:   WNL  Orientation: oriented to person, place, and time/date  Attention: Good  Concentration: Good  Memory: WNL  Fund of knowledge:  Good  Insight:   Good  Judgment:  Good  Impulse Control: Good   Risk Assessment: Danger to Self:  No Self-injurious Behavior: No Danger to Others: No Duty to Warn:no Physical Aggression / Violence:No  Access to Firearms a concern: No  Gang Involvement:No   Subjective: Pt shares that I have been doing pretty well since our last session.  Pt shares she continues to enjoy having [place] and [place] in her home with her and her mom.  Pt shares that her mom (32 yo) is doing OK; the new BP medication is working better; her mom had her annual physical on August 19th and it went well.  Pt has to stay on her mom about taking her shower regularly; pt has noticed several differences in her mom lately because of her dementia; her mom is not eating well if pt is not there at the time.  Encouraged pt to give her mom some grace in this area and just pay attention to her weight to see if she is maintaining.  Pt shares that her mom continues to go to the [facility] program and enjoys that.  Pt shares that Johnetta is doing well  in school so far this semester.  She is trying to decide what she wants to do after her education.  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  Pt shares that Elsie is living with a woman in a tent on [address] and he seems to be managing OK right now.  Pt shares that her nephew and his wife are pregnant and have moved to [facility] in late May.  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  Pt shares she continues to sleep well although she normally wakes up at least once per night.  Encouraged pt to continue with her self care activities and we will meet in 8 wks for a follow up session.  Interventions: Cognitive Behavioral Therapy  Diagnosis:Adjustment disorder with mixed anxiety and depressed mood  Plan: Treatment Plan Strengths/Abilities:  Intelligent, Intuitive, Willing to participate in therapy Treatment Preferences:  Outpatient Individual Therapy Statement of Needs:  Patient is to use CBT, mindfulness and coping skills to help manage and/or decrease symptoms associated with their diagnosis. Symptoms:  Depressed/Irritable mood, worry, social withdrawal Problems Addressed:  Depressive thoughts, Sadness, Sleep issues, etc. Long Term Goals:  Pt to reduce overall level, frequency, and intensity of the  feelings of depression/anxiety as evidenced by decreased irritability, negative self talk, and helpless feelings from 6 to 7 days/week to 0 to 1 days/week, per client report, for at least 3 consecutive months.  Progress: 30% Short Term Goals:  Pt to verbally express understanding of the relationship between feelings of depression/anxiety and their impact on thinking patterns and behaviors.  Pt to verbalize an understanding of the role that distorted thinking plays in creating fears, excessive worry, and ruminations.  Progress: 30% Target Date:   07/29/2024 Frequency:  Bi-weekly Modality:  Cognitive Behavioral Therapy Interventions by Therapist:  Therapist will use CBT, Mindfulness exercises, Coping skills and Referrals, as needed by client. Client has verbally approved this treatment plan.  Francis KATHEE Macintosh, [facility]

## 2024-03-01 ENCOUNTER — Encounter: Admitting: Family Medicine

## 2024-03-07 ENCOUNTER — Encounter: Payer: Self-pay | Admitting: Family Medicine

## 2024-03-07 ENCOUNTER — Ambulatory Visit (INDEPENDENT_AMBULATORY_CARE_PROVIDER_SITE_OTHER): Admitting: Family Medicine

## 2024-03-07 VITALS — BP 118/78 | HR 81 | Temp 98.4°F | Ht 65.75 in | Wt 164.6 lb

## 2024-03-07 DIAGNOSIS — E782 Mixed hyperlipidemia: Secondary | ICD-10-CM

## 2024-03-07 DIAGNOSIS — M79644 Pain in right finger(s): Secondary | ICD-10-CM

## 2024-03-07 DIAGNOSIS — Z853 Personal history of malignant neoplasm of breast: Secondary | ICD-10-CM

## 2024-03-07 DIAGNOSIS — Z0000 Encounter for general adult medical examination without abnormal findings: Secondary | ICD-10-CM

## 2024-03-07 DIAGNOSIS — R7303 Prediabetes: Secondary | ICD-10-CM | POA: Diagnosis not present

## 2024-03-07 NOTE — Progress Notes (Signed)
 Established Patient Office Visit   Subjective  Patient ID: Olivia Acosta, female    DOB: 01-14-1961  Age: 63 y.o. MRN: [id number]  Chief Complaint  Patient presents with   Annual Exam    Pain right hand finger     Pt is a 63 yo female seen for CPE.  Pt doing well overall.  Mentions an intermittent pruritic rash on posterior R shoulder.  Occurs may 2-3 x in the last yr.  Uses an OTC topical steroid on it which helps.  Thinks maybe due to h/o XRT in 2017.   Pt also notes increased pain in R 2nd digit that wakes her up at night and increased edema and deformity of joints in that finger.  Stiff in am.  Ok during the day.  Pt thinks she had a bone density scan last yr with Ob/Gyn.  Plans to get flu and COVID vaccines at local pharmacy. Inquires about pna vaccine.    Patient Active Problem List   Diagnosis Date Noted   Acquired absence of breast 11/17/2020   Breast pain 08/21/2020   Breast implant capsular contracture 08/21/2020   Acquired hypothyroidism 11/21/2017   Weight gain 11/21/2017   Gastroesophageal reflux disease 11/21/2017   Left foot pain 09/19/2016   Breast cancer in female [facility]) 07/23/2015   Breast cancer, right breast (HCC) 06/04/2015   Fever 09/14/2014   Leukopenia 09/14/2014   Flu-like symptoms 09/14/2014   Acute bronchitis 09/14/2014   Breast cancer of lower-outer quadrant of right female breast (HCC) 04/17/2014   DEPRESSION 08/03/2007   SWELLING MASS OR LUMP IN HEAD AND NECK 08/03/2007   Past Medical History:  Diagnosis Date   Arthritis    fingers stiff   Breast cancer (HCC)    R breast- 2015   Bronchitis 2016   treated /w IV antibiotics, admitted for 2 nights [facility]   GERD (gastroesophageal reflux disease)    Neuropathy    both feet since chemotherapy   Thyroid  disease    UTI (urinary tract infection)    Past Surgical History:  Procedure Laterality Date   BREAST RECONSTRUCTION Bilateral 04/17/2014   BREAST RECONSTRUCTION Right 07/23/2015    Procedure: RIGHT CHEST WOUND DEBRIDEMENT, RIGHT BREAST RECONSTRUCTION WITH SILICONE GEL IMPLANT;  Surgeon: Alm Sick, MD;  Location: [facility] OR;  Service: Plastics;  Laterality: Right;   BREAST RECONSTRUCTION WITH PLACEMENT OF TISSUE EXPANDER AND FLEX HD (ACELLULAR HYDRATED DERMIS) Bilateral 04/17/2014   Procedure: BILATERAL BREAST RECONSTRUCTION WITH PLACEMENT OF TISSUE EXPANDER AND FLEX HD (ACELLULAR HYDRATED DERMIS);  Surgeon: Alm Sick, MD;  Location: [facility] OR;  Service: Plastics;  Laterality: Bilateral;   COMPLETE MASTECTOMY W/ SENTINEL NODE BIOPSY Bilateral 04/17/2014   NIPPLE SPARING RECONSTRUCTION   LATISSIMUS FLAP TO BREAST Right 07/23/2015   Procedure: LATISSIMUS FLAP TO RIGHT CHEST;  Surgeon: Alm Sick, MD;  Location: [facility] OR;  Service: Plastics;  Laterality: Right;   MASTECTOMY W/ SENTINEL NODE BIOPSY Bilateral 04/17/2014   Procedure: BILATERAL NIPPLE SPARING MASTECTOMY WITH SENTINEL LYMPH NODE BIOPSY;  Surgeon: Morene Olives, MD;  Location: [facility] OR;  Service: General;  Laterality: Bilateral;   PORT A CATH REVISION Right 05/27/2014   Procedure: PORT A CATH REVISION;  Surgeon: Morene Olives, MD;  Location: [facility];  Service: General;  Laterality: Right;   PORT-A-CATH REMOVAL Right 06/04/2015   Procedure: REMOVAL PORT-A-CATH;  Surgeon: Alm Sick, MD;  Location: [facility] OR;  Service: Plastics;  Laterality: Right;   PORTACATH PLACEMENT Left 05/27/2014   Procedure: INSERTION  PORT-A-CATH;  Surgeon: Morene Olives, MD;  Location: [facility];  Service: General;  Laterality: Left;   REMOVAL OF TISSUE EXPANDER AND PLACEMENT OF IMPLANT Bilateral 06/04/2015   Procedure: REMOVAL OF BILATERAL TISSUE EXPANDER AND DELAYED BREAST RECONSTRUCTION PLACEMENT OF IMPLANTS;  Surgeon: Alm Sick, MD;  Location: [facility] OR;  Service: Plastics;  Laterality: Bilateral;   SALIVARY STONE REMOVAL  2009   stricture of the uretha  1975   TONSILLECTOMY     TONSILLECTOMY AND  ADENOIDECTOMY  1974   URETHRAL STRICTURE DILATATION     Social History   Tobacco Use   Smoking status: Never   Smokeless tobacco: Never  Substance Use Topics   Alcohol use: Yes    Comment: occ wine   Drug use: No   Family History  Problem Relation Age of Onset   Miscarriages / Stillbirths Mother    Cancer Father    COPD Father    Hyperlipidemia Father    Cancer Maternal Grandmother    Cancer Paternal Grandmother    ADD / ADHD Paternal Grandmother    Kidney disease Brother    Stroke Brother    Depression Daughter    Drug abuse Daughter    Depression Son    Drug abuse Son    Heart attack Maternal Grandfather    ADD / ADHD Paternal Grandfather    Allergies  Allergen Reactions   Ciprofloxacin  Other (See Comments)    Arm that the iv was given in begin to swell   Penicillins Hives and Itching    Has patient had a PCN reaction causing immediate rash, facial/tongue/throat swelling, SOB or lightheadedness with hypotension: Yes Has patient had a PCN reaction causing severe rash involving mucus membranes or skin necrosis: No Has patient had a PCN reaction that required hospitalization: No Has patient had a PCN reaction occurring within the last 10 years: No If all of the above answers are NO, then may proceed with Cephalosporin use.     ROS Negative unless stated above    Objective:     BP 118/78 (BP Location: Left Arm, Patient Position: Sitting, Cuff Size: Normal)   Pulse 81   Temp 98.4 F (36.9 C) (Oral)   Ht 5' 5.75 (1.67 m)   Wt 164 lb 9.6 oz (74.7 kg)   LMP 05/28/2011   SpO2 96%   BMI 26.77 kg/m  BP Readings from Last 3 Encounters:  03/07/24 118/78  12/02/22 110/68  11/25/21 130/79   Wt Readings from Last 3 Encounters:  03/07/24 164 lb 9.6 oz (74.7 kg)  12/02/22 156 lb (70.8 kg)  11/25/21 169 lb 4.8 oz (76.8 kg)      Physical Exam Constitutional:      Appearance: Normal appearance.  HENT:     Head: Normocephalic and atraumatic.     Right Ear:  Tympanic membrane, ear canal and external ear normal.     Left Ear: Tympanic membrane, ear canal and external ear normal.     Nose: Nose normal.     Mouth/Throat:     Mouth: Mucous membranes are moist.     Pharynx: No oropharyngeal exudate or posterior oropharyngeal erythema.  Eyes:     General: No scleral icterus.    Extraocular Movements: Extraocular movements intact.     Conjunctiva/sclera: Conjunctivae normal.     Pupils: Pupils are equal, round, and reactive to light.  Neck:     Thyroid : No thyromegaly.     Vascular: No carotid bruit.  Cardiovascular:  Rate and Rhythm: Normal rate and regular rhythm.     Pulses: Normal pulses.     Heart sounds: Normal heart sounds. No murmur heard.    No friction rub.  Pulmonary:     Effort: Pulmonary effort is normal.     Breath sounds: Normal breath sounds. No wheezing, rhonchi or rales.  Abdominal:     General: Bowel sounds are normal.     Palpations: Abdomen is soft.     Tenderness: There is no abdominal tenderness.  Musculoskeletal:        General: No deformity. Normal range of motion.     Comments: R 2nd digit edema and deformity of DIP and PIP jts> L 2nd digit.  Lymphadenopathy:     Cervical: No cervical adenopathy.  Skin:    General: Skin is warm and dry.     Findings: No lesion.  Neurological:     General: No focal deficit present.     Mental Status: She is alert and oriented to person, place, and time.  Psychiatric:        Mood and Affect: Mood normal.        Thought Content: Thought content normal.        03/07/2024    5:03 PM 12/02/2022    9:04 AM 09/14/2020    9:21 AM  Depression screen PHQ 2/9  Decreased Interest 0 0 0  Down, Depressed, Hopeless 0 0 0  PHQ - 2 Score 0 0 0  Altered sleeping 0 0 0  Tired, decreased energy 0 0 0  Change in appetite 0 0 0  Feeling bad or failure about yourself  0 0 0  Trouble concentrating  0 0  Moving slowly or fidgety/restless 0 0 0  Suicidal thoughts 0 0 0  PHQ-9 Score 0 0  0      03/07/2024    5:03 PM 12/02/2022    9:04 AM 09/14/2020    9:21 AM  GAD 7 : Generalized Anxiety Score  Nervous, Anxious, on Edge 0 0 0  Control/stop worrying 0 0 0  Worry too much - different things 0 0 1  Trouble relaxing 0 0 0  Restless 0 0 0  Easily annoyed or irritable 0 0 0  Afraid - awful might happen 0 0 0  Total GAD 7 Score 0 0 1     No results found for any visits on 03/07/24.    Assessment & Plan:   Well adult exam -     CBC with Differential/Platelet; Future -     Comprehensive metabolic panel with GFR; Future -     Hemoglobin A1c; Future -     Lipid panel; Future -     T4, free; Future -     TSH; Future -     VITAMIN D 25 Hydroxy (Vit-D Deficiency, Fractures); Future  Prediabetes -     Hemoglobin A1c; Future  Mixed hyperlipidemia -     Lipid panel; Future  History of breast cancer -     VITAMIN D 25 Hydroxy (Vit-D Deficiency, Fractures); Future  Pain of finger of right hand -     CBC with Differential/Platelet; Future -     VITAMIN D 25 Hydroxy (Vit-D Deficiency, Fractures); Future -     DG Hand Complete Right; Future -     Rheumatoid factor; Future  Age appropriate health screenings discussed.  Obtain labs later this wk when fasting.  Immunizations reviewed.  To obtian flu and COVID at local  pharmacy.  Advised to get pna vaccine.  Colonoscopy done 01/13/23.  S/p b/l mastectomy.  Followed by OB/Gyn, Dr. Celesta for pap.  Xray of R hand for OA vs RA.  Supportive care.  Next cpe in 1 yr.    Return if symptoms worsen or fail to improve.   Clotilda JONELLE Single, MD

## 2024-03-08 ENCOUNTER — Other Ambulatory Visit

## 2024-04-09 DIAGNOSIS — E063 Autoimmune thyroiditis: Secondary | ICD-10-CM | POA: Diagnosis not present

## 2024-04-09 DIAGNOSIS — E039 Hypothyroidism, unspecified: Secondary | ICD-10-CM | POA: Diagnosis not present

## 2024-04-09 DIAGNOSIS — E559 Vitamin D deficiency, unspecified: Secondary | ICD-10-CM | POA: Diagnosis not present

## 2024-04-09 DIAGNOSIS — R7309 Other abnormal glucose: Secondary | ICD-10-CM | POA: Diagnosis not present

## 2024-04-09 DIAGNOSIS — E538 Deficiency of other specified B group vitamins: Secondary | ICD-10-CM | POA: Diagnosis not present

## 2024-04-17 ENCOUNTER — Encounter: Payer: Self-pay | Admitting: Family Medicine

## 2024-04-17 ENCOUNTER — Ambulatory Visit: Admitting: Family Medicine

## 2024-04-17 ENCOUNTER — Ambulatory Visit: Admitting: Psychology

## 2024-04-17 VITALS — BP 96/60 | HR 75 | Temp 98.0°F | Ht 65.57 in | Wt 162.2 lb

## 2024-04-17 DIAGNOSIS — R42 Dizziness and giddiness: Secondary | ICD-10-CM

## 2024-04-17 DIAGNOSIS — F4323 Adjustment disorder with mixed anxiety and depressed mood: Secondary | ICD-10-CM

## 2024-04-17 DIAGNOSIS — J302 Other seasonal allergic rhinitis: Secondary | ICD-10-CM | POA: Diagnosis not present

## 2024-04-17 NOTE — Progress Notes (Signed)
 Established Patient Office Visit   Subjective  Patient ID: Olivia Acosta, female    DOB: [birth year]  Age: 63 y.o. MRN: [id number]  Chief Complaint  Patient presents with   Acute Visit    Ear pressure started 2 weeks ago, left ear has fluid leaking and right ear is ringing, vertigo, runny nose     Pt is a 63 yo female seen for ongoing concern.  Pt with L ear pressure, dizziness, and rhinorrhea.  Dizziness started 2 wks ago after laying down at [employer].  Pt then developed tinnitus. Noticed drainage from ear.  Rhinorrhea started after raking leaves.  Denies fever, facial pain/pressure, ear pain, HA, n/v, sore throat, cough.  Taking allegra-d x 4 days.  Didn't take flonase due to running out.    Patient Active Problem List   Diagnosis Date Noted   Acquired absence of breast 11/17/2020   Breast pain 08/21/2020   Breast implant capsular contracture 08/21/2020   Acquired hypothyroidism 11/21/2017   Weight gain 11/21/2017   Gastroesophageal reflux disease 11/21/2017   Left foot pain 09/19/2016   Breast cancer in female [facility]) 07/23/2015   Breast cancer, right breast (HCC) 06/04/2015   Fever 09/14/2014   Leukopenia 09/14/2014   Flu-like symptoms 09/14/2014   Acute bronchitis 09/14/2014   Breast cancer of lower-outer quadrant of right female breast (HCC) 04/17/2014   DEPRESSION 08/03/2007   SWELLING MASS OR LUMP IN HEAD AND NECK 08/03/2007   Past Medical History:  Diagnosis Date   Arthritis    fingers stiff   Breast cancer (HCC)    R breast- 2015   Bronchitis 2016   treated /w IV antibiotics, admitted for 2 nights [facility]   GERD (gastroesophageal reflux disease)    Neuropathy    both feet since chemotherapy   Thyroid  disease    UTI (urinary tract infection)    Past Surgical History:  Procedure Laterality Date   BREAST RECONSTRUCTION Bilateral 04/17/2014   BREAST RECONSTRUCTION Right 07/23/2015   Procedure: RIGHT CHEST WOUND DEBRIDEMENT, RIGHT BREAST RECONSTRUCTION  WITH SILICONE GEL IMPLANT;  Surgeon: Alm Sick, MD;  Location: [facility] OR;  Service: Plastics;  Laterality: Right;   BREAST RECONSTRUCTION WITH PLACEMENT OF TISSUE EXPANDER AND FLEX HD (ACELLULAR HYDRATED DERMIS) Bilateral 04/17/2014   Procedure: BILATERAL BREAST RECONSTRUCTION WITH PLACEMENT OF TISSUE EXPANDER AND FLEX HD (ACELLULAR HYDRATED DERMIS);  Surgeon: Alm Sick, MD;  Location: [facility] OR;  Service: Plastics;  Laterality: Bilateral;   COMPLETE MASTECTOMY W/ SENTINEL NODE BIOPSY Bilateral 04/17/2014   NIPPLE SPARING RECONSTRUCTION   LATISSIMUS FLAP TO BREAST Right 07/23/2015   Procedure: LATISSIMUS FLAP TO RIGHT CHEST;  Surgeon: Alm Sick, MD;  Location: [facility] OR;  Service: Plastics;  Laterality: Right;   MASTECTOMY W/ SENTINEL NODE BIOPSY Bilateral 04/17/2014   Procedure: BILATERAL NIPPLE SPARING MASTECTOMY WITH SENTINEL LYMPH NODE BIOPSY;  Surgeon: Morene Olives, MD;  Location: [facility] OR;  Service: General;  Laterality: Bilateral;   PORT A CATH REVISION Right 05/27/2014   Procedure: PORT A CATH REVISION;  Surgeon: Morene Olives, MD;  Location: [facility];  Service: General;  Laterality: Right;   PORT-A-CATH REMOVAL Right 06/04/2015   Procedure: REMOVAL PORT-A-CATH;  Surgeon: Alm Sick, MD;  Location: [facility] OR;  Service: Plastics;  Laterality: Right;   PORTACATH PLACEMENT Left 05/27/2014   Procedure: INSERTION PORT-A-CATH;  Surgeon: Morene Olives, MD;  Location: [facility];  Service: General;  Laterality: Left;   REMOVAL OF TISSUE EXPANDER AND PLACEMENT OF IMPLANT Bilateral 06/04/2015  Procedure: REMOVAL OF BILATERAL TISSUE EXPANDER AND DELAYED BREAST RECONSTRUCTION PLACEMENT OF IMPLANTS;  Surgeon: Alm Sick, MD;  Location: [facility] OR;  Service: Plastics;  Laterality: Bilateral;   SALIVARY STONE REMOVAL  2009   stricture of the uretha  1975   TONSILLECTOMY     TONSILLECTOMY AND ADENOIDECTOMY  1974   URETHRAL STRICTURE DILATATION     Social History    Tobacco Use   Smoking status: Never   Smokeless tobacco: Never  Substance Use Topics   Alcohol use: Yes    Comment: occ wine   Drug use: No   Family History  Problem Relation Age of Onset   Miscarriages / Stillbirths Mother    Cancer Father    COPD Father    Hyperlipidemia Father    Cancer Maternal Grandmother    Cancer Paternal Grandmother    ADD / ADHD Paternal Grandmother    Kidney disease Brother    Stroke Brother    Depression Daughter    Drug abuse Daughter    Depression Son    Drug abuse Son    Heart attack Maternal Grandfather    ADD / ADHD Paternal Grandfather    Allergies  Allergen Reactions   Ciprofloxacin  Other (See Comments)    Arm that the iv was given in begin to swell   Penicillins Hives and Itching    Has patient had a PCN reaction causing immediate rash, facial/tongue/throat swelling, SOB or lightheadedness with hypotension: Yes Has patient had a PCN reaction causing severe rash involving mucus membranes or skin necrosis: No Has patient had a PCN reaction that required hospitalization: No Has patient had a PCN reaction occurring within the last 10 years: No If all of the above answers are NO, then may proceed with Cephalosporin use.     ROS Negative unless stated above    Objective:     BP 96/60 (BP Location: Left Arm, Patient Position: Sitting, Cuff Size: Large)   Pulse 75   Temp 98 F (36.7 C) (Oral)   Ht 5' 5.57 (1.665 m)   Wt 162 lb 3.2 oz (73.6 kg)   LMP 05/28/2011   SpO2 98%   BMI 26.52 kg/m  BP Readings from Last 3 Encounters:  04/17/24 96/60  03/07/24 118/78  12/02/22 110/68   Wt Readings from Last 3 Encounters:  04/17/24 162 lb 3.2 oz (73.6 kg)  03/07/24 164 lb 9.6 oz (74.7 kg)  12/02/22 156 lb (70.8 kg)      Physical Exam Constitutional:      General: She is not in acute distress.    Appearance: Normal appearance.  HENT:     Head: Normocephalic and atraumatic.     Right Ear: Hearing, ear canal and external  ear normal. No drainage or swelling. No middle ear effusion. There is no impacted cerumen.     Left Ear: Hearing, ear canal and external ear normal. No drainage or swelling.  No middle ear effusion. There is no impacted cerumen.     Ears:     Comments: No discomfort with movement of external ear.  TMs full b/l without erythema, effusion, suppurative fluid, or perforation.      Nose: Mucosal edema, congestion and rhinorrhea present. Rhinorrhea is clear.     Left Turbinates: Enlarged and swollen.     Right Sinus: No maxillary sinus tenderness or frontal sinus tenderness.     Left Sinus: No maxillary sinus tenderness or frontal sinus tenderness.     Comments: L nares extremely narrow  Mouth/Throat:     Mouth: Mucous membranes are moist.  Cardiovascular:     Rate and Rhythm: Normal rate and regular rhythm.     Heart sounds: Normal heart sounds. No murmur heard.    No gallop.  Pulmonary:     Effort: Pulmonary effort is normal. No respiratory distress.     Breath sounds: Normal breath sounds. No wheezing, rhonchi or rales.  Skin:    General: Skin is warm and dry.  Neurological:     Mental Status: She is alert and oriented to person, place, and time.        04/17/2024    4:14 PM 03/07/2024    5:03 PM 12/02/2022    9:04 AM  Depression screen PHQ 2/9  Decreased Interest 0 0 0  Down, Depressed, Hopeless 0 0 0  PHQ - 2 Score 0 0 0  Altered sleeping 0 0 0  Tired, decreased energy 0 0 0  Change in appetite 0 0 0  Feeling bad or failure about yourself  0 0 0  Trouble concentrating 0  0  Moving slowly or fidgety/restless 0 0 0  Suicidal thoughts 0 0 0  PHQ-9 Score 0 0  0      Data saved with a previous flowsheet row definition      04/17/2024    4:15 PM 03/07/2024    5:03 PM 12/02/2022    9:04 AM 09/14/2020    9:21 AM  GAD 7 : Generalized Anxiety Score  Nervous, Anxious, on Edge 0 0 0 0  Control/stop worrying 0 0 0 0  Worry too much - different things 0 0 0 1  Trouble relaxing 0  0 0 0  Restless 0 0 0 0  Easily annoyed or irritable 0 0 0 0  Afraid - awful might happen 0 0 0 0  Total GAD 7 Score 0 0 0 1  Anxiety Difficulty Not difficult at all        No results found for any visits on 04/17/24.    Assessment & Plan:   Seasonal allergic rhinitis, unspecified trigger  Dizziness  Dizziness likely started due to vertigo vs orthostatic hypotension as bp low normal this visit (baseline 1teens).   Dizziness continued with rhinorrhea and ear pressure likely from allergic rhinitis after doing yard work.  No AOM or sinusitis noted.  Discussed supportive care including hydration, antihistamines.  Start flonase.  OTC meds as needed for dizziness.  Return if symptoms worsen or fail to improve.   Clotilda JONELLE Single, MD

## 2024-04-17 NOTE — Progress Notes (Signed)
 [facility] Counselor/Therapist Progress Note  Patient ID: Olivia Acosta, MRN: [id number],    Date: 04/17/2024  Time Spent: 60 mins  start time: 1100    end time:  1200  Treatment Type: Individual Therapy  Reported Symptoms: Pt presented for follow up session, via Caregility video.  Pt granted consent for session, stating she is in her home with no one else present; pt also understands the limits of virtual sessions.  I shared with pt that I am in my office with no one else here.   Mental Status Exam: Appearance:  Casual     Behavior: Appropriate  Motor: Normal  Speech/Language:  Clear and Coherent  Affect: Appropriate  Mood: normal  Thought process: normal  Thought content:   WNL  Sensory/Perceptual disturbances:   WNL  Orientation: oriented to person, place, and time/date  Attention: Good  Concentration: Good  Memory: WNL  Fund of knowledge:  Good  Insight:   Good  Judgment:  Good  Impulse Control: Good   Risk Assessment: Danger to Self:  No Self-injurious Behavior: No Danger to Others: No Duty to Warn:no Physical Aggression / Violence:No  Access to Firearms a concern: No  Gang Involvement:No   Notified: Yes  Subjective: Pt shares that I have been doing pretty well since our last session.  My mom fell the day after our last session; I was picking her up at her [facility] program and she was trying to get in the car and fell in the grass.  She did not have any major injuries and I was thankful for that.  Pt's mom has lived with pt for three years this past October.  I don't think she moves enough, even though she goes to her program three days per week.   Pt shares she continues to enjoy having [place] in her home with her and her mom.  Pt shares that her nephew came to visit them a couple of weeks ago and he was glad to see them.  Pt shares that her mom is continuing to have more memory issues and is less stabile in her walking.  Pt  will begin looking at memory care units and assisted living facilities beginning in January 2026.  Pt shares that Elsie is back in jail; he was living in a tent on [address] with a girl and the girl called the cops and told them he had kidnapped her and was holding her at [employer]; he got arrested and taken to jail; his court date is 12/5.  Pt knows that she cannot help him at this point; Olivia Acosta is paying for a cell phone line for him and feels like she needs to help him.  Olivia Acosta continues to take classes at [employer] and is doing well in them.  She continues to work at [employer] on [employer].  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  Pt shares she continues to sleep well although she normally wakes up at least once per night.  Encouraged pt to continue with her self care activities; pt will call the office to schedule a follow up appt.  Interventions: Cognitive Behavioral Therapy  Diagnosis:Adjustment disorder with mixed anxiety and depressed mood  Plan: Treatment Plan Strengths/Abilities:  Intelligent, Intuitive, Willing to participate in therapy Treatment Preferences:  Outpatient Individual Therapy Statement of Needs:  Patient is to use CBT, mindfulness and coping skills to help manage and/or decrease symptoms associated with their diagnosis. Symptoms:  Depressed/Irritable mood, worry, social withdrawal Problems Addressed:  Depressive thoughts, Sadness, Sleep issues, etc. Long Term Goals:  Pt to reduce overall level, frequency, and intensity of the feelings of depression/anxiety as evidenced by decreased irritability, negative self talk, and helpless feelings from 6 to 7 days/week to 0 to 1 days/week, per client report, for at least 3 consecutive months.  Progress: 30% Short Term Goals:  Pt to verbally express understanding of the relationship between feelings of  depression/anxiety and their impact on thinking patterns and behaviors.  Pt to verbalize an understanding of the role that distorted thinking plays in creating fears, excessive worry, and ruminations.  Progress: 30% Target Date:  07/29/2024 Frequency:  Bi-weekly Modality:  Cognitive Behavioral Therapy Interventions by Therapist:  Therapist will use CBT, Mindfulness exercises, Coping skills and Referrals, as needed by client. Client has verbally approved this treatment plan.  Francis KATHEE Macintosh, [facility]
